# Patient Record
Sex: Female | Born: 1965 | Race: Black or African American | Hispanic: No | Marital: Single | Smoking: Former smoker
Health system: Southern US, Community
[De-identification: ages and names within clinical notes are randomized; demographics above are authoritative.]

## PROBLEM LIST (undated history)

## (undated) DIAGNOSIS — J302 Other seasonal allergic rhinitis: Secondary | ICD-10-CM

## (undated) DIAGNOSIS — K219 Gastro-esophageal reflux disease without esophagitis: Secondary | ICD-10-CM

## (undated) DIAGNOSIS — M329 Systemic lupus erythematosus, unspecified: Secondary | ICD-10-CM

## (undated) DIAGNOSIS — J8410 Pulmonary fibrosis, unspecified: Secondary | ICD-10-CM

## (undated) DIAGNOSIS — D472 Monoclonal gammopathy: Secondary | ICD-10-CM

## (undated) HISTORY — DX: Systemic lupus erythematosus, unspecified: M32.9

## (undated) HISTORY — DX: Gastro-esophageal reflux disease without esophagitis: K21.9

## (undated) HISTORY — DX: Pulmonary fibrosis, unspecified: J84.10

## (undated) HISTORY — PX: MULTIPLE TOOTH EXTRACTIONS: SHX2053

## (undated) HISTORY — DX: Monoclonal gammopathy: D47.2

## (undated) HISTORY — PX: HERNIA REPAIR: SHX51

## (undated) HISTORY — PX: CHOLECYSTECTOMY: SHX55

## (undated) HISTORY — DX: Other seasonal allergic rhinitis: J30.2

---

## 2008-06-29 DIAGNOSIS — M329 Systemic lupus erythematosus, unspecified: Secondary | ICD-10-CM

## 2008-06-29 HISTORY — DX: Systemic lupus erythematosus, unspecified: M32.9

## 2011-08-11 ENCOUNTER — Other Ambulatory Visit (HOSPITAL_COMMUNITY): Payer: Self-pay | Admitting: Rheumatology

## 2011-08-11 ENCOUNTER — Ambulatory Visit (HOSPITAL_COMMUNITY)
Admission: RE | Admit: 2011-08-11 | Discharge: 2011-08-11 | Disposition: A | Discharge status: Home / Self Care | Payer: BC Managed Care – PPO | Source: Ambulatory Visit | Attending: Rheumatology | Admitting: Rheumatology

## 2011-08-11 DIAGNOSIS — R0602 Shortness of breath: Secondary | ICD-10-CM | POA: Insufficient documentation

## 2011-08-11 DIAGNOSIS — Z0189 Encounter for other specified special examinations: Secondary | ICD-10-CM

## 2011-12-01 ENCOUNTER — Encounter (HOSPITAL_COMMUNITY): Payer: BC Managed Care – PPO | Attending: Oncology

## 2011-12-01 ENCOUNTER — Encounter (HOSPITAL_COMMUNITY): Payer: Self-pay

## 2011-12-01 ENCOUNTER — Ambulatory Visit (HOSPITAL_COMMUNITY)
Admission: RE | Admit: 2011-12-01 | Discharge: 2011-12-01 | Disposition: A | Discharge status: Home / Self Care | Payer: BC Managed Care – PPO | Source: Ambulatory Visit | Attending: Oncology | Admitting: Oncology

## 2011-12-01 VITALS — BP 155/96 | HR 55 | Temp 98.1°F | Ht 62.0 in | Wt 127.3 lb

## 2011-12-01 DIAGNOSIS — Z79899 Other long term (current) drug therapy: Secondary | ICD-10-CM | POA: Insufficient documentation

## 2011-12-01 DIAGNOSIS — IMO0002 Reserved for concepts with insufficient information to code with codable children: Secondary | ICD-10-CM | POA: Insufficient documentation

## 2011-12-01 DIAGNOSIS — Z7982 Long term (current) use of aspirin: Secondary | ICD-10-CM | POA: Insufficient documentation

## 2011-12-01 DIAGNOSIS — M069 Rheumatoid arthritis, unspecified: Secondary | ICD-10-CM | POA: Insufficient documentation

## 2011-12-01 DIAGNOSIS — R911 Solitary pulmonary nodule: Secondary | ICD-10-CM

## 2011-12-01 DIAGNOSIS — D472 Monoclonal gammopathy: Secondary | ICD-10-CM | POA: Insufficient documentation

## 2011-12-01 DIAGNOSIS — M5030 Other cervical disc degeneration, unspecified cervical region: Secondary | ICD-10-CM | POA: Insufficient documentation

## 2011-12-01 DIAGNOSIS — M329 Systemic lupus erythematosus, unspecified: Secondary | ICD-10-CM | POA: Insufficient documentation

## 2011-12-01 DIAGNOSIS — J8410 Pulmonary fibrosis, unspecified: Secondary | ICD-10-CM | POA: Insufficient documentation

## 2011-12-01 HISTORY — DX: Monoclonal gammopathy: D47.2

## 2011-12-01 NOTE — Progress Notes (Signed)
[name]  [phone] [phone]     INITIAL HEMATOLOGY CONSULTATION    Referral MD:  Dr. Delma Officer, M.D.   Reason for Referral: positive M-spike.     HPI: Kari Henson is a 46 year-old [place] American woman with history of SLE.  She had symptoms of wrist pain and skin rash in 2008.  She was diagnosed in 2010.  She was on MTX until 08/2011 when it was discontinued due to cytopenia, anorexia, weight loss.  She has been on Prednisone and Plaquenil.  She recently was found to have a routine myeloma work up that is routinely performed.  Her SPEP was negative; however, IFIX showed positive IgM.  She was thus kindly referred to the [facility] for evaluation.  Dr. Tressie Stalker was out of the office today; and I saw the patient in his absence.    Kari Henson presented to the clinic today with her mother.  She reported that her bilateral wrists and ankles pain have significantly improved. 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  Being off of work and on medications, these symptoms have significantly improved.  She no longer has any more skin rash on the back or her face.  She used to have nausea/vomiting and weight loss on MTX which have completely resolved being off of MTX.  She has occasional cough with productive phlegm which was attributed to her history of smoking up until 3 months ago.  She denied fever, SOB, DOE, chest pain, hemoptysis.    Patient denies fatigue, headache, visual changes, confusion, drenching night sweats, palpable lymph node swelling, mucositis, odynophagia, dysphagia, nausea vomiting, jaundice, gum bleeding, epistaxis, hematemesis, hemoptysis, abdominal pain, abdominal swelling, early satiety, melena, hematochezia, hematuria, skin rash, spontaneous bleeding, joint swelling, joint pain, heat or cold intolerance, bowel bladder incontinence, back pain, focal motor weakness, paresthesia, depression,  suicidal or homocidal ideation, feeling hopelessness.   Past Medical History  Diagnosis Date  . SLE (systemic lupus erythematosus) 2010    she was on MTX (d/c due to cytopenia).  She has been on Prednisone and Plaquenil  . Seasonal allergies   . 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 Lung nodule     follows with a Dr. Ouida Sills, pulmonary in [place], [place]  :    Past Surgical History  Procedure Date  . Cesarean section   . Hernia repair   . Multiple tooth extractions   :   CURRENT MEDS: Current Outpatient Prescriptions  Medication Sig Dispense Refill  . aspirin EC 81 MG tablet Take 81 mg by mouth daily.      . folic acid (FOLVITE) 1 MG tablet Take 2 mg by mouth daily. Take two 19m tablets daily      . hydroxychloroquine (PLAQUENIL) 200 MG tablet Take 200 mg by mouth 2 (two) times daily. Take one tablet in the am and 1/2 tablet in the pm      . predniSONE (DELTASONE) 5 MG tablet Take 5 mg by mouth daily.          No Known Allergies:  Family History  Problem Relation Age of Onset  . Asthma Mother   . Thyroid disease Mother   . Diabetes Mother   . COPD Mother   . Hypertension Mother   . Cirrhosis Father   . Alcohol abuse Father   . Diabetes Maternal Aunt   . Diabetes Maternal Uncle   . Diabetes Paternal  Aunt   . Hypertension Maternal Grandmother   . COPD Maternal Grandmother   . Arthritis Maternal Grandmother     rheumatoid arthritis  . Hypertension Sister   :  History   Social History  . Marital Status: Single    Spouse Name: N/A    Number of Children: 1  . Years of Education: N/A   Occupational History  .      used to work in [employer]; now applying for disability due to [place] History Main Topics  . Smoking status: Former Smoker -- 20 years    Quit date: 08/31/2011  . Smokeless tobacco: Never Used  . Alcohol Use: No  . Drug Use: No  . Sexually Active: Yes   Other Topics Concern  . Not on file   Social History Narrative    . No narrative on file  :  REVIEW OF SYSTEM:  The rest of the 14-point review of sytem was negative.   Exam: ECOG 0.   General:  Thin-appearing woman, in no acute distress.  Eyes:  no scleral icterus.  ENT:  There were no oropharyngeal lesions.  Neck was without thyromegaly.  Lymphatics:  Negative cervical, supraclavicular or axillary adenopathy.  Respiratory: lungs were clear bilaterally without wheezing or crackles.  Cardiovascular:  Regular rate and rhythm, S1/S2, without murmur, rub or gallop.  There was no pedal edema.  GI:  abdomen was soft, flat, nontender, nondistended, without organomegaly.  Muscoloskeletal:  no spinal tenderness of palpation of vertebral spine. There was no swelling or pain to palpation of her hands.  Skin exam was without echymosis, petichae.  There was hyperpigmented rash that had healed on her back and upper lip.   Neuro exam was nonfocal.  Patient was able to get on and off exam table without assistance.  Gait was normal.  Patient was alerted and oriented.  Attention was good.   Language was appropriate.  Mood was normal without depression.  Speech was not pressured.  Thought content was not tangential.    LABS:  From Dr. Arlean Hopping offfice:  ESR 66 mm/hr (ref 0-22) 11/04/11 Cr 0.56 mg/dL (ref 0.6-1.3) 10/16/11 WBC 3.8; Hgb 11.7; plt 310 (10/16/11) SPEP:  Negative; IFIX positive for IgM monoclonal protein with kappa light chain restriction (08/26/11).  Ca 9.8 mg/dL (ref 8.4-10.5);  Tprot 8.1 g/dL (ref 6-8.3)   ASSESSMENT AND PLAN:   1.  SLE:  On Prednisone and Plaquenil with Dr. Estanislado Pandy with symptomatic improvement.   2.  Lung nodule:  She had a recent CT chest (outside of [facility] system).  She has f/u with Dr. Ouida Sills, a pulmonologist in [place] next week for result.   3.  Issue:  Positive monoclonal protein in blood.   -Diagnosis:  Most likely due to rheumatoid arthritis; monoclonal gammopathy of undetermined significance (MGUS).  Currently no sign of active  multiple myeloma.  She does not have anemia, renal insufficiency, or hypercalcemia.  There was no B-symptoms or adenopathy to raise high suspicion for lymphoplasmacytic lymphoma.  - Work up:  I sent for serum kappa light chain; and 24 hour urine collection to see if there is significant amount of monoclonal protein in urine.   Also skeletal X-ray to rule out lytic bone lesions. -Treatment:  None needed at this time as there is no active multiple myeloma.   In the future, if there are signs of active myeloma (anemia, high calcium, kidney disease; or rapid increase in monoclonal protein), then a bone marrow biopsy may  be indicated.  - Follow up with lab and return visit with Dr. Tressie Stalker in about 5 months.  Ms. Campo and her mother asked appropriate questions and agree with watchful observation at this time.    Thank you for this referral.     The length of time of the face-to-face encounter was 45 minutes. More than 50% of time was spent counseling and coordination of care.

## 2011-12-01 NOTE — Patient Instructions (Signed)
A.  Issue:  Positive monoclonal protein in blood.  Diagnosis:  Most likely due to rheumatoid arthritis; monoclonal gammopathy of undetermined significance (MGUS).  Currently no sign of active multiple myeloma.  B.  Recommendation: - Work up:  24 hour urine collection to see if there is significant amount of monoclonal protein in urine.   Also skeletal X-ray to rule out lytic bone lesions. - In the future, if there are signs of active myeloma (anemia, high calcium, kidney disease; or rapid increase in monoclonal protein), then a bone marrow biopsy may be indicated.  - Follow up in about 5 months (with lab the week prior).

## 2011-12-03 ENCOUNTER — Encounter (HOSPITAL_BASED_OUTPATIENT_CLINIC_OR_DEPARTMENT_OTHER): Payer: BC Managed Care – PPO

## 2011-12-03 DIAGNOSIS — D472 Monoclonal gammopathy: Secondary | ICD-10-CM

## 2011-12-03 NOTE — Progress Notes (Signed)
Kari Henson presented for labwork. Labs per MD order drawn via Peripheral Line 23 gauge needle inserted in left antecubital.  Good blood return present. Procedure without incident.  Needle removed intact. Patient tolerated procedure well.

## 2011-12-07 ENCOUNTER — Telehealth: Payer: Self-pay | Admitting: Oncology

## 2011-12-07 LAB — UIFE/LIGHT CHAINS/TP QN, 24-HR UR
Albumin, U: DETECTED
Beta, Urine: NOT DETECTED
Free Lambda Lt Chains,Ur: <0.03 mg/dL (ref 0.02–0.67)
Time: 24 hours

## 2011-12-07 NOTE — Telephone Encounter (Signed)
I called and left a message on pt's home phone.  Her work up did not show evidence of active multiple myeloma (including 24 hour urine and skeletal survey).  Her diagnosed is thus confirmed as MGUS (monoclonal gammopathy of undetermined significance) (precursor to myeloma:  1/4 patients with MGUS may develop active myeloma with observation).   There is no need for further work up.  I recommended watchful observation.  She has f/u appointment with Dr. Tressie Stalker in about 5 months.

## 2012-05-02 ENCOUNTER — Other Ambulatory Visit (HOSPITAL_COMMUNITY): Payer: BC Managed Care – PPO

## 2012-05-04 ENCOUNTER — Ambulatory Visit (HOSPITAL_COMMUNITY): Payer: BC Managed Care – PPO | Admitting: Oncology

## 2014-01-18 ENCOUNTER — Other Ambulatory Visit (HOSPITAL_COMMUNITY): Payer: Self-pay | Admitting: Physician Assistant

## 2014-01-18 DIAGNOSIS — R222 Localized swelling, mass and lump, trunk: Secondary | ICD-10-CM

## 2014-01-23 ENCOUNTER — Ambulatory Visit (HOSPITAL_COMMUNITY)
Admission: RE | Admit: 2014-01-23 | Discharge: 2014-01-23 | Disposition: A | Discharge status: Home / Self Care | Payer: Self-pay | Source: Ambulatory Visit | Attending: Physician Assistant | Admitting: Physician Assistant

## 2014-01-23 ENCOUNTER — Other Ambulatory Visit (HOSPITAL_COMMUNITY): Payer: Self-pay | Admitting: Physician Assistant

## 2014-01-23 DIAGNOSIS — M329 Systemic lupus erythematosus, unspecified: Secondary | ICD-10-CM | POA: Insufficient documentation

## 2014-01-23 DIAGNOSIS — Z87891 Personal history of nicotine dependence: Secondary | ICD-10-CM | POA: Insufficient documentation

## 2014-01-23 DIAGNOSIS — Z1231 Encounter for screening mammogram for malignant neoplasm of breast: Secondary | ICD-10-CM

## 2014-01-23 DIAGNOSIS — R222 Localized swelling, mass and lump, trunk: Secondary | ICD-10-CM

## 2014-01-23 DIAGNOSIS — J984 Other disorders of lung: Secondary | ICD-10-CM | POA: Insufficient documentation

## 2014-01-24 ENCOUNTER — Ambulatory Visit (HOSPITAL_COMMUNITY): Payer: Self-pay

## 2014-01-24 ENCOUNTER — Ambulatory Visit (HOSPITAL_COMMUNITY): Payer: BC Managed Care – PPO

## 2014-01-29 ENCOUNTER — Ambulatory Visit (HOSPITAL_COMMUNITY)
Admission: RE | Admit: 2014-01-29 | Discharge: 2014-01-29 | Disposition: A | Discharge status: Home / Self Care | Payer: Self-pay | Source: Ambulatory Visit | Attending: Physician Assistant | Admitting: Physician Assistant

## 2014-01-29 DIAGNOSIS — Z1231 Encounter for screening mammogram for malignant neoplasm of breast: Secondary | ICD-10-CM

## 2014-02-14 ENCOUNTER — Encounter (HOSPITAL_COMMUNITY): Payer: Self-pay | Admitting: Emergency Medicine

## 2014-02-14 ENCOUNTER — Emergency Department (HOSPITAL_COMMUNITY)
Admission: EM | Admit: 2014-02-14 | Discharge: 2014-02-14 | Disposition: A | Discharge status: Home / Self Care | Payer: Self-pay | Attending: Emergency Medicine | Admitting: Emergency Medicine

## 2014-02-14 DIAGNOSIS — Z862 Personal history of diseases of the blood and blood-forming organs and certain disorders involving the immune mechanism: Secondary | ICD-10-CM | POA: Insufficient documentation

## 2014-02-14 DIAGNOSIS — R1011 Right upper quadrant pain: Secondary | ICD-10-CM

## 2014-02-14 DIAGNOSIS — Z8739 Personal history of other diseases of the musculoskeletal system and connective tissue: Secondary | ICD-10-CM | POA: Insufficient documentation

## 2014-02-14 DIAGNOSIS — R110 Nausea: Secondary | ICD-10-CM | POA: Insufficient documentation

## 2014-02-14 DIAGNOSIS — Z87891 Personal history of nicotine dependence: Secondary | ICD-10-CM | POA: Insufficient documentation

## 2014-02-14 DIAGNOSIS — Z8709 Personal history of other diseases of the respiratory system: Secondary | ICD-10-CM | POA: Insufficient documentation

## 2014-02-14 DIAGNOSIS — Z79899 Other long term (current) drug therapy: Secondary | ICD-10-CM | POA: Insufficient documentation

## 2014-02-14 DIAGNOSIS — Z8639 Personal history of other endocrine, nutritional and metabolic disease: Secondary | ICD-10-CM | POA: Insufficient documentation

## 2014-02-14 LAB — URINALYSIS, ROUTINE W REFLEX MICROSCOPIC
Bilirubin Urine: NEGATIVE
Glucose, UA: NEGATIVE mg/dL
HGB URINE DIPSTICK: NEGATIVE
Ketones, ur: NEGATIVE mg/dL
Leukocytes, UA: NEGATIVE
NITRITE: NEGATIVE
Protein, ur: NEGATIVE mg/dL
SPECIFIC GRAVITY, URINE: 1.025 (ref 1.005–1.030)
UROBILINOGEN UA: 0.2 mg/dL (ref 0.0–1.0)
pH: 6 (ref 5.0–8.0)

## 2014-02-14 LAB — CBC WITH DIFFERENTIAL/PLATELET
Basophils Absolute: 0 10*3/uL (ref 0.0–0.1)
Basophils Relative: 0 % (ref 0–1)
EOS ABS: 0.1 10*3/uL (ref 0.0–0.7)
EOS PCT: 2 % (ref 0–5)
HCT: 39.4 % (ref 36.0–46.0)
HEMOGLOBIN: 13 g/dL (ref 12.0–15.0)
LYMPHS ABS: 1.2 10*3/uL (ref 0.7–4.0)
Lymphocytes Relative: 21 % (ref 12–46)
MCH: 25.7 pg — AB (ref 26.0–34.0)
MCHC: 33 g/dL (ref 30.0–36.0)
MCV: 78 fL (ref 78.0–100.0)
Monocytes Absolute: 0.6 10*3/uL (ref 0.1–1.0)
Monocytes Relative: 11 % (ref 3–12)
NEUTROS PCT: 66 % (ref 43–77)
Neutro Abs: 3.7 10*3/uL (ref 1.7–7.7)
Platelets: 235 10*3/uL (ref 150–400)
RBC: 5.05 MIL/uL (ref 3.87–5.11)
RDW: 14.2 % (ref 11.5–15.5)
WBC: 5.6 10*3/uL (ref 4.0–10.5)

## 2014-02-14 LAB — COMPREHENSIVE METABOLIC PANEL
ALBUMIN: 3.7 g/dL (ref 3.5–5.2)
ALK PHOS: 76 U/L (ref 39–117)
ALT: 11 U/L (ref 0–35)
AST: 23 U/L (ref 0–37)
Anion gap: 12 (ref 5–15)
BUN: 5 mg/dL — ABNORMAL LOW (ref 6–23)
CO2: 26 mEq/L (ref 19–32)
Calcium: 9.1 mg/dL (ref 8.4–10.5)
Chloride: 101 mEq/L (ref 96–112)
Creatinine, Ser: 0.52 mg/dL (ref 0.50–1.10)
GFR calc Af Amer: >90 mL/min (ref >90)
GFR calc non Af Amer: >90 mL/min (ref >90)
Glucose, Bld: 99 mg/dL (ref 70–99)
POTASSIUM: 3.9 meq/L (ref 3.7–5.3)
SODIUM: 139 meq/L (ref 137–147)
TOTAL PROTEIN: 8.3 g/dL (ref 6.0–8.3)
Total Bilirubin: 0.2 mg/dL — ABNORMAL LOW (ref 0.3–1.2)

## 2014-02-14 LAB — LIPASE, BLOOD: Lipase: 37 U/L (ref 11–59)

## 2014-02-14 MED ORDER — ACETAMINOPHEN 500 MG PO TABS
1000.0000 mg | ORAL_TABLET | Freq: Once | ORAL | Status: AC
  Administered 2014-02-14: 1000 mg via ORAL
  Filled 2014-02-14: qty 2

## 2014-02-14 NOTE — ED Notes (Signed)
Pt states upper abdominal pain from both, gallstones and kidney stones. Pain x 2 hours. [facility] that if she began hurting, to come to the ED.

## 2014-02-14 NOTE — ED Provider Notes (Signed)
CSN: [id number]     Arrival date & time 02/14/14  1755 History   None    Chief Complaint  Patient presents with  . Abdominal Pain     (Consider location/radiation/quality/duration/timing/severity/associated sxs/prior Treatment) HPI Complains of right upper quadrant pain radiating to right flank onset 3 PM today after eating noodles with bladder. She vomited one time slight amount. She treated herself with Tylenol with relief. Pain is almost gone at present. Last bowel movement 4 PM today, normal. No other associated symptoms. No nausea at present. No fever. No shortness of breath. Past Medical History  Diagnosis Date  . SLE (systemic lupus erythematosus) 2010    she was on MTX (d/c due to cytopenia).  She has been on Prednisone and Plaquenil  . Seasonal allergies   . 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 Lung fibrosis     follows with a Dr. Ouida Sills, pulmonary in [place], [place]   Past Surgical History  Procedure Laterality Date  . Cesarean section    . Hernia repair    . Multiple tooth extractions     Family History  Problem Relation Age of Onset  . Asthma Mother   . Thyroid disease Mother   . Diabetes Mother   . COPD Mother   . Hypertension Mother   . Cirrhosis Father   . Alcohol abuse Father   . Diabetes Maternal Aunt   . Diabetes Maternal Uncle   . Diabetes Paternal Aunt   . Hypertension Maternal Grandmother   . COPD Maternal Grandmother   . Arthritis Maternal Grandmother     rheumatoid arthritis  . Hypertension Sister    History  Substance Use Topics  . Smoking status: Former Smoker -- 20 years    Quit date: 08/31/2011  . Smokeless tobacco: Never Used  . Alcohol Use: No   OB History   Grav Para Term Preterm Abortions TAB SAB Ect Mult Living                 Review of Systems  Gastrointestinal: Positive for nausea and abdominal pain.  All other systems reviewed and are negative.     Allergies  Review of patient's allergies  indicates no known allergies.  Home Medications   Prior to Admission medications   Medication Sig Start Date End Date Taking? Authorizing Provider  acetaminophen (TYLENOL) 500 MG tablet Take 500 mg by mouth every 6 (six) hours as needed.   Yes Historical Provider, MD  hydroxychloroquine (PLAQUENIL) 200 MG tablet Take 200 mg by mouth 2 (two) times daily. Take one tablet in the am and 1/2 tablet in the pm   Yes Historical Provider, MD  pantoprazole (PROTONIX) 40 MG tablet Take 40 mg by mouth every morning.   Yes Historical Provider, MD  simvastatin (ZOCOR) 20 MG tablet Take 20 mg by mouth at bedtime.   Yes Historical Provider, MD   BP 174/111  Pulse 83  Temp(Src) 98.4 F (36.9 C) (Oral)  Resp 20  Ht _0  (1.575 m)  Wt 160 lb (72.576 kg)  BMI 29.26 kg/m2  SpO2 100%  LMP 07/01/2013 Physical Exam  Nursing note and vitals reviewed. Constitutional: She appears well-developed and well-nourished.  HENT:  Head: Normocephalic and atraumatic.  Eyes: Conjunctivae are normal. Pupils are equal, round, and reactive to light.  Neck: Neck supple. No tracheal deviation present. No thyromegaly present.  Cardiovascular: Normal rate and regular rhythm.   No murmur heard. Pulmonary/Chest: Effort normal and breath sounds normal.  Abdominal:  Soft. Bowel sounds are normal. She exhibits no distension and no mass. There is tenderness. There is no rebound and no guarding.   Tender right upper quadrant  Musculoskeletal: Normal range of motion. She exhibits no edema and no tenderness.  Neurological: She is alert. Coordination normal.  Skin: Skin is warm and dry. No rash noted.  Psychiatric: She has a normal mood and affect.    ED Course  Procedures (including critical care time) Labs Review Labs Reviewed  URINALYSIS, ROUTINE W REFLEX MICROSCOPIC    Imaging Review No results found.   EKG Interpretation None     Declined pain medicine. At 9:30 PM patient states discomfort is minimal. She is  requesting Tylenol for pain. Ordered by me. Results for orders placed during the hospital encounter of 02/14/14  URINALYSIS, ROUTINE W REFLEX MICROSCOPIC      Result Value Ref Range   Color, Urine YELLOW  YELLOW   APPearance CLEAR  CLEAR   Specific Gravity, Urine 1.025  1.005 - 1.030   pH 6.0  5.0 - 8.0   Glucose, UA NEGATIVE  NEGATIVE mg/dL   Hgb urine dipstick NEGATIVE  NEGATIVE   Bilirubin Urine NEGATIVE  NEGATIVE   Ketones, ur NEGATIVE  NEGATIVE mg/dL   Protein, ur NEGATIVE  NEGATIVE mg/dL   Urobilinogen, UA 0.2  0.0 - 1.0 mg/dL   Nitrite NEGATIVE  NEGATIVE   Leukocytes, UA NEGATIVE  NEGATIVE  COMPREHENSIVE METABOLIC PANEL      Result Value Ref Range   Sodium 139  137 - 147 mEq/L   Potassium 3.9  3.7 - 5.3 mEq/L   Chloride 101  96 - 112 mEq/L   CO2 26  19 - 32 mEq/L   Glucose, Bld 99  70 - 99 mg/dL   BUN 5 (*) 6 - 23 mg/dL   Creatinine, Ser 0.52  0.50 - 1.10 mg/dL   Calcium 9.1  8.4 - 10.5 mg/dL   Total Protein 8.3  6.0 - 8.3 g/dL   Albumin 3.7  3.5 - 5.2 g/dL   AST 23  0 - 37 U/L   ALT 11  0 - 35 U/L   Alkaline Phosphatase 76  39 - 117 U/L   Total Bilirubin 0.2 (*) 0.3 - 1.2 mg/dL   GFR calc non Af Amer >90  >90 mL/min   GFR calc Af Amer >90  >90 mL/min   Anion gap 12  5 - 15  LIPASE, BLOOD      Result Value Ref Range   Lipase 37  11 - 59 U/L  CBC WITH DIFFERENTIAL      Result Value Ref Range   WBC 5.6  4.0 - 10.5 K/uL   RBC 5.05  3.87 - 5.11 MIL/uL   Hemoglobin 13.0  12.0 - 15.0 g/dL   HCT 39.4  36.0 - 46.0 %   MCV 78.0  78.0 - 100.0 fL   MCH 25.7 (*) 26.0 - 34.0 pg   MCHC 33.0  30.0 - 36.0 g/dL   RDW 14.2  11.5 - 15.5 %   Platelets 235  150 - 400 K/uL   Neutrophils Relative % 66  43 - 77 %   Neutro Abs 3.7  1.7 - 7.7 K/uL   Lymphocytes Relative 21  12 - 46 %   Lymphs Abs 1.2  0.7 - 4.0 K/uL   Monocytes Relative 11  3 - 12 %   Monocytes Absolute 0.6  0.1 - 1.0 K/uL   Eosinophils Relative 2  0 - 5 %   Eosinophils Absolute 0.1  0.0 - 0.7 K/uL   Basophils  Relative 0  0 - 1 %   Basophils Absolute 0.0  0.0 - 0.1 K/uL   Ct Chest Wo Contrast  01/23/2014   CLINICAL DATA:  History of lupus.  Lung scarring.  Former smoker.  EXAM: CT CHEST WITHOUT CONTRAST  TECHNIQUE: Multidetector CT imaging of the chest was performed following the standard protocol without IV contrast.  COMPARISON:  Chest CT 10/26/2011.  FINDINGS: Mediastinum: Heart size is normal. There is no significant pericardial fluid, thickening or pericardial calcification. Multiple borderline enlarged mediastinal lymph nodes, similar to the prior study, presumably chronic and reactive. No pathologically enlarged mediastinal or hilar lymph nodes. Please note that accurate exclusion of hilar adenopathy is limited on noncontrast CT scans. Esophagus is unremarkable in appearance.  Lungs/Pleura: Patchy areas of ground-glass attenuation are again noted, most pronounced throughout the mid to lower lungs bilaterally. These are associated with areas of extensive thickening of the peribronchovascular interstitium, most evident in the lower lobes of the lungs bilaterally, and in the inferior segment of the lingula, where there is some mild cylindrical bronchiectasis. In addition, there are patchy areas of subpleural and peribronchovascular reticulation, most severe in the upper lobes of the lungs bilaterally, particularly in the anterior aspects of the upper lobes. Some associated honeycombing is noted in the regions of greatest involvement, particularly in the anterior aspect of the left upper lobe. The overall appearance is very similar to the prior examination from 10/26/2011. No new acute consolidative airspace disease. No pleural effusions. No suspicious appearing pulmonary nodules or masses.  Upper Abdomen: Unremarkable.  Musculoskeletal: There are no aggressive appearing lytic or blastic lesions noted in the visualized portions of the skeleton.  IMPRESSION: 1. 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 No significant progression compared to the prior examination.   Electronically Signed   By: Vinnie Langton M.D.   On: 01/23/2014 08:44   Ms Digital Screening Bilateral  01/31/2014   CLINICAL DATA:  Screening.  EXAM: DIGITAL SCREENING BILATERAL MAMMOGRAM WITH CAD  COMPARISON:  Previous exam(s).  ACR Breast Density Category c: The breast tissue is heterogeneously dense, which may obscure small masses.  FINDINGS: There are no findings suspicious for malignancy. Images were processed with CAD.  IMPRESSION: No mammographic evidence of malignancy. A result letter of this screening mammogram will be mailed directly to the patient.  RECOMMENDATION: Screening mammogram in one year. (Code:SM-B-01Y)  BI-RADS CATEGORY  1: Negative.   Electronically Signed   By: Skipper Cliche M.D.   On: 01/31/2014 16:43    MDM  Final diagnoses:  None   Symptoms and exam consistent with biliary colic Plan outpatient right upper quadrant ultrasound ordered for tomorrow. Blood pressure recheck within 3 weeks Diagnosis#1 abdominal pain right upper quadrant #2 elevated blood pressure     Orlie Dakin, MD 02/14/14 2134

## 2014-02-14 NOTE — Discharge Instructions (Signed)
Go to the outpatient x-ray department at 1:45 PM tomorrow to get your ultrasound. Do not eat or drink anything after midnight tonight. You may take any medication that you need with a sip of water. It is okay to take Tylenol for pain as directed. Get your blood pressure rechecked within the next 3 weeks. Today's was slightly elevated at 150/78

## 2014-02-15 ENCOUNTER — Ambulatory Visit (HOSPITAL_COMMUNITY)
Admit: 2014-02-15 | Discharge: 2014-02-15 | Disposition: A | Discharge status: Home / Self Care | Payer: Self-pay | Source: Ambulatory Visit | Attending: Emergency Medicine | Admitting: Emergency Medicine

## 2014-02-15 ENCOUNTER — Other Ambulatory Visit (HOSPITAL_COMMUNITY): Payer: Self-pay | Admitting: Emergency Medicine

## 2014-02-15 DIAGNOSIS — R1011 Right upper quadrant pain: Secondary | ICD-10-CM

## 2014-02-15 DIAGNOSIS — K838 Other specified diseases of biliary tract: Secondary | ICD-10-CM | POA: Insufficient documentation

## 2014-02-15 NOTE — ED Provider Notes (Signed)
U/S shows mild sludge in gallbladder. No cholecystitis or stones. Updated patient on results. Referred back to PCP. Discussed return precautions, such as fever, vomiting, persistent/severe abd pain, etc.  Ephraim Hamburger, MD 02/15/14 [phone]

## 2014-02-27 ENCOUNTER — Ambulatory Visit: Payer: Self-pay | Admitting: Gastroenterology

## 2014-02-27 ENCOUNTER — Encounter: Payer: Self-pay | Admitting: Gastroenterology

## 2014-03-29 ENCOUNTER — Encounter: Payer: Self-pay | Admitting: Gastroenterology

## 2014-03-29 ENCOUNTER — Ambulatory Visit (INDEPENDENT_AMBULATORY_CARE_PROVIDER_SITE_OTHER): Payer: Self-pay | Admitting: Gastroenterology

## 2014-03-29 VITALS — BP 123/85 | HR 81 | Temp 97.6°F | Ht 62.0 in | Wt 159.6 lb

## 2014-03-29 DIAGNOSIS — Z1211 Encounter for screening for malignant neoplasm of colon: Secondary | ICD-10-CM | POA: Insufficient documentation

## 2014-03-29 DIAGNOSIS — R1013 Epigastric pain: Secondary | ICD-10-CM

## 2014-03-29 NOTE — Assessment & Plan Note (Signed)
48 year old female with several month history of epigastric pain that has improved significantly with cessation of fried foods. Remains on Protonix daily. Gallbladder remains in situ. One episode of slightly elevated AST at 72 and alk phos 155 in August; normal LFTs several days prior in ED. Need most recent labs, as patient stated updated labs last week. Concern for biliary etiology but unable to exclude gastritis, PUD. Proceed with HIDA, obtain outside labs. Needs initial screening colonoscopy with possible EGD after review of HIDA and labs.

## 2014-03-29 NOTE — Progress Notes (Signed)
Primary Care Physician:  Montey Hora Primary Gastroenterologist:  Dr. Oneida Alar   Chief Complaint  Patient presents with  . Abdominal Pain    HPI:   Kari Henson presents today at the request of her PCP secondary to abdominal pain and increased LFTs. Acute onset of epigastric pain in July 2015, was eating fried food at the time. Pain much improved. Has a little ache intermittently. Will take tylenol and relieve symptoms. While eating fried food stayed sick but now resolved. No reflux on Protonix daily. No fever/chills. No dysphagia. No melena or hematochezia. No changes in bowel habits, constipation, or diarrhea.   No prior EGD or colonoscopy.   Past Medical History  Diagnosis Date  . SLE (systemic lupus erythematosus) 2010    she was on MTX (d/c due to cytopenia).  She has been on Prednisone and Plaquenil  . Seasonal allergies   . 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 Lung fibrosis     follows with a Dr. Ouida Sills, pulmonary in [place], [place]  . GERD (gastroesophageal reflux disease)     Past Surgical History  Procedure Laterality Date  . Cesarean section    . Hernia repair    . Multiple tooth extractions      Current Outpatient Prescriptions  Medication Sig Dispense Refill  . acetaminophen (TYLENOL) 500 MG tablet Take 500 mg by mouth every 6 (six) hours as needed.      . citalopram (CELEXA) 20 MG tablet Take 20 mg by mouth daily.      . pantoprazole (PROTONIX) 40 MG tablet Take 40 mg by mouth every morning.      . simvastatin (ZOCOR) 20 MG tablet Take 20 mg by mouth at bedtime.       No current facility-administered medications for this visit.    Allergies as of 03/29/2014  . (No Known Allergies)    Family History  Problem Relation Age of Onset  . Asthma Mother   . Thyroid disease Mother   . Diabetes Mother   . COPD Mother   . Hypertension Mother   . Cirrhosis Father   . Alcohol abuse Father   . Diabetes Maternal Aunt    . Diabetes Maternal Uncle   . Diabetes Paternal Aunt   . Hypertension Maternal Grandmother   . COPD Maternal Grandmother   . Arthritis Maternal Grandmother     rheumatoid arthritis  . Hypertension Sister   . Colon cancer Neg Hx     History   Social History  . Marital Status: Single    Spouse Name: N/A    Number of Children: 1  . Years of Education: N/A   Occupational History  .      used to work in [employer]; now applying for disability due to [place] History Main Topics  . Smoking status: Former Smoker -- 20 years    Quit date: 08/31/2011  . Smokeless tobacco: Never Used  . Alcohol Use: No  . Drug Use: No  . Sexual Activity: Yes   Other Topics Concern  . Not on file   Social History Narrative  . No narrative on file    Review of Systems: Gen: fair appetite CV: Denies chest pain, heart palpitations, peripheral edema, syncope.  Resp: Denies shortness of breath at rest or with exertion. Denies wheezing or cough.  GI: see HPI GU : Denies urinary burning, urinary frequency, urinary hesitancy MS: +joint pain Derm: Denies rash, itching,  dry skin Psych: Denies depression, anxiety, memory loss, and confusion Heme: Denies bruising, bleeding, and enlarged lymph nodes.  Physical Exam: BP 123/85  Pulse 81  Temp(Src) 97.6 F (36.4 C) (Oral)  Ht _0  (1.575 m)  Wt 159 lb 9.6 oz (72.394 kg)  BMI 29.18 kg/m2 General:   Alert and oriented. Pleasant and cooperative. Well-nourished and well-developed.  Head:  Normocephalic and atraumatic. Eyes:  Without icterus, sclera clear and conjunctiva pink.  Ears:  Normal auditory acuity. Nose:  No deformity, discharge,  or lesions. Mouth:  No deformity or lesions, oral mucosa pink.  Lungs:  Clear to auscultation bilaterally. No wheezes, rales, or rhonchi. No distress.  Heart:  S1, S2 present without murmurs appreciated.  Abdomen:  +BS, soft, mild TTP epigastric and non-distended. No HSM noted. No guarding or rebound. No masses  appreciated.  Rectal:  Deferred  Msk:  Symmetrical without gross deformities. Normal posture. Extremities:  Without  edema. Neurologic:  Alert and  oriented x4;  grossly normal neurologically. Skin:  Intact without significant lesions or rashes. Psych:  Alert and cooperative. Normal mood and affect.   Aug 2015 [place] of abdomen IMPRESSION:  Small amount of echogenic gallbladder debris/sludge. No other  significant finding by ultrasound.  Lab Results  Component Value Date   ALT 11 02/14/2014   AST 23 02/14/2014   ALKPHOS 76 02/14/2014   BILITOT 0.2* 02/14/2014   Outside labs 8/24:  ALT 23, AST 72, Alk Phos 155, Tbili 0.5

## 2014-03-29 NOTE — Assessment & Plan Note (Signed)
No concerning lower GI symptoms or family history of colon cancer. Proceed with colonoscopy with Dr. Oneida Alar in the near future. The risks, benefits, and alternatives have been discussed in detail with the patient. They state understanding and desire to proceed.

## 2014-03-29 NOTE — Progress Notes (Signed)
Pt is set up for a HIDA scan on 04/04/14 @ 8:00. Pt is aware of appointment and time.

## 2014-03-29 NOTE — Patient Instructions (Signed)
I am requesting blood work from [facility]. I would like to review this.   I have ordered a HIDA scan, which will further evaluate your gallbladder. Once this is reviewed, we will set you up for a colonoscopy with a possible upper endoscopy with Dr. Oneida Alar.   Continue Protonix once daily. Continue the good diet choices you are doing!

## 2014-03-30 NOTE — Progress Notes (Signed)
cc'ed to pcp °

## 2014-04-04 ENCOUNTER — Encounter (HOSPITAL_COMMUNITY)
Admission: RE | Admit: 2014-04-04 | Discharge: 2014-04-04 | Disposition: A | Discharge status: Home / Self Care | Payer: Self-pay | Source: Ambulatory Visit | Attending: Diagnostic Radiology | Admitting: Diagnostic Radiology

## 2014-04-04 ENCOUNTER — Encounter (HOSPITAL_COMMUNITY): Payer: Self-pay

## 2014-04-04 DIAGNOSIS — R1013 Epigastric pain: Secondary | ICD-10-CM | POA: Insufficient documentation

## 2014-04-04 DIAGNOSIS — R109 Unspecified abdominal pain: Secondary | ICD-10-CM | POA: Insufficient documentation

## 2014-04-04 MED ORDER — SODIUM CHLORIDE 0.9 % IJ SOLN
INTRAMUSCULAR | Status: AC
  Filled 2014-04-04: qty 30

## 2014-04-04 MED ORDER — SINCALIDE 5 MCG IJ SOLR
INTRAMUSCULAR | Status: AC
  Administered 2014-04-04: 1.45 ug
  Filled 2014-04-04: qty 5

## 2014-04-04 MED ORDER — TECHNETIUM TC 99M MEBROFENIN IV KIT
5.0000 | PACK | Freq: Once | INTRAVENOUS | Status: AC | PRN
Start: 2014-04-04 — End: 2014-04-04
  Administered 2014-04-04: 5 via INTRAVENOUS

## 2014-04-04 MED ORDER — STERILE WATER FOR INJECTION IJ SOLN
INTRAMUSCULAR | Status: AC
  Administered 2014-04-04: 09:00:00
  Filled 2014-04-04: qty 10

## 2014-04-16 ENCOUNTER — Other Ambulatory Visit: Payer: Self-pay

## 2014-04-16 DIAGNOSIS — Z1211 Encounter for screening for malignant neoplasm of colon: Secondary | ICD-10-CM

## 2014-04-16 DIAGNOSIS — R1013 Epigastric pain: Secondary | ICD-10-CM

## 2014-04-16 MED ORDER — PEG-KCL-NACL-NASULF-NA ASC-C 100 G PO SOLR: 1.0000 | ORAL | Status: DC

## 2014-04-16 NOTE — Progress Notes (Signed)
Quick Note:  HIDA normal.  Needs TCS/EGD with Dr. Oneida Alar. Reason: screening colonoscopy and EGD is due to dyspepsia. ______

## 2014-04-16 NOTE — Progress Notes (Signed)
Quick Note:  Unable to reach pt by phone. Mailed a letter that HIDA was normal. ______

## 2014-04-20 ENCOUNTER — Encounter (HOSPITAL_COMMUNITY): Payer: Self-pay | Admitting: Pharmacy Technician

## 2014-04-27 ENCOUNTER — Encounter (HOSPITAL_COMMUNITY)
Admission: RE | Disposition: A | Discharge status: Home / Self Care | Payer: Self-pay | Source: Ambulatory Visit | Attending: Gastroenterology

## 2014-04-27 ENCOUNTER — Encounter (HOSPITAL_COMMUNITY): Payer: Self-pay | Admitting: Gastroenterology

## 2014-04-27 ENCOUNTER — Ambulatory Visit (HOSPITAL_COMMUNITY)
Admission: RE | Admit: 2014-04-27 | Discharge: 2014-04-27 | Disposition: A | Discharge status: Home / Self Care | Payer: Self-pay | Source: Ambulatory Visit | Attending: Gastroenterology | Admitting: Gastroenterology

## 2014-04-27 DIAGNOSIS — Z79899 Other long term (current) drug therapy: Secondary | ICD-10-CM | POA: Insufficient documentation

## 2014-04-27 DIAGNOSIS — K219 Gastro-esophageal reflux disease without esophagitis: Secondary | ICD-10-CM | POA: Insufficient documentation

## 2014-04-27 DIAGNOSIS — A630 Anogenital (venereal) warts: Secondary | ICD-10-CM | POA: Insufficient documentation

## 2014-04-27 DIAGNOSIS — Z87891 Personal history of nicotine dependence: Secondary | ICD-10-CM | POA: Insufficient documentation

## 2014-04-27 DIAGNOSIS — Z1211 Encounter for screening for malignant neoplasm of colon: Secondary | ICD-10-CM | POA: Insufficient documentation

## 2014-04-27 DIAGNOSIS — Q438 Other specified congenital malformations of intestine: Secondary | ICD-10-CM | POA: Insufficient documentation

## 2014-04-27 DIAGNOSIS — K2900 Acute gastritis without bleeding: Secondary | ICD-10-CM | POA: Insufficient documentation

## 2014-04-27 DIAGNOSIS — D013 Carcinoma in situ of anus and anal canal: Secondary | ICD-10-CM | POA: Insufficient documentation

## 2014-04-27 DIAGNOSIS — K2970 Gastritis, unspecified, without bleeding: Secondary | ICD-10-CM

## 2014-04-27 DIAGNOSIS — K648 Other hemorrhoids: Secondary | ICD-10-CM | POA: Insufficient documentation

## 2014-04-27 DIAGNOSIS — R1013 Epigastric pain: Secondary | ICD-10-CM

## 2014-04-27 DIAGNOSIS — D128 Benign neoplasm of rectum: Secondary | ICD-10-CM

## 2014-04-27 HISTORY — PX: COLONOSCOPY: SHX5424

## 2014-04-27 HISTORY — PX: ESOPHAGOGASTRODUODENOSCOPY: SHX5428

## 2014-04-27 SURGERY — EGD (ESOPHAGOGASTRODUODENOSCOPY)
Anesthesia: Moderate Sedation

## 2014-04-27 MED ORDER — LIDOCAINE VISCOUS 2 % MT SOLN
OROMUCOSAL | Status: AC
  Filled 2014-04-27: qty 15

## 2014-04-27 MED ORDER — MIDAZOLAM HCL 5 MG/5ML IJ SOLN
INTRAMUSCULAR | Status: AC
  Filled 2014-04-27: qty 10

## 2014-04-27 MED ORDER — LIDOCAINE VISCOUS 2 % MT SOLN
OROMUCOSAL | Status: DC | PRN
  Administered 2014-04-27: 1 via OROMUCOSAL

## 2014-04-27 MED ORDER — MIDAZOLAM HCL 5 MG/5ML IJ SOLN
INTRAMUSCULAR | Status: DC | PRN
  Administered 2014-04-27: 1 mg via INTRAVENOUS
  Administered 2014-04-27: 2 mg via INTRAVENOUS
  Administered 2014-04-27: 1 mg via INTRAVENOUS
  Administered 2014-04-27: 2 mg via INTRAVENOUS

## 2014-04-27 MED ORDER — MEPERIDINE HCL 100 MG/ML IJ SOLN
INTRAMUSCULAR | Status: AC
  Filled 2014-04-27: qty 2

## 2014-04-27 MED ORDER — STERILE WATER FOR IRRIGATION IR SOLN
Status: DC | PRN
  Administered 2014-04-27: 10:00:00

## 2014-04-27 MED ORDER — MINERAL OIL PO OIL
TOPICAL_OIL | ORAL | Status: AC
  Filled 2014-04-27: qty 30

## 2014-04-27 MED ORDER — MEPERIDINE HCL 100 MG/ML IJ SOLN
INTRAMUSCULAR | Status: DC | PRN
  Administered 2014-04-27 (×3): 25 mg via INTRAVENOUS

## 2014-04-27 MED ORDER — SODIUM CHLORIDE 0.9 % IV SOLN
INTRAVENOUS | Status: DC
  Administered 2014-04-27: 09:00:00 via INTRAVENOUS

## 2014-04-27 NOTE — Discharge Instructions (Signed)
You have internal hemorrhoids, and HAD 1 small polyp removed. You have MILD gastritis.I biopsied your stomach AND SMALL BOWEL.   CONTINUE PROTONIX.  YOUR BIOPSY WILL BE BACK IN 14 DAYS.  FOLLOW A HIGH FIBER/LOW FAT DIET. AVOID ITEMS THAT CAUSE BLOATING. SEE INFO BELOW.  FOLLOW UP IN 3 MOS.   Next colonoscopy in 5-10 years.   ENDOSCOPY Care After Read the instructions outlined below and refer to this sheet in the next week. These discharge instructions provide you with general information on caring for yourself after you leave the hospital. While your treatment has been planned according to the most current medical practices available, unavoidable complications occasionally occur. If you have any problems or questions after discharge, call DR. Juelz Claar, [phone].  ACTIVITY  You may resume your regular activity, but move at a slower pace for the next 24 hours.   Take frequent rest periods for the next 24 hours.   Walking will help get rid of the air and reduce the bloated feeling in your belly (abdomen).   No driving for 24 hours (because of the medicine (anesthesia) used during the test).   You may shower.   Do not sign any important legal documents or operate any machinery for 24 hours (because of the anesthesia used during the test).    NUTRITION  Drink plenty of fluids.   You may resume your normal diet as instructed by your doctor.   Begin with a light meal and progress to your normal diet. Heavy or fried foods are harder to digest and may make you feel sick to your stomach (nauseated).   Avoid alcoholic beverages for 24 hours or as instructed.    MEDICATIONS  You may resume your normal medications.   WHAT YOU CAN EXPECT TODAY  Some feelings of bloating in the abdomen.   Passage of more gas than usual.   Spotting of blood in your stool or on the toilet paper  .  IF YOU HAD POLYPS REMOVED DURING THE ENDOSCOPY:  Eat a soft diet IF YOU HAVE NAUSEA,  BLOATING, ABDOMINAL PAIN, OR VOMITING.    FINDING OUT THE RESULTS OF YOUR TEST Not all test results are available during your visit. DR. Oneida Alar WILL CALL YOU WITHIN 7 DAYS OF YOUR PROCEDUE WITH YOUR RESULTS. Do not assume everything is normal if you have not heard from DR. Dannell Raczkowski IN ONE WEEK, CALL HER OFFICE AT [phone].  SEEK IMMEDIATE MEDICAL ATTENTION AND CALL THE OFFICE: [phone] IF:  You have more than a spotting of blood in your stool.   Your belly is swollen (abdominal distention).   You are nauseated or vomiting.   You have a temperature over 101F.   You have abdominal pain or discomfort that is severe or gets worse throughout the day.  Polyps, Colon  A polyp is extra tissue that grows inside your body. Colon polyps grow in the large intestine. The large intestine, also called the colon, is part of your digestive system. It is a long, hollow tube at the end of your digestive tract where your body makes and stores stool. Most polyps are not dangerous. They are benign. This means they are not cancerous. But over time, some types of polyps can turn into cancer. Polyps that are smaller than a pea are usually not harmful. But larger polyps could someday become or may already be cancerous. To be safe, doctors remove all polyps and test them.   WHO GETS POLYPS? Anyone can get polyps, but certain  people are more likely than others. You may have a greater chance of getting polyps if:  You are over 50.   You have had polyps before.   Someone in your family has had polyps.   Someone in your family has had cancer of the large intestine.   Find out if someone in your family has had polyps. You may also be more likely to get polyps if you:   Eat a lot of fatty foods   Smoke   Drink alcohol   Do not exercise  Eat too much   PREVENTION There is not one sure way to prevent polyps. You might be able to lower your risk of getting them if you:  Eat more fruits and vegetables  and less fatty food.   Do not smoke.   Avoid alcohol.   Exercise every day.   Lose weight if you are overweight.   Eating more calcium and folate can also lower your risk of getting polyps. Some foods that are rich in calcium are milk, cheese, and broccoli. Some foods that are rich in folate are chickpeas, kidney beans, and spinach.    Gastritis  Gastritis is an inflammation (the body's way of reacting to injury and/or infection) of the stomach. It is often caused by viral or bacterial (germ) infections. It can also be caused BY ASPIRIN, BC/GOODY POWDER'S, (IBUPROFEN) MOTRIN, OR ALEVE (NAPROXEN), chemicals (including alcohol), SPICY FOODS, and medications. This illness may be associated with generalized malaise (feeling tired, not well), UPPER ABDOMINAL STOMACH cramps, and fever. One common bacterial cause of gastritis is an organism known as H. Pylori. This can be treated with antibiotics.    High-Fiber Diet A high-fiber diet changes your normal diet to include more whole grains, legumes, fruits, and vegetables. Changes in the diet involve replacing refined carbohydrates with unrefined foods. The calorie level of the diet is essentially unchanged. The Dietary Reference Intake (recommended amount) for adult males is 38 grams per day. For adult females, it is 25 grams per day. Pregnant and lactating women should consume 28 grams of fiber per day. Fiber is the intact part of a plant that is not broken down during digestion. Functional fiber is fiber that has been isolated from the plant to provide a beneficial effect in the body. PURPOSE  Increase stool bulk.   Ease and regulate bowel movements.   Lower cholesterol.  INDICATIONS THAT YOU NEED MORE FIBER  Constipation and hemorrhoids.   Uncomplicated diverticulosis (intestine condition) and irritable bowel syndrome.   Weight management.   As a protective measure against hardening of the arteries (atherosclerosis), diabetes, and  cancer.   GUIDELINES FOR INCREASING FIBER IN THE DIET  Start adding fiber to the diet slowly. A gradual increase of about 5 more grams (2 slices of whole-wheat bread, 2 servings of most fruits or vegetables, or 1 bowl of high-fiber cereal) per day is best. Too rapid an increase in fiber may result in constipation, flatulence, and bloating.   Drink enough water and fluids to keep your urine clear or pale yellow. Water, juice, or caffeine-free drinks are recommended. Not drinking enough fluid may cause constipation.   Eat a variety of high-fiber foods rather than one type of fiber.   Try to increase your intake of fiber through using high-fiber foods rather than fiber pills or supplements that contain small amounts of fiber.   The goal is to change the types of food eaten. Do not supplement your present diet with high-fiber foods,  but replace foods in your present diet.  INCLUDE A VARIETY OF FIBER SOURCES  Replace refined and processed grains with whole grains, canned fruits with fresh fruits, and incorporate other fiber sources. White rice, white breads, and most bakery goods contain little or no fiber.   Brown whole-grain rice, buckwheat oats, and many fruits and vegetables are all good sources of fiber. These include: broccoli, Brussels sprouts, cabbage, cauliflower, beets, sweet potatoes, white potatoes (skin on), carrots, tomatoes, eggplant, squash, berries, fresh fruits, and dried fruits.   Cereals appear to be the richest source of fiber. Cereal fiber is found in whole grains and bran. Bran is the fiber-rich outer coat of cereal grain, which is largely removed in refining. In whole-grain cereals, the bran remains. In breakfast cereals, the largest amount of fiber is found in those with "bran" in their names. The fiber content is sometimes indicated on the label.   You may need to include additional fruits and vegetables each day.   In baking, for 1 cup white flour, you may use the  following substitutions:   1 cup whole-wheat flour minus 2 tablespoons.   1/2 cup white flour plus 1/2 cup whole-wheat flour.   Low-Fat Diet BREADS, CEREALS, PASTA, RICE, DRIED PEAS, AND BEANS These products are high in carbohydrates and most are low in fat. Therefore, they can be increased in the diet as substitutes for fatty foods. They too, however, contain calories and should not be eaten in excess. Cereals can be eaten for snacks as well as for breakfast.  Include foods that contain fiber (fruits, vegetables, whole grains, and legumes). Research shows that fiber may lower blood cholesterol levels, especially the water-soluble fiber found in fruits, vegetables, oat products, and legumes. FRUITS AND VEGETABLES It is good to eat fruits and vegetables. Besides being sources of fiber, both are rich in vitamins and some minerals. They help you get the daily allowances of these nutrients. Fruits and vegetables can be used for snacks and desserts. MEATS Limit lean meat, chicken, [place], and fish to no more than 6 ounces per day. Beef, Pork, and Lamb Use lean cuts of beef, pork, and lamb. Lean cuts include:  Extra-lean ground beef.  Arm roast.  Sirloin tip.  Center-cut ham.  Round steak.  Loin chops.  Rump roast.  Tenderloin.  Trim all fat off the outside of meats before cooking. It is not necessary to severely decrease the intake of red meat, but lean choices should be made. Lean meat is rich in protein and contains a highly absorbable form of iron. Premenopausal women, in particular, should avoid reducing lean red meat because this could increase the risk for low red blood cells (iron-deficiency anemia). The organ meats, such as liver, sweetbreads, kidneys, and brain are very rich in cholesterol. They should be limited. Chicken and [place] These are good sources of protein. The fat of poultry can be reduced by removing the skin and underlying fat layers before cooking. Chicken and [place] can  be substituted for lean red meat in the diet. Poultry should not be fried or covered with high-fat sauces. Fish and Shellfish Fish is a good source of protein. Shellfish contain cholesterol, but they usually are low in saturated fatty acids. The preparation of fish is important. Like chicken and [place], they should not be fried or covered with high-fat sauces. EGGS Egg whites contain no fat or cholesterol. They can be eaten often. Try 1 to 2 egg whites instead of whole eggs in recipes or use  egg substitutes that do not contain yolk. MILK AND DAIRY PRODUCTS Use skim or 1% milk instead of 2% or whole milk. Decrease whole milk, natural, and processed cheeses. Use nonfat or low-fat (2%) cottage cheese or low-fat cheeses made from vegetable oils. Choose nonfat or low-fat (1 to 2%) yogurt. Experiment with evaporated skim milk in recipes that call for heavy cream. Substitute low-fat yogurt or low-fat cottage cheese for sour cream in dips and salad dressings. Have at least 2 servings of low-fat dairy products, such as 2 glasses of skim (or 1%) milk each day to help get your daily calcium intake.  FATS AND OILS Reduce the total intake of fats, especially saturated fat. Butterfat, lard, and beef fats are high in saturated fat and cholesterol. These should be avoided as much as possible. Vegetable fats do not contain cholesterol, but certain vegetable fats, such as coconut oil, palm oil, and palm kernel oil are very high in saturated fats. These should be limited. These fats are often used in bakery goods, processed foods, popcorn, oils, and nondairy creamers. Vegetable shortenings and some peanut butters contain hydrogenated oils, which are also saturated fats. Read the labels on these foods and check for saturated vegetable oils. Unsaturated vegetable oils and fats do not raise blood cholesterol. However, they should be limited because they are fats and are high in calories. Total fat should still be limited to 30%  of your daily caloric intake. Desirable liquid vegetable oils are corn oil, cottonseed oil, olive oil, canola oil, safflower oil, soybean oil, and sunflower oil. Peanut oil is not as good, but small amounts are acceptable. Buy a heart-healthy tub margarine that has no partially hydrogenated oils in the ingredients. Mayonnaise and salad dressings often are made from unsaturated fats, but they should also be limited because of their high calorie and fat content. Seeds, nuts, peanut butter, olives, and avocados are high in fat, but the fat is mainly the unsaturated type. These foods should be limited mainly to avoid excess calories and fat. OTHER EATING TIPS Snacks  Most sweets should be limited as snacks. They tend to be rich in calories and fats, and their caloric content outweighs their nutritional value. Some good choices in snacks are graham crackers, melba toast, soda crackers, bagels (no egg), English muffins, fruits, and vegetables. These snacks are preferable to snack crackers, [place] fries, and chips. Popcorn should be air-popped or cooked in small amounts of liquid vegetable oil. Desserts Eat fruit, low-fat yogurt, and fruit ices. AVOID pastries, cake, and cookies. Sherbet, angel food cake, gelatin dessert, frozen low-fat yogurt, or other frozen products that do not contain saturated fat (pure fruit juice bars, frozen ice pops) are also acceptable.  COOKING METHODS Choose those methods that use little or no fat. They include: Poaching.  Braising.  Steaming.  Grilling.  Baking.  Stir-frying.  Broiling.  Microwaving.  Foods can be cooked in a nonstick pan without added fat, or use a nonfat cooking spray in regular cookware. Limit fried foods and avoid frying in saturated fat. Add moisture to lean meats by using water, broth, cooking wines, and other nonfat or low-fat sauces along with the cooking methods mentioned above. Soups and stews should be chilled after cooking. The fat that forms on  top after a few hours in the refrigerator should be skimmed off. When preparing meals, avoid using excess salt. Salt can contribute to raising blood pressure in some people. EATING AWAY FROM HOME Order entres, potatoes, and vegetables without sauces or  butter. When meat exceeds the size of a deck of cards (3 to 4 ounces), the rest can be taken home for another meal. Choose vegetable or fruit salads and ask for low-calorie salad dressings to be served on the side. Use dressings sparingly. Limit high-fat toppings, such as bacon, crumbled eggs, cheese, sunflower seeds, and olives. Ask for heart-healthy tub margarine instead of butter.   Hemorrhoids Hemorrhoids are dilated (enlarged) veins around the rectum. Sometimes clots will form in the veins. This makes them swollen and painful. These are called thrombosed hemorrhoids. Causes of hemorrhoids include:  Constipation.   Straining to have a bowel movement.   HEAVY LIFTING HOME CARE INSTRUCTIONS  Eat a well balanced diet and drink 6 to 8 glasses of water every day to avoid constipation. You may also use a bulk laxative.   Avoid straining to have bowel movements.   Keep anal area dry and clean.   Do not use a donut shaped pillow or sit on the toilet for long periods. This increases blood pooling and pain.   Move your bowels when your body has the urge; this will require less straining and will decrease pain and pressure.

## 2014-04-27 NOTE — H&P (Signed)
Primary Care Physician:  Montey Hora Primary Gastroenterologist:  Dr. Oneida Alar  Pre-Procedure History & Physical: HPI:  Kari Henson is a 48 y.o. female here for SCREENING/dyspepsia.  Past Medical History  Diagnosis Date  . SLE (systemic lupus erythematosus) 2010    she was on MTX (d/c due to cytopenia).  She has been on Prednisone and Plaquenil  . Seasonal allergies   . 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 Lung fibrosis     follows with a Dr. Ouida Sills, pulmonary in [place], [place]  . GERD (gastroesophageal reflux disease)     Past Surgical History  Procedure Laterality Date  . Cesarean section    . Hernia repair    . Multiple tooth extractions      Prior to Admission medications   Medication Sig Start Date End Date Taking? Authorizing Provider  acetaminophen (TYLENOL) 500 MG tablet Take 500-1,000 mg by mouth every 6 (six) hours as needed for moderate pain.    Yes Historical Provider, MD  citalopram (CELEXA) 20 MG tablet Take 20 mg by mouth daily.   Yes Historical Provider, MD  pantoprazole (PROTONIX) 40 MG tablet Take 40 mg by mouth every morning.   Yes Historical Provider, MD  peg 3350 powder (MOVIPREP) 100 G SOLR Take 1 kit (200 g total) by mouth as directed. 04/16/14  Yes Danie Binder, MD  Polyethyl Glycol-Propyl Glycol (SYSTANE OP) Apply 1 drop to eye daily as needed (dry eyes).   Yes Historical Provider, MD  simvastatin (ZOCOR) 20 MG tablet Take 20 mg by mouth at bedtime.   Yes Historical Provider, MD    Allergies as of 04/16/2014  . (No Known Allergies)    Family History  Problem Relation Age of Onset  . Asthma Mother   . Thyroid disease Mother   . Diabetes Mother   . COPD Mother   . Hypertension Mother   . Cirrhosis Father   . Alcohol abuse Father   . Diabetes Maternal Aunt   . Diabetes Maternal Uncle   . Diabetes Paternal Aunt   . Hypertension Maternal Grandmother   . COPD Maternal Grandmother   . Arthritis Maternal  Grandmother     rheumatoid arthritis  . Hypertension Sister   . Colon cancer Neg Hx     History   Social History  . Marital Status: Single    Spouse Name: N/A    Number of Children: 1  . Years of Education: N/A   Occupational History  .      used to work in [employer]; now applying for disability due to [place] History Main Topics  . Smoking status: Former Smoker -- 20 years    Quit date: 08/31/2011  . Smokeless tobacco: Never Used  . Alcohol Use: No  . Drug Use: No  . Sexual Activity: Yes   Other Topics Concern  . Not on file   Social History Narrative  . No narrative on file    Review of Systems: See HPI, otherwise negative ROS   Physical Exam: BP 138/96  Pulse 94  Temp(Src) 97.7 F (36.5 C) (Oral)  Resp 18  Ht _0  (1.575 m)  Wt 160 lb (72.576 kg)  BMI 29.26 kg/m2  SpO2 96%  LMP 07/01/2013 General:   Alert,  pleasant and cooperative in NAD Head:  Normocephalic and atraumatic. Neck:  Supple; Lungs:  Clear throughout to auscultation.    Heart:  Regular rate and rhythm. Abdomen:  Soft, nontender  and nondistended. Normal bowel sounds, without guarding, and without rebound.   Neurologic:  Alert and  oriented x4;  grossly normal neurologically.  Impression/Plan:    SCREENING/dyspepsia  Plan:  1. TCS/EGD TODAY

## 2014-04-27 NOTE — Progress Notes (Signed)
REVIEWED-NO ADDITIONAL RECOMMENDATIONS.Marland Kitchen

## 2014-04-28 NOTE — Op Note (Signed)
[facility] [address] [place], [address]   ENDOSCOPY PROCEDURE REPORT  PATIENT: Kari Henson, Kari Henson  MR#: [id number] BIRTHDATE: [birth year] , 48  yrs. old GENDER: female  ENDOSCOPIST: Barney Drain, MD REFERRED ID:[id number] McElroy, PA-C Tobie Lords, M.D.  PROCEDURE DATE: 04/27/2014 PROCEDURE:   EGD w/ biopsy INDICATIONS:dyspepsia. PMHx: PULMONARY FIBROSIS AND LUPUS OFF PLAQUENIL JUN 2015. SX BEGAN JUL 2015 & BROUGHT ON BY FATTY FOODS MOSTLY. PARTIALLY IMPROVED WITH PROTONIX. MEDICATIONS: TCS+ 1 mg IV and Demerol 25 mg IV TOPICAL ANESTHETIC:   Viscous Xylocaine ASA CLASS:  DESCRIPTION OF PROCEDURE:     Physical exam was performed.  Informed consent was obtained from the patient after explaining the benefits, risks, and alternatives to the procedure.  The patient was connected to the monitor and placed in the left lateral position.  Continuous oxygen was provided by nasal cannula and IV medicine administered through an indwelling cannula.  After administration of sedation, the patients esophagus was intubated and the EC-[id number] ([id number])  endoscope was advanced under direct visualization to the second portion of the duodenum.  The scope was removed slowly by carefully examining the color, texture, anatomy, and integrity of the mucosa on the way out.  The patient was recovered in endoscopy and discharged home in satisfactory condition.    ESOPHAGUS: The mucosa of the esophagus appeared normal.   STOMACH:: Non-erosive gastritis (inflammation) was found in the gastric antrum.  Multiple biopsies were performed using cold forceps. DUODENUM: The duodenal mucosa showed no abnormalities in the bulb and 2nd part of the duodenum.  Cold forceps biopsies were taken in the bulb and second portion. COMPLICATIONS: There were no immediate complications.  ENDOSCOPIC IMPRESSION: 1.   NO OBVIOUS SOURCE FOR ABDOMINAL PAIN IDENTIFIED. 2.   Non-erosive gastritis (inflammation)  was found in the gastric antrum; multiple biopsies were performed  RECOMMENDATIONS: CONTINUE PROTONIX. BIOPSY WILL BE BACK IN 14 DAYS. CONSIDER CTANGIO TO EVALUATE FOR VASCULITIS. FOLLOW A HIGH FIBER/LOW FAT DIET.  AVOID ITEMS THAT CAUSE BLOATING. FOLLOW UP IN 3 MOS. Next colonoscopy in 5-10 years.  REPEAT EXAM:  eSigned:  Barney Drain, MD 05-19-2014 9:04 PM     CPT CODES: ICD CODES:  The ICD and CPT codes recommended by this software are interpretations from the data that the clinical staff has captured with the software.  The verification of the translation of this report to the ICD and CPT codes and modifiers is the sole responsibility of the health care institution and practicing physician where this report was generated.  [employer]. will not be held responsible for the validity of the ICD and CPT codes included on this report.  AMA assumes no liability for data contained or not contained herein. CPT is a Designer, television/film set of the [employer].

## 2014-04-28 NOTE — Op Note (Signed)
[facility] [address] [place], [address]   COLONOSCOPY PROCEDURE REPORT  PATIENT: Kari Henson, Kari Henson  MR#: [id number] BIRTHDATE: 02-15-1966 , 48  yrs. old GENDER: female ENDOSCOPIST: Barney Drain, MD REFERRED ER:DEYCXKG McElroy, PA-C Tobie Lords, M.D. PROCEDURE DATE:  04/27/2014 PROCEDURE:   Colonoscopy with cold biopsy polypectomy INDICATIONS:average risk for colon cancer. MEDICATIONS: Demerol 50 mg IV and Versed 5 mg IV  DESCRIPTION OF PROCEDURE:    Physical exam was performed.  Informed consent was obtained from the patient after explaining the benefits, risks, and alternatives to procedure.  The patient was connected to monitor and placed in left lateral position. Continuous oxygen was provided by nasal cannula and IV medicine administered through an indwelling cannula.  After administration of sedation and rectal examcmcm, the patients rectum was intubated and the EC-[id number] ([id number])  colonoscope was advanced under direct visualization to the ileum.  The scope was removed slowly by carefully examining the color, texture, anatomy, and integrity mucosa on the way out.  The patient was recovered in endoscopy and discharged home in satisfactory condition.    COLON FINDINGS: A sessile polyp measuring 6 mm in size was found in the rectum.  A polypectomy was performed with cold forceps.  , The colon was redundant.  Manual abdominal counter-pressure was used to reach the cecum, The examined terminal ileum appeared to be normal. , and Small internal hemorrhoids were found.  PREP QUALITY: good. CECAL W/D TIME: 12 mins  COMPLICATIONS: None  ENDOSCOPIC IMPRESSION: 1.   ONE COLON POLYP REMOVED 2.   The LEFT COLON IS redundant 3.   The examined terminal ileum appeared to be normal 4.   Small internal hemorrhoids  RECOMMENDATIONS: CONTINUE PROTONIX. BIOPSY WILL BE BACK IN 14 DAYS.  CONSIDR CT ANGIO TO EVALUATE FOR VASCULITIS. FOLLOW A HIGH FIBER/LOW FAT  DIET.  AVOID ITEMS THAT CAUSE BLOATING.  FOLLOW UP IN 3 MOS. Next colonoscopy in 5-10 years WITH AN OVERTUBE.   _______________________________ eSignedBarney Drain, MD 2014/05/08 9:21 PM   CPT CODES: ICD CODES:  The ICD and CPT codes recommended by this software are interpretations from the data that the clinical staff has captured with the software.  The verification of the translation of this report to the ICD and CPT codes and modifiers is the sole responsibility of the health care institution and practicing physician where this report was generated.  [employer] will not be held responsible for the validity of the ICD and CPT codes included on this report.  AMA assumes no liability for data contained or not contained herein. CPT is a Designer, television/film set of the [employer].

## 2014-05-10 ENCOUNTER — Telehealth: Payer: Self-pay

## 2014-05-10 DIAGNOSIS — R1033 Periumbilical pain: Secondary | ICD-10-CM

## 2014-05-10 LAB — COMPREHENSIVE METABOLIC PANEL
ALT: 23
AST: 72 U/L
Alkaline Phosphatase: 155 U/L
Total Bilirubin: 0.5 mg/dL

## 2014-05-10 NOTE — Telephone Encounter (Signed)
PT called for results of path from procedure on 04/27/2014. I left her a VM on [phone] that we allow 7-10 business days for the results. Since Dr. Oneida Alar is on vacation I will send her request to Laban Emperor, NP, who saw the pt in the office. I will call her when I get the results from [place].

## 2014-05-16 ENCOUNTER — Other Ambulatory Visit: Payer: Self-pay

## 2014-05-16 DIAGNOSIS — K6282 Dysplasia of anus: Secondary | ICD-10-CM

## 2014-05-16 DIAGNOSIS — A630 Anogenital (venereal) warts: Secondary | ICD-10-CM

## 2014-05-16 DIAGNOSIS — R109 Unspecified abdominal pain: Secondary | ICD-10-CM

## 2014-05-16 NOTE — Telephone Encounter (Signed)
ON RECALL FOR APPT AND TCS

## 2014-05-16 NOTE — Telephone Encounter (Signed)
Reminder in epic °

## 2014-05-16 NOTE — Telephone Encounter (Signed)
I was waiting for the addended report to return. It was not complete.  It looks like it is now addended per pathology. Routing to Dr. Oneida Alar for further recommendations.

## 2014-05-16 NOTE — Telephone Encounter (Signed)
Called patient TO DISCUSS CONCERNS. LVM-CALL [phone] TO DISCUSS.  Please call pt. She had a polypoid lesion, removed and it was DUE TO ANAL WARTS. TEH BIOPSY SHOWS ANAL WARTS WITH SEVERE DYSPLASIA. SHE NEEDS A REFERRAL TO [facility] FOR ANAL CONDYLOMA WITH SEVERE DYSPLASIA. IF SHE IS STILL HAVING ABDOMINAL PAIN, SHE SHOULD HAVE A CT ANGIO ABD/PELVIS TO LOOK FOR LUPUS AFFECTING THE BLOOD VESSELS IN HER ABDOMEN -DISCUSSED WITH DR. PATEL.   CONTINUE PROTONIX. FOLLOW A HIGH FIBER/LOW FAT DIET. AVOID ITEMS THAT CAUSE BLOATING.  FOLLOW UP IN 3 MOS E30 ANAL CONDYLOMA, ABDOMINAL PAIN.   Next colonoscopy in 10 years.  CC: DR. Tobie Lords

## 2014-05-16 NOTE — Telephone Encounter (Signed)
Pt returned call and was informed. She is still having some abdominal pain and would like to have the CT.

## 2014-05-16 NOTE — Telephone Encounter (Signed)
Spoke with patient. She is scheduled for CT scan on 05/21/2014 at 10:45am.

## 2014-05-17 NOTE — Telephone Encounter (Signed)
Referral has been made to [facility] in [place] due to pt having [facility]. [facility] will not accept.

## 2014-05-21 ENCOUNTER — Ambulatory Visit (HOSPITAL_COMMUNITY)
Admission: RE | Admit: 2014-05-21 | Discharge: 2014-05-21 | Disposition: A | Discharge status: Home / Self Care | Payer: Medicare Other | Source: Ambulatory Visit | Attending: Gastroenterology | Admitting: Gastroenterology

## 2014-05-21 DIAGNOSIS — R1033 Periumbilical pain: Secondary | ICD-10-CM | POA: Diagnosis present

## 2014-05-21 DIAGNOSIS — M329 Systemic lupus erythematosus, unspecified: Secondary | ICD-10-CM | POA: Diagnosis not present

## 2014-05-21 MED ORDER — IOHEXOL 350 MG/ML SOLN
100.0000 mL | Freq: Once | INTRAVENOUS | Status: AC | PRN
  Administered 2014-05-21: 100 mL via INTRAVENOUS

## 2014-06-06 NOTE — Progress Notes (Signed)
Received outside labs dated Sept 2015. Tbili 0.4, Alk Phos 114, AST 29, ALT 15. Normalized.

## 2014-06-14 ENCOUNTER — Encounter: Payer: Self-pay | Admitting: Gastroenterology

## 2014-06-14 NOTE — Telephone Encounter (Signed)
ERROR

## 2014-06-26 ENCOUNTER — Ambulatory Visit: Payer: Self-pay | Admitting: Surgery

## 2014-06-27 NOTE — Telephone Encounter (Addendum)
PLEASE CALL PT. HER CT ANGIO SHOWED NO PROBLEMS WITH THE VESSELS IN HER ABDOMEN. HER L OVARY HAS BENIGN APPEARING CYSTS.  SEE SURGERY FOR HER ANAL WARTS.  CONTINUE PROTONIX. FOLLOW A HIGH FIBER/LOW FAT DIET. AVOID ITEMS THAT CAUSE BLOATING.

## 2014-06-27 NOTE — Telephone Encounter (Signed)
Spoke with pt. And she is aware of results. She just had her surgery on 06/26/2014 regarding warts

## 2014-06-28 NOTE — Telephone Encounter (Signed)
Reminder in epic °

## 2014-07-30 ENCOUNTER — Encounter: Payer: Self-pay | Admitting: Gastroenterology

## 2014-08-23 ENCOUNTER — Ambulatory Visit (INDEPENDENT_AMBULATORY_CARE_PROVIDER_SITE_OTHER): Payer: Self-pay | Admitting: Nurse Practitioner

## 2014-08-23 ENCOUNTER — Encounter: Payer: Self-pay | Admitting: Nurse Practitioner

## 2014-08-23 VITALS — BP 136/83 | HR 105 | Temp 98.4°F | Ht 62.0 in | Wt 157.6 lb

## 2014-08-23 DIAGNOSIS — R1013 Epigastric pain: Secondary | ICD-10-CM

## 2014-08-23 NOTE — Progress Notes (Signed)
Referring Provider: Montey Hora Primary Care Physician:  Montey Hora Primary GI: Dr. Oneida Alar  Chief Complaint  Patient presents with  . Follow-up    Doing much better since gallbladder surgery in 05/2014    HPI:   49 year old female presents for follow-up on abdominal pain. EGD and colonoscopy done 04/27/14 with no explanation for abdominal pain. Follow-up CT A&P with no acute finding.   Today states she had outpatient lap chole in [place] in December 2015. Since then her symptoms have resolved. Only symptoms she's still having is some mild tenderness around her naval incision when buttoning jeans tightly right over that spot. Denies N/V, worsening abdominal pain (specifically no RUQ pain.) Has a bowel movement typically every other day which is occasionally loose after her cholecystectomy, but not really bothersome. Denies hematochezia and melena. Denies any other upper or lower GI symptoms. Denies fever, chills, chest pain, shortness of breath, GERD symptoms. Has had some decrease in appetite since surgery but this is improving. Also with some subsequent mild weight loss, though this is returning back to baseline as well.  Past Medical History  Diagnosis Date  . SLE (systemic lupus erythematosus) 2010    she was on MTX (d/c due to cytopenia).  She has been on Prednisone and Plaquenil  . Seasonal allergies   . 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 Lung fibrosis     follows with a Dr. Ouida Sills, pulmonary in [place], [place]  . GERD (gastroesophageal reflux disease)     Past Surgical History  Procedure Laterality Date  . Cesarean section    . Hernia repair    . Multiple tooth extractions    . Esophagogastroduodenoscopy N/A 04/27/2014    SLF: 1. No obvious source for abdominal pain identified. 2. Non-erosive gastritis (inflammation) was found in the gastric antrum; multiple biopsies were performed.  . Colonoscopy N/A 04/27/2014   SLF:  1. one colon polyp removed 2 The left colonis redundant 3. the examined terminal ileum apperared to be  normal 3. small internal hemorrohoids.    Current Outpatient Prescriptions  Medication Sig Dispense Refill  . acetaminophen (TYLENOL) 500 MG tablet Take 500-1,000 mg by mouth every 6 (six) hours as needed for moderate pain.     . citalopram (CELEXA) 20 MG tablet Take 20 mg by mouth daily.    . pantoprazole (PROTONIX) 40 MG tablet Take 40 mg by mouth every morning.    . peg 3350 powder (MOVIPREP) 100 G SOLR Take 1 kit (200 g total) by mouth as directed. (Patient not taking: Reported on 08/23/2014) 1 kit 0  . Polyethyl Glycol-Propyl Glycol (SYSTANE OP) Apply 1 drop to eye daily as needed (dry eyes).    . simvastatin (ZOCOR) 20 MG tablet Take 20 mg by mouth at bedtime.     No current facility-administered medications for this visit.    Allergies as of 08/23/2014  . (No Known Allergies)    Family History  Problem Relation Age of Onset  . Asthma Mother   . Thyroid disease Mother   . Diabetes Mother   . COPD Mother   . Hypertension Mother   . Cirrhosis Father   . Alcohol abuse Father   . Diabetes Maternal Aunt   . Diabetes Maternal Uncle   . Diabetes Paternal Aunt   . Hypertension Maternal Grandmother   . COPD Maternal Grandmother   . Arthritis Maternal Grandmother     rheumatoid arthritis  . Hypertension  Sister   . Colon cancer Neg Hx     History   Social History  . Marital Status: Single    Spouse Name: N/A  . Number of Children: 1  . Years of Education: N/A   Occupational History  .      used to work in [employer]; now applying for disability due to [place] History Main Topics  . Smoking status: Former Smoker -- 20 years    Quit date: 08/31/2011  . Smokeless tobacco: Never Used     Comment: Quit x 2 years  . Alcohol Use: No  . Drug Use: No  . Sexual Activity: Yes   Other Topics Concern  . None   Social History Narrative    Review of Systems: Gen:  Denies fever, chills, anorexia. Denies fatigue, weakness, weight loss.  CV: Denies chest pain, palpitations, syncope, peripheral edema, and claudication. Resp: Denies dyspnea at rest, cough, wheezing, coughing up blood, and pleurisy. GI: Denies vomiting blood, jaundice, and fecal incontinence.   Denies dysphagia or odynophagia. Derm: Denies rash, itching, dry skin Psych: Denies depression, anxiety, memory loss, confusion. No homicidal or suicidal ideation.  Heme: Denies bruising, bleeding, and enlarged lymph nodes.  Physical Exam: BP 136/83 mmHg  Pulse 105  Temp(Src) 98.4 F (36.9 C) (Oral)  Ht _0  (1.575 m)  Wt 157 lb 9.6 oz (71.487 kg)  BMI 28.82 kg/m2  LMP 07/01/2013 General:   Alert and oriented. No distress noted. Pleasant and cooperative.  Head:  Normocephalic and atraumatic. Eyes:  Conjuctiva clear without scleral icterus. Mouth:  Oral mucosa pink and moist. Good dentition. No lesions. Neck:  Supple, without mass or thyromegaly. Lungs:  Clear to auscultation bilaterally. No wheezes, rales, or rhonchi. No distress.  Heart:  S1, S2 present without murmurs, rubs, or gallops. Regular rate and rhythm. Abdomen:  +BS, soft, non-tender and non-distended. No rebound or guarding. No HSM or masses noted. Msk:  Symmetrical without gross deformities. Normal posture. Pulses:  2+ DP noted bilaterally Extremities:  Without edema. Neurologic:  Alert and  oriented x4;  grossly normal neurologically. Skin:  Intact without significant lesions or rashes. Cervical Nodes:  No significant cervical adenopathy. Psych:  Alert and cooperative. Normal mood and affect.    08/23/2014 1:41 PM

## 2014-08-23 NOTE — Assessment & Plan Note (Signed)
S/P lap chole about 8 weeks ago, symptoms have resolved. Minor soreness residual from surgery. Otherwise oing well. No further pain. On recall list for repeat colonoscopy in 5-10 years. Otherwise can return prn any symptoms.

## 2014-08-23 NOTE — Patient Instructions (Signed)
1. We're glad you're feeling better! 2. Follow-up with [place] if you need to. 3. You are on the recall list to repeat your colonoscopy in 5-10 years based on the results of your previous colonoscopy 4. Call us if you have any questions or concerns.

## 2014-08-28 NOTE — Progress Notes (Signed)
CC'ED TO PCP

## 2014-10-22 LAB — SURGICAL PATHOLOGY

## 2014-10-24 NOTE — Op Note (Signed)
PATIENT NAME:  Kari Henson, Kari Henson MR#:  [id number] DATE OF BIRTH:  09/29/1965  DATE OF PROCEDURE:  06/26/2014  ATTENDING PHYSICIAN:  Harrell Gave A. Lari Linson, MD   PREOPERATIVE DIAGNOSIS:  Symptomatic gallbladder sludge.   POSTOPERATIVE DIAGNOSES: Symptomatic gallbladder sludge, plus chronic cholecystitis.   ANESTHESIA: General.   ESTIMATED BLOOD LOSS: Minimal.   COMPLICATIONS: None.   SPECIMENS: Gallbladder.   INDICATION FOR SURGERY: [name] is a pleasant 49 year old female with history of a persistent right upper quadrant pain, which did not respond to PPI. She was brought to the operating room suite, and she was noted to have an ultrasound was gallbladder sludge, which I thought could be causing her symptoms, as they were biliary in nature. She was brought to the Operating Room suite for laparoscopic cholecystectomy.   DETAILS OF PROCEDURE: Informed consent was obtained. [name] was brought to the operating room suite. She was laid supine on the Operating Room table. She was induced. Endotracheal tube was placed. General anesthesia was administered. Her abdomen was prepped and draped in standard surgical fashion. A timeout was then performed, correctly identifying the patient name, operative site, and procedure to be performed. An infraumbilical incision was made and deepened to the fascia. The fascia was incised. The peritoneum was entered. Two stay sutures were placed through the fasciotomy. An 11 mm epigastric and 2 right subcostal 5 mm trocars were placed in the mid clavicular and anterior axillary line. The gallbladder was then lifted over the dome of the liver. The cystic duct and cystic artery were dissected out. Critical view was obtained. Both structures were clipped and ligated. The gallbladder was then taken off the gallbladder fossa with hook cautery and brought out through an Endo Catch bag. It did have a significant amount of adhesions to it and was quite distended and  difficult to grasp; therefore, thinking there was some component of chronic cholecystitis, the gallbladder was taken out of an Endo Catch bag. The gallbladder fossa was made hemostatic. The abdomen was irrigated. After hemostasis was obtained, all trocars were removed under direct visualization. The fascia was closed with figure-of-eight 0 Vicryl. All skin sutures were closed with 4-0 Monocryl deep dermal sutures. Steri-Strips, Telfa gauze, and Tegaderm were used to complete the dressing. The patient was then awoken, extubated, and brought to the postanesthesia care unit. There were no immediate complications. Needle, sponge, and instrument count was correct at the end of the procedure.    ____________________________ Glena Norfolk. Tirzah Fross, MD cal:mw D: 06/26/2014 16:17:00 ET T: 06/26/2014 16:30:23 ET JOB#: [id number] Floyde Parkins MD ELECTRONICALLY SIGNED 07/10/2014 19:48

## 2014-11-21 LAB — COMPREHENSIVE METABOLIC PANEL
ALT: 15 U/L (ref 7–35)
AST: 29 U/L
Alkaline Phosphatase: 114 U/L
Total Bilirubin: 0.4 mg/dL

## 2015-01-31 ENCOUNTER — Other Ambulatory Visit (HOSPITAL_COMMUNITY): Payer: Self-pay | Admitting: Nurse Practitioner

## 2015-01-31 DIAGNOSIS — Z1231 Encounter for screening mammogram for malignant neoplasm of breast: Secondary | ICD-10-CM

## 2015-07-02 DIAGNOSIS — Z7189 Other specified counseling: Secondary | ICD-10-CM | POA: Diagnosis not present

## 2015-07-02 DIAGNOSIS — Z418 Encounter for other procedures for purposes other than remedying health state: Secondary | ICD-10-CM | POA: Diagnosis not present

## 2015-07-02 DIAGNOSIS — Z01419 Encounter for gynecological examination (general) (routine) without abnormal findings: Secondary | ICD-10-CM | POA: Diagnosis not present

## 2015-07-02 DIAGNOSIS — E559 Vitamin D deficiency, unspecified: Secondary | ICD-10-CM | POA: Diagnosis not present

## 2015-07-02 DIAGNOSIS — Z6828 Body mass index (BMI) 28.0-28.9, adult: Secondary | ICD-10-CM | POA: Diagnosis not present

## 2015-07-02 DIAGNOSIS — Z0000 Encounter for general adult medical examination without abnormal findings: Secondary | ICD-10-CM | POA: Diagnosis not present

## 2015-07-02 DIAGNOSIS — E785 Hyperlipidemia, unspecified: Secondary | ICD-10-CM | POA: Diagnosis not present

## 2015-07-02 DIAGNOSIS — R5383 Other fatigue: Secondary | ICD-10-CM | POA: Diagnosis not present

## 2015-07-02 DIAGNOSIS — Z1211 Encounter for screening for malignant neoplasm of colon: Secondary | ICD-10-CM | POA: Diagnosis not present

## 2015-07-02 DIAGNOSIS — Z79899 Other long term (current) drug therapy: Secondary | ICD-10-CM | POA: Diagnosis not present

## 2015-07-04 DIAGNOSIS — M329 Systemic lupus erythematosus, unspecified: Secondary | ICD-10-CM | POA: Diagnosis not present

## 2015-07-04 DIAGNOSIS — J069 Acute upper respiratory infection, unspecified: Secondary | ICD-10-CM | POA: Diagnosis not present

## 2015-07-04 DIAGNOSIS — Z79899 Other long term (current) drug therapy: Secondary | ICD-10-CM | POA: Diagnosis not present

## 2015-07-04 DIAGNOSIS — J8403 Idiopathic pulmonary hemosiderosis: Secondary | ICD-10-CM | POA: Diagnosis not present

## 2015-08-08 DIAGNOSIS — Z79899 Other long term (current) drug therapy: Secondary | ICD-10-CM | POA: Diagnosis not present

## 2015-08-08 DIAGNOSIS — J8403 Idiopathic pulmonary hemosiderosis: Secondary | ICD-10-CM | POA: Diagnosis not present

## 2015-08-08 DIAGNOSIS — R05 Cough: Secondary | ICD-10-CM | POA: Diagnosis not present

## 2015-08-08 DIAGNOSIS — M329 Systemic lupus erythematosus, unspecified: Secondary | ICD-10-CM | POA: Diagnosis not present

## 2015-12-16 DIAGNOSIS — M329 Systemic lupus erythematosus, unspecified: Secondary | ICD-10-CM | POA: Diagnosis not present

## 2015-12-16 DIAGNOSIS — J8403 Idiopathic pulmonary hemosiderosis: Secondary | ICD-10-CM | POA: Diagnosis not present

## 2015-12-16 DIAGNOSIS — R05 Cough: Secondary | ICD-10-CM | POA: Diagnosis not present

## 2015-12-16 DIAGNOSIS — Z79899 Other long term (current) drug therapy: Secondary | ICD-10-CM | POA: Diagnosis not present

## 2015-12-17 DIAGNOSIS — Z79899 Other long term (current) drug therapy: Secondary | ICD-10-CM | POA: Diagnosis not present

## 2015-12-17 DIAGNOSIS — R05 Cough: Secondary | ICD-10-CM | POA: Diagnosis not present

## 2015-12-17 DIAGNOSIS — M329 Systemic lupus erythematosus, unspecified: Secondary | ICD-10-CM | POA: Diagnosis not present

## 2015-12-30 DIAGNOSIS — K219 Gastro-esophageal reflux disease without esophagitis: Secondary | ICD-10-CM | POA: Diagnosis not present

## 2015-12-30 DIAGNOSIS — E785 Hyperlipidemia, unspecified: Secondary | ICD-10-CM | POA: Diagnosis not present

## 2015-12-30 DIAGNOSIS — Z299 Encounter for prophylactic measures, unspecified: Secondary | ICD-10-CM | POA: Diagnosis not present

## 2015-12-30 DIAGNOSIS — E559 Vitamin D deficiency, unspecified: Secondary | ICD-10-CM | POA: Diagnosis not present

## 2015-12-30 DIAGNOSIS — L930 Discoid lupus erythematosus: Secondary | ICD-10-CM | POA: Diagnosis not present

## 2015-12-30 DIAGNOSIS — Z87891 Personal history of nicotine dependence: Secondary | ICD-10-CM | POA: Diagnosis not present

## 2016-01-31 DIAGNOSIS — L259 Unspecified contact dermatitis, unspecified cause: Secondary | ICD-10-CM | POA: Diagnosis not present

## 2016-01-31 DIAGNOSIS — Z1389 Encounter for screening for other disorder: Secondary | ICD-10-CM | POA: Diagnosis not present

## 2016-03-17 DIAGNOSIS — R928 Other abnormal and inconclusive findings on diagnostic imaging of breast: Secondary | ICD-10-CM | POA: Diagnosis not present

## 2016-03-17 DIAGNOSIS — Z1231 Encounter for screening mammogram for malignant neoplasm of breast: Secondary | ICD-10-CM | POA: Diagnosis not present

## 2016-03-25 DIAGNOSIS — R922 Inconclusive mammogram: Secondary | ICD-10-CM | POA: Diagnosis not present

## 2016-03-25 DIAGNOSIS — R928 Other abnormal and inconclusive findings on diagnostic imaging of breast: Secondary | ICD-10-CM | POA: Diagnosis not present

## 2016-03-31 DIAGNOSIS — Z23 Encounter for immunization: Secondary | ICD-10-CM | POA: Diagnosis not present

## 2016-06-15 ENCOUNTER — Encounter: Payer: Self-pay | Admitting: Pulmonary Disease

## 2016-06-15 DIAGNOSIS — J8403 Idiopathic pulmonary hemosiderosis: Secondary | ICD-10-CM | POA: Diagnosis not present

## 2016-06-15 DIAGNOSIS — M329 Systemic lupus erythematosus, unspecified: Secondary | ICD-10-CM | POA: Diagnosis not present

## 2016-06-15 DIAGNOSIS — R05 Cough: Secondary | ICD-10-CM | POA: Diagnosis not present

## 2016-06-15 DIAGNOSIS — Z79899 Other long term (current) drug therapy: Secondary | ICD-10-CM | POA: Diagnosis not present

## 2016-07-03 DIAGNOSIS — E559 Vitamin D deficiency, unspecified: Secondary | ICD-10-CM | POA: Diagnosis not present

## 2016-07-03 DIAGNOSIS — Z79899 Other long term (current) drug therapy: Secondary | ICD-10-CM | POA: Diagnosis not present

## 2016-07-03 DIAGNOSIS — E785 Hyperlipidemia, unspecified: Secondary | ICD-10-CM | POA: Diagnosis not present

## 2016-07-06 DIAGNOSIS — Z1389 Encounter for screening for other disorder: Secondary | ICD-10-CM | POA: Diagnosis not present

## 2016-07-06 DIAGNOSIS — Z7189 Other specified counseling: Secondary | ICD-10-CM | POA: Diagnosis not present

## 2016-07-06 DIAGNOSIS — Z6825 Body mass index (BMI) 25.0-25.9, adult: Secondary | ICD-10-CM | POA: Diagnosis not present

## 2016-07-06 DIAGNOSIS — Z299 Encounter for prophylactic measures, unspecified: Secondary | ICD-10-CM | POA: Diagnosis not present

## 2016-07-06 DIAGNOSIS — Z1211 Encounter for screening for malignant neoplasm of colon: Secondary | ICD-10-CM | POA: Diagnosis not present

## 2016-07-06 DIAGNOSIS — Z0000 Encounter for general adult medical examination without abnormal findings: Secondary | ICD-10-CM | POA: Diagnosis not present

## 2016-07-24 ENCOUNTER — Ambulatory Visit (INDEPENDENT_AMBULATORY_CARE_PROVIDER_SITE_OTHER): Payer: Medicare Other | Admitting: Pulmonary Disease

## 2016-07-24 ENCOUNTER — Encounter: Payer: Self-pay | Admitting: Pulmonary Disease

## 2016-07-24 VITALS — BP 128/74 | HR 99 | Ht 62.0 in | Wt 146.0 lb

## 2016-07-24 DIAGNOSIS — R05 Cough: Secondary | ICD-10-CM | POA: Diagnosis not present

## 2016-07-24 DIAGNOSIS — J849 Interstitial pulmonary disease, unspecified: Secondary | ICD-10-CM | POA: Insufficient documentation

## 2016-07-24 DIAGNOSIS — R053 Chronic cough: Secondary | ICD-10-CM

## 2016-07-24 NOTE — Assessment & Plan Note (Signed)
I believe that this is related to postnasal drip in the setting of allergic rhinitis which has been previously untreated. Occult GERD is also possible. However, as discussed below she may have a contribution from the interstitial lung disease.  Plan: Start treatment for allergic rhinitis with Zyrtec and a nasal steroid If no improvement in 3-4 weeks then treat empirically with gastroesophageal reflux therapy Follow-up interstitial lung disease treatment

## 2016-07-24 NOTE — Progress Notes (Signed)
Subjective:    Patient ID: Kari Henson, female    DOB: 28-Oct-1965, 51 y.o.   MRN: [id number]  HPI Chief Complaint  Patient presents with  . Advice Only    Referred for cough by Dr. Pearlie Henson.  Cough is prod with yellow mucus X6 mos.     Kari Henson has been sent her for evaluation of a cough for the last 6 months: > constant cough at night > produces yellow mucus > has chest tightness from time to time as well > worse with lying flat > worse when it is hot outside > cooler air will help > smoke, dust fumes will make it worse > she feels a tickle in her throat > talking a lot makes it worse > no correlation with eating  She denies heartburn. No indigestion will make it worse.  She notes a runny nose frequently, worse when lying flat. She takes chlortrimeton for the sinus congestion.    She was told that she had scarring of her lungs 2010.  She saw Dr. Koleen Henson with pulmonary medicine in [place] in 2011 who told her she had scarring on her lungs.  She didn't receive any treatment for it.  He said this was due to the would.  She says that she has shortness of breath if she walks a mile.  She can do activities like cleaning the house, carrying in groceries is not a problem.  She says that this has not worsened at all.   She says the lupus has been well controlled.  She says that her sympotms are skin sores, fatigue, weight loss.  All these have been stable late.  She has been on plaquenil lately but notes show that she was treated with Methotrexate from 2010 through 2013.    Past Medical History:  Diagnosis Date  . GERD (gastroesophageal reflux disease)   . Lung fibrosis ([place])    follows with a Dr. Ouida Henson, pulmonary in [place], [place]  . 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 Seasonal allergies   . SLE (systemic lupus erythematosus) ([place]) 2010   she was on MTX (d/c due to cytopenia).  She has been on Prednisone and Plaquenil     Family History  Problem  Relation Age of Onset  . Asthma Mother   . Thyroid disease Mother   . Diabetes Mother   . COPD Mother   . Hypertension Mother   . Cirrhosis Father   . Alcohol abuse Father   . Diabetes Maternal Aunt   . Hypertension Sister   . Diabetes Maternal Uncle   . Diabetes Paternal Aunt   . Hypertension Maternal Grandmother   . COPD Maternal Grandmother   . Arthritis Maternal Grandmother     rheumatoid arthritis  . Colon cancer Neg Hx      Social History   Social History  . Marital status: Single    Spouse name: N/A  . Number of children: 1  . Years of education: N/A   Occupational History  .      used to work in [employer]; now applying for disability due to [place] History Main Topics  . Smoking status: Former Smoker    Packs/day: 2.00    Years: 28.00    Types: Cigarettes    Quit date: 08/31/2011  . Smokeless tobacco: Never Used  . Alcohol use No  . Drug use: No  . Sexual activity: Yes   Other Topics Concern  . Not on file  Social History Narrative  . No narrative on file     No Known Allergies   Outpatient Medications Prior to Visit  Medication Sig Dispense Refill  . acetaminophen (TYLENOL) 500 MG tablet Take 500-1,000 mg by mouth every 6 (six) hours as needed for moderate pain.     . pantoprazole (PROTONIX) 40 MG tablet Take 40 mg by mouth every morning.    . citalopram (CELEXA) 20 MG tablet Take 20 mg by mouth daily.    . peg 3350 powder (MOVIPREP) 100 G SOLR Take 1 kit (200 g total) by mouth as directed. (Patient not taking: Reported on 08/23/2014) 1 kit 0  . Polyethyl Glycol-Propyl Glycol (SYSTANE OP) Apply 1 drop to eye daily as needed (dry eyes).    . simvastatin (ZOCOR) 20 MG tablet Take 20 mg by mouth at bedtime.     No facility-administered medications prior to visit.       Review of Systems  Constitutional: Negative for fever and unexpected weight change.  HENT: Positive for rhinorrhea. Negative for congestion, dental problem, ear pain, nosebleeds,  postnasal drip, sinus pressure, sneezing, sore throat and trouble swallowing.   Eyes: Negative for redness and itching.  Respiratory: Positive for cough, chest tightness and wheezing. Negative for shortness of breath.   Cardiovascular: Negative for palpitations and leg swelling.  Gastrointestinal: Negative for nausea and vomiting.  Genitourinary: Negative for dysuria.  Musculoskeletal: Negative for joint swelling.  Skin: Negative for rash.  Neurological: Negative for headaches.  Hematological: Does not bruise/bleed easily.  Psychiatric/Behavioral: Negative for dysphoric mood. The patient is not nervous/anxious.        Objective:   Physical Exam Vitals:   07/24/16 1531  BP: 128/74  Pulse: 99  SpO2: 100%  Weight: 146 lb (66.2 kg)  Height: _0  (1.575 m)   RA  Gen: well appearing, no acute distress HENT: NCAT, OP clear, neck supple without masses Eyes: PERRL, EOMi Lymph: no cervical lymphadenopathy PULM: Crackles bases B CV: RRR, no mgr, no JVD GI: BS+, soft, nontender, no hsm Derm: no rash or skin breakdown, clubbing noted fingers MSK: normal bulk and tone Neuro: A&Ox4, CN II-XII intact, strength 5/5 in all 4 extremities Psyche: normal mood and affect   Chest CT images from 2014 reviewed showing diffuse fibrotic lung disease and an upper lobe predominant fashion. Some traction bronchiolectasis noted, some patchy groundglass left greater than right some airspace disease left base     Assessment & Plan:  ILD (interstitial lung disease) ([place]) I have personally reviewed the images from her CT chest from 2015 which showed significant fibrotic lung disease which was not typical for usual interstitial pneumonitis. 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  Will be difficult in this case is sorting out whether or not this condition is contributing to her cough.  Plan: PFT Repeat HRCT Obtain records  from her prior pulmonary doctor  Chronic cough I believe that this is related to postnasal drip in the setting of allergic rhinitis which has been previously untreated. Occult GERD is also possible. However, as discussed below she may have a contribution from the interstitial lung disease.  Plan: Start treatment for allergic rhinitis with Zyrtec and a nasal steroid If no improvement in 3-4 weeks then treat empirically with gastroesophageal reflux therapy Follow-up interstitial lung disease treatment    Current Outpatient Prescriptions:  .  acetaminophen (TYLENOL) 500 MG tablet, Take 500-1,000 mg by mouth every 6 (six) hours as needed  for moderate pain. , Disp: , Rfl:  .  hydroxychloroquine (PLAQUENIL) 200 MG tablet, Take 400 mg by mouth daily., Disp: , Rfl:  .  Multiple Vitamin (MULTIVITAMIN WITH MINERALS) TABS tablet, Take 1 tablet by mouth daily., Disp: , Rfl:  .  Omega-3 Fatty Acids (FISH OIL) 1000 MG CAPS, Take 1 capsule by mouth daily., Disp: , Rfl:  .  pantoprazole (PROTONIX) 40 MG tablet, Take 40 mg by mouth every morning., Disp: , Rfl:  .  Vitamin D, Ergocalciferol, (DRISDOL) 50000 units CAPS capsule, Take 50,000 Units by mouth every 7 (seven) days., Disp: , Rfl:

## 2016-07-24 NOTE — Patient Instructions (Addendum)
For the cough: Use Neil Med rinses with distilled water at least twice per day using the instructions on the package. 1/2 hour after using the [facility] Med rinse, use Nasacort two puffs in each nostril once per day.  Remember that the Nasacort can take 1-2 weeks to work after regular use. Use generic zyrtec (cetirizine) every day.  If this doesn't help, then stop taking it and use chlorpheniramine-phenylephrine combination tablets.  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 Hans Eden office and even where he saw you for this condition previously.  We will see you back in 2-3 weeks to go over these results

## 2016-07-24 NOTE — Assessment & Plan Note (Signed)
I have personally reviewed the images from her CT chest from 2015 which showed significant fibrotic lung disease which was not typical for usual interstitial pneumonitis. 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  Will be difficult in this case is sorting out whether or not this condition is contributing to her cough.  Plan: PFT Repeat HRCT Obtain records from her prior pulmonary doctor

## 2016-08-01 DIAGNOSIS — M5441 Lumbago with sciatica, right side: Secondary | ICD-10-CM | POA: Diagnosis not present

## 2016-08-01 DIAGNOSIS — M329 Systemic lupus erythematosus, unspecified: Secondary | ICD-10-CM | POA: Diagnosis not present

## 2016-08-01 DIAGNOSIS — Z79899 Other long term (current) drug therapy: Secondary | ICD-10-CM | POA: Diagnosis not present

## 2016-08-05 ENCOUNTER — Ambulatory Visit (HOSPITAL_COMMUNITY)
Admission: RE | Admit: 2016-08-05 | Discharge: 2016-08-05 | Disposition: A | Discharge status: Home / Self Care | Payer: Medicare Other | Source: Ambulatory Visit | Attending: Pulmonary Disease | Admitting: Pulmonary Disease

## 2016-08-05 DIAGNOSIS — J849 Interstitial pulmonary disease, unspecified: Secondary | ICD-10-CM | POA: Insufficient documentation

## 2016-08-05 LAB — PULMONARY FUNCTION TEST
DL/VA % pred: 62 %
DL/VA: 2.86 ml/min/mmHg/L
DLCO UNC % PRED: 30 %
DLCO UNC: 6.6 ml/min/mmHg
FEF 25-75 POST: 2.35 L/s
FEF 25-75 Pre: 2.13 L/sec
FEF2575-%Change-Post: 10 %
FEF2575-%PRED-POST: 104 %
FEF2575-%Pred-Pre: 94 %
FEV1-%Change-Post: 1 %
FEV1-%PRED-PRE: 67 %
FEV1-%Pred-Post: 68 %
FEV1-POST: 1.45 L
FEV1-Pre: 1.43 L
FEV1FVC-%Change-Post: 0 %
FEV1FVC-%Pred-Pre: 113 %
FEV6-%Change-Post: 2 %
FEV6-%Pred-Post: 62 %
FEV6-%Pred-Pre: 60 %
FEV6-POST: 1.59 L
FEV6-Pre: 1.55 L
FEV6FVC-%PRED-POST: 103 %
FEV6FVC-%Pred-Pre: 103 %
FVC-%Change-Post: 2 %
FVC-%PRED-POST: 60 %
FVC-%PRED-PRE: 58 %
FVC-Post: 1.59 L
FVC-Pre: 1.55 L
POST FEV6/FVC RATIO: 100 %
PRE FEV1/FVC RATIO: 92 %
PRE FEV6/FVC RATIO: 100 %
Post FEV1/FVC ratio: 91 %
RV % PRED: 68 %
RV: 1.17 L
TLC % pred: 58 %
TLC: 2.8 L

## 2016-08-05 MED ORDER — ALBUTEROL SULFATE (2.5 MG/3ML) 0.083% IN NEBU
2.5000 mg | INHALATION_SOLUTION | Freq: Once | RESPIRATORY_TRACT | Status: AC
  Administered 2016-08-05: 2.5 mg via RESPIRATORY_TRACT

## 2016-08-06 ENCOUNTER — Telehealth: Payer: Self-pay | Admitting: Adult Health

## 2016-08-06 NOTE — Telephone Encounter (Signed)
Notes Recorded by Juanito Doom, MD on 08/05/2016 at 8:15 PM EST A, Please let her know that this showed persistent pulmonary fibrosis, similar to her prior CT scans. The fact that there has been no progression is good. Thanks B   Called and spoke to pt. Informed her of the results and recs per BQ. Pt verbalized understanding and denied any further questions or concerns at this time.

## 2016-08-10 NOTE — Progress Notes (Signed)
Called patient and left voicemail to call back for medical results.

## 2016-08-14 ENCOUNTER — Ambulatory Visit (INDEPENDENT_AMBULATORY_CARE_PROVIDER_SITE_OTHER): Payer: Medicare Other | Admitting: Adult Health

## 2016-08-14 ENCOUNTER — Encounter: Payer: Self-pay | Admitting: Adult Health

## 2016-08-14 DIAGNOSIS — R05 Cough: Secondary | ICD-10-CM | POA: Diagnosis not present

## 2016-08-14 DIAGNOSIS — R053 Chronic cough: Secondary | ICD-10-CM

## 2016-08-14 DIAGNOSIS — J849 Interstitial pulmonary disease, unspecified: Secondary | ICD-10-CM | POA: Diagnosis not present

## 2016-08-14 NOTE — Assessment & Plan Note (Signed)
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  PFT show moderate restricition with severe diffucing defect Cough is some better with trigger control .  Will need to follow up PFT serially .  Refer to pulmonary rehab  O2 sats 100% in office today .   Plan  Patient Instructions  Refer to Pulmonary rehab at [facility] .  Continue on Zyrtec At bedtime  As needed  Drainage .  Continue on saline nasal rinses As needed   Delsym Twice daily  As needed  Cough/congesiton  Continue follow up with Dr. Charlestine Night.  Follow up McQuaid in 4 months and As needed

## 2016-08-14 NOTE — Patient Instructions (Addendum)
Refer to Pulmonary rehab at [facility] .  Continue on Zyrtec At bedtime  As needed  Drainage .  Continue on saline nasal rinses As needed   Delsym Twice daily  As needed  Cough/congesiton  Continue follow up with Dr. Charlestine Night.  Follow up McQuaid in 4 months and As needed

## 2016-08-14 NOTE — Addendum Note (Signed)
Addended by: Parke Poisson E on: 08/14/2016 04:23 PM   Modules accepted: Orders

## 2016-08-14 NOTE — Assessment & Plan Note (Signed)
Most likely secondary to ILD , +/- post nasal drainage   Plan  Patient Instructions  Refer to Pulmonary rehab at [facility] .  Continue on Zyrtec At bedtime  As needed  Drainage .  Continue on saline nasal rinses As needed   Delsym Twice daily  As needed  Cough/congesiton  Continue follow up with Dr. Charlestine Night.  Follow up McQuaid in 4 months and As needed

## 2016-08-14 NOTE — Progress Notes (Signed)
_0  ID: Earmon Phoenix, female    DOB: 1966/05/02, 51 y.o.   MRN: [id number]  Chief Complaint  Patient presents with  . Follow-up    ILD     Referring provider: Soyla Dryer, PA-C  HPI: 51 year old female former smoker with known lupus seen for pulmonary consult 07/14/16 for ILD and Chronic cough   TEST  Pulmonary parenchymal pattern of fibrosis is similar to prior exams and in keeping with fibrotic nonspecific interstitial pneumonitis.  08/14/2016 Follow up ; ILD /PFT  Pt returns for 2 weeks follow up for ILD and PFT results .  He was seen last visit for pulmonary consult for ILD. She has underlying lupus on  Plaquenil She had a follow up HRCT chest on 07/30/16 that showed no significant change of fibrosis .  PFT on 08/05/2016 showed FEV1 at 68%, ratio 91, FVC 60%, no  significant bronchodilator response, TLC 58%, DLCO 30%  She is feeling some better, cough is better. Feels zytec is helping .  No dyspnea at rest . Drives/light housework . Unable to walk long distance , gets winded.    No Known Allergies  Immunization History  Administered Date(s) Administered  . Influenza Split 03/24/2016  . Pneumococcal Polysaccharide-23 07/24/2013    Past Medical History:  Diagnosis Date  . GERD (gastroesophageal reflux disease)   . Lung fibrosis ([place])    follows with a Dr. Ouida Sills, pulmonary in [place], [place]  . 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 Seasonal allergies   . SLE (systemic lupus erythematosus) ([place]) 2010   she was on MTX (d/c due to cytopenia).  She has been on Prednisone and Plaquenil    Tobacco History: History  Smoking Status  . Former Smoker  . Packs/day: 2.00  . Years: 28.00  . Types: Cigarettes  . Quit date: 08/31/2011  Smokeless Tobacco  . Never Used   Counseling given: Not Answered   Outpatient Encounter Prescriptions as of 08/14/2016  Medication Sig  . acetaminophen (TYLENOL) 500 MG tablet Take 500-1,000 mg by mouth  every 6 (six) hours as needed for moderate pain.   . hydroxychloroquine (PLAQUENIL) 200 MG tablet Take 400 mg by mouth daily.  . Multiple Vitamin (MULTIVITAMIN WITH MINERALS) TABS tablet Take 1 tablet by mouth daily.  . Omega-3 Fatty Acids (FISH OIL) 1000 MG CAPS Take 1 capsule by mouth daily.  . pantoprazole (PROTONIX) 40 MG tablet Take 40 mg by mouth every morning.  . Vitamin D, Ergocalciferol, (DRISDOL) 50000 units CAPS capsule Take 50,000 Units by mouth every 7 (seven) days.   No facility-administered encounter medications on file as of 08/14/2016.      Review of Systems  Constitutional:   No  weight loss, night sweats,  Fevers, chills,  +fatigue, or  lassitude.  HEENT:   No headaches,  Difficulty swallowing,  Tooth/dental problems, or  Sore throat,                No sneezing, itching, ear ache,  +nasal congestion, post nasal drip,   CV:  No chest pain,  Orthopnea, PND, swelling in lower extremities, anasarca, dizziness, palpitations, syncope.   GI  No heartburn, indigestion, abdominal pain, nausea, vomiting, diarrhea, change in bowel habits, loss of appetite, bloody stools.   Resp:   No wheezing.  No chest wall deformity  Skin: no rash or lesions.  GU: no dysuria, change in color of urine, no urgency or frequency.  No flank pain, no hematuria   MS:  No  joint pain or swelling.  No decreased range of motion.  No back pain.    Physical Exam  BP 124/70 (BP Location: Right Arm, Cuff Size: Normal)   Pulse 100   Ht 5' 2" (1.575 m)   Wt 145 lb 6.4 oz (66 kg)   LMP 07/01/2013   SpO2 100%   BMI 26.59 kg/m   GEN: A/Ox3; pleasant , NAD   HEENT:  [place]/AT,  EACs-clear, TMs-wnl, NOSE-clear, THROAT-clear, no lesions, no postnasal drip or exudate noted.   NECK:  Supple w/ fair ROM; no JVD; normal carotid impulses w/o bruits; no thyromegaly or nodules palpated; no lymphadenopathy.    RESP  BB  Crackles,  no accessory muscle use, no dullness to percussion  CARD:  RRR, no m/r/g, no  peripheral edema, pulses intact, no cyanosis  +clubbing .   GI:   Soft & nt; nml bowel sounds; no organomegaly or masses detected.   Musco: Warm bil, no deformities or joint swelling noted.   Neuro: alert, no focal deficits noted.    Skin: Warm, facial hyperpigmentation    Lab Results:  CBC    Component Value Date/Time   WBC 5.6 02/14/2014 2039   RBC 5.05 02/14/2014 2039   HGB 13.0 02/14/2014 2039   HCT 39.4 02/14/2014 2039   PLT 235 02/14/2014 2039   MCV 78.0 02/14/2014 2039   MCH 25.7 (L) 02/14/2014 2039   MCHC 33.0 02/14/2014 2039   RDW 14.2 02/14/2014 2039   LYMPHSABS 1.2 02/14/2014 2039   MONOABS 0.6 02/14/2014 2039   EOSABS 0.1 02/14/2014 2039   BASOSABS 0.0 02/14/2014 2039    BMET    Component Value Date/Time   NA 139 02/14/2014 2039   K 3.9 02/14/2014 2039   CL 101 02/14/2014 2039   CO2 26 02/14/2014 2039   GLUCOSE 99 02/14/2014 2039   BUN 5 (L) 02/14/2014 2039   CREATININE 0.52 02/14/2014 2039   CALCIUM 9.1 02/14/2014 2039   GFRNONAA >90 02/14/2014 2039   GFRAA >90 02/14/2014 2039    BNP No results found for: BNP  ProBNP No results found for: PROBNP  Imaging: Ct Chest High Resolution  Result Date: 08/05/2016 CLINICAL DATA:  Cough for 6 months, interstitial lung disease. EXAM: CT CHEST WITHOUT CONTRAST TECHNIQUE: Multidetector CT imaging of the chest was performed following the standard protocol without intravenous contrast. High resolution imaging of the lungs, as well as inspiratory and expiratory imaging, was performed. COMPARISON:  01/23/2014 and 10/26/2011. FINDINGS: Cardiovascular: Pulmonary arteries are enlarged. Heart is at the upper limits of normal in size to mildly enlarged. No pericardial effusion. Mediastinum/Nodes: No pathologically enlarged mediastinal lymph nodes. Hilar regions are difficult to evaluate without IV contrast. Axillary lymph nodes are not enlarged by CT size criteria. Esophagus is grossly unremarkable. Lungs/Pleura: There is  a basilar predominant pattern of subpleural reticulation, ground-glass, traction bronchiectasis/bronchiolectasis and architectural distortion, similar to the prior examinations. The findings are superimposed on paraseptal emphysema. No pleural fluid. Airway is unremarkable. Upper Abdomen: Visualized portions of the liver, adrenal glands, kidneys, spleen, pancreas, stomach and bowel are grossly unremarkable. Upper abdominal lymph nodes are not enlarged by CT size criteria. Musculoskeletal: No worrisome lytic or sclerotic lesions. IMPRESSION: 1. Pulmonary parenchymal pattern of fibrosis is similar to prior exams and in keeping with fibrotic nonspecific interstitial pneumonitis. 2. Enlarged pulmonary arteries, indicative of pulmonary arterial hypertension. Electronically Signed   By: Lorin Picket M.D.   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  PFT show moderate restricition with severe diffucing defect Cough is some better with trigger control .  Will need to follow up PFT serially .  Refer to pulmonary rehab  O2 sats 100% in office today .   Plan  Patient Instructions  Refer to Pulmonary rehab at [facility] .  Continue on Zyrtec At bedtime  As needed  Drainage .  Continue on saline nasal rinses As needed   Delsym Twice daily  As needed  Cough/congesiton  Continue follow up with Dr. Charlestine Night.  Follow up McQuaid in 4 months and As needed       Chronic cough Most likely secondary to ILD , +/- post nasal drainage   Plan  Patient Instructions  Refer to Pulmonary rehab at [facility] .  Continue on Zyrtec At bedtime  As needed  Drainage .  Continue on saline nasal rinses As needed   Delsym Twice daily  As needed  Cough/congesiton  Continue follow up with Dr. Charlestine Night.  Follow up McQuaid in 4 months and As needed         Rexene Edison, NP 08/14/2016

## 2016-08-17 NOTE — Progress Notes (Signed)
Reviewed, agree.  Will follow serial PFT to monitor for ILD activity.

## 2016-10-01 ENCOUNTER — Encounter (HOSPITAL_COMMUNITY): Admission: RE | Admit: 2016-10-01 | Payer: Medicare Other | Source: Ambulatory Visit

## 2016-12-15 ENCOUNTER — Ambulatory Visit (INDEPENDENT_AMBULATORY_CARE_PROVIDER_SITE_OTHER): Payer: Medicare Other | Admitting: Pulmonary Disease

## 2016-12-15 ENCOUNTER — Encounter: Payer: Self-pay | Admitting: Pulmonary Disease

## 2016-12-15 VITALS — BP 124/72 | HR 100 | Ht 62.0 in | Wt 150.0 lb

## 2016-12-15 DIAGNOSIS — R05 Cough: Secondary | ICD-10-CM

## 2016-12-15 DIAGNOSIS — R053 Chronic cough: Secondary | ICD-10-CM

## 2016-12-15 DIAGNOSIS — J849 Interstitial pulmonary disease, unspecified: Secondary | ICD-10-CM

## 2016-12-15 NOTE — Patient Instructions (Signed)
Keep taking your sinus medicines as you are doing  I am going to arrange for an other lung function test in August 2018 and then we will see you after that Let us know if you have any changes in your breathing.

## 2016-12-15 NOTE — Assessment & Plan Note (Signed)
She has what appears to be fibrotic nonspecific interstitial pneumonitis in the setting of underlying lupus. Given the upper lobe predominant nature of the disease this seems like the most likely explanation. A biopsy would be diagnostic but would be too risky and would not change her management.  She seems to have done remarkably well without progression of her disease over the years. We will need to monitor her disease activity with regular lung function testing.  Based on her clinical history it seems that her disease is not progressing and it seems that her lupus is not active right now.  Plan: Repeat lung function test in August 2018 Function function testing seems to be worsening then make a referral to the [facility] Continue regular exercise, stay active Continue follow-up with rheumatology for lupus.

## 2016-12-15 NOTE — Progress Notes (Signed)
Subjective:    Patient ID: Kari Henson, female    DOB: December 16, 1965, 51 y.o.   MRN: [id number]  Synopsis: Referred in 2018 for evaluation of interstitial lung disease in the setting of SLE. Imaging findings suggestive of fibrotic NSIP.  HPI Chief Complaint  Patient presents with  . Follow-up    pt c/o prod cough with yellow mucus, sob with exertion, sinus congestion, pnd.  states warm weather worsens this.     She says that her breathing has been OK.  She says taht the heat makes it worse. The cough is worse when she is outside, particularly when she is in the pollen. Nothing has really worsened since the last time. The cough is better since using the saline rinses.    Past Medical History:  Diagnosis Date  . GERD (gastroesophageal reflux disease)   . Lung fibrosis ([place])    follows with a Dr. Ouida Sills, pulmonary in [place], [place]  . 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 Seasonal allergies   . SLE (systemic lupus erythematosus) ([place]) 2010   she was on MTX (d/c due to cytopenia).  She has been on Prednisone and Plaquenil     Family History  Problem Relation Age of Onset  . Asthma Mother   . Thyroid disease Mother   . Diabetes Mother   . COPD Mother   . Hypertension Mother   . Cirrhosis Father   . Alcohol abuse Father   . Diabetes Maternal Aunt   . Hypertension Sister   . Diabetes Maternal Uncle   . Diabetes Paternal Aunt   . Hypertension Maternal Grandmother   . COPD Maternal Grandmother   . Arthritis Maternal Grandmother        rheumatoid arthritis  . Colon cancer Neg Hx      Social History   Social History  . Marital status: Single    Spouse name: N/A  . Number of children: 1  . Years of education: N/A   Occupational History  .      used to work in [employer]; now applying for disability due to [place] History Main Topics  . Smoking status: Former Smoker    Packs/day: 2.00    Years: 28.00    Types: Cigarettes    Quit date:  08/31/2011  . Smokeless tobacco: Never Used  . Alcohol use No  . Drug use: No  . Sexual activity: Yes   Other Topics Concern  . Not on file   Social History Narrative  . No narrative on file     No Known Allergies   Outpatient Medications Prior to Visit  Medication Sig Dispense Refill  . acetaminophen (TYLENOL) 500 MG tablet Take 500-1,000 mg by mouth every 6 (six) hours as needed for moderate pain.     . hydroxychloroquine (PLAQUENIL) 200 MG tablet Take 400 mg by mouth daily.    . Multiple Vitamin (MULTIVITAMIN WITH MINERALS) TABS tablet Take 1 tablet by mouth daily.    . Omega-3 Fatty Acids (FISH OIL) 1000 MG CAPS Take 1 capsule by mouth daily.    . pantoprazole (PROTONIX) 40 MG tablet Take 40 mg by mouth every morning.    . Vitamin D, Ergocalciferol, (DRISDOL) 50000 units CAPS capsule Take 50,000 Units by mouth every 7 (seven) days.     No facility-administered medications prior to visit.       Review of Systems     Objective:   Physical Exam  Vitals:  12/15/16 1324  BP: 124/72  Pulse: 100  SpO2: 94%  Weight: 150 lb (68 kg)  Height: _0  (1.575 m)   Gen: well appearing HENT: OP clear, TM's clear, neck supple PULM: Crackles bases bilaterally, normal percussion CV: RRR, no mgr, trace edema GI: BS+, soft, nontender Derm: no cyanosis or rash Psyche: normal mood and affect   Chest imaging: February 2019: likely fibrotic NSIP: Images independently reviewed with the patient today in clinic showing honeycombing most prominently in the left upper lobe and also in the bases bilaterally, there are patches of spared, normal lung bilaterally as well. There is no emphysema.  February 2018 pulmonary function testing ratio 91%, FEV1 1.45 L 68% predicted, FVC 1.59 L 60% predicted, total lung capacity 2.80 L 58% predicted, DLCO 6.60 30% predicted       Assessment & Plan:  ILD (interstitial lung disease) ([place]) She has what appears to be fibrotic nonspecific interstitial  pneumonitis in the setting of underlying lupus. Given the upper lobe predominant nature of the disease this seems like the most likely explanation. A biopsy would be diagnostic but would be too risky and would not change her management.  She seems to have done remarkably well without progression of her disease over the years. We will need to monitor her disease activity with regular lung function testing.  Based on her clinical history it seems that her disease is not progressing and it seems that her lupus is not active right now.  Plan: Repeat lung function test in August 2018 Function function testing seems to be worsening then make a referral to the [facility] center Continue regular exercise, stay active Continue follow-up with rheumatology for lupus.  Chronic cough Due to both filed the and postnasal drip from allergic rhinitis. Improved after starting treatment for allergic rhinitis.  Plan: Continue allergic rhinitis regimen.    Current Outpatient Prescriptions:  .  acetaminophen (TYLENOL) 500 MG tablet, Take 500-1,000 mg by mouth every 6 (six) hours as needed for moderate pain. , Disp: , Rfl:  .  cetirizine (ZYRTEC) 10 MG chewable tablet, Chew 10 mg by mouth daily., Disp: , Rfl:  .  hydroxychloroquine (PLAQUENIL) 200 MG tablet, Take 400 mg by mouth daily., Disp: , Rfl:  .  Multiple Vitamin (MULTIVITAMIN WITH MINERALS) TABS tablet, Take 1 tablet by mouth daily., Disp: , Rfl:  .  Omega-3 Fatty Acids (FISH OIL) 1000 MG CAPS, Take 1 capsule by mouth daily., Disp: , Rfl:  .  pantoprazole (PROTONIX) 40 MG tablet, Take 40 mg by mouth every morning., Disp: , Rfl:  .  triamcinolone (NASACORT ALLERGY 24HR) 55 MCG/ACT AERO nasal inhaler, Place 2 sprays into the nose daily., Disp: , Rfl:  .  Vitamin D, Ergocalciferol, (DRISDOL) 50000 units CAPS capsule, Take 50,000 Units by mouth every 7 (seven) days., Disp: , Rfl:

## 2016-12-15 NOTE — Assessment & Plan Note (Signed)
Due to both filed the and postnasal drip from allergic rhinitis. Improved after starting treatment for allergic rhinitis.  Plan: Continue allergic rhinitis regimen.

## 2017-01-11 DIAGNOSIS — K219 Gastro-esophageal reflux disease without esophagitis: Secondary | ICD-10-CM | POA: Diagnosis not present

## 2017-01-11 DIAGNOSIS — Z6826 Body mass index (BMI) 26.0-26.9, adult: Secondary | ICD-10-CM | POA: Diagnosis not present

## 2017-01-11 DIAGNOSIS — Z713 Dietary counseling and surveillance: Secondary | ICD-10-CM | POA: Diagnosis not present

## 2017-01-11 DIAGNOSIS — Z87891 Personal history of nicotine dependence: Secondary | ICD-10-CM | POA: Diagnosis not present

## 2017-01-11 DIAGNOSIS — Z299 Encounter for prophylactic measures, unspecified: Secondary | ICD-10-CM | POA: Diagnosis not present

## 2017-01-11 DIAGNOSIS — E559 Vitamin D deficiency, unspecified: Secondary | ICD-10-CM | POA: Diagnosis not present

## 2017-01-11 DIAGNOSIS — L930 Discoid lupus erythematosus: Secondary | ICD-10-CM | POA: Diagnosis not present

## 2017-01-11 DIAGNOSIS — E785 Hyperlipidemia, unspecified: Secondary | ICD-10-CM | POA: Diagnosis not present

## 2017-01-28 ENCOUNTER — Ambulatory Visit: Payer: Medicare Other | Admitting: Pulmonary Disease

## 2017-03-07 DIAGNOSIS — T7840XA Allergy, unspecified, initial encounter: Secondary | ICD-10-CM | POA: Diagnosis not present

## 2017-03-07 DIAGNOSIS — Z87891 Personal history of nicotine dependence: Secondary | ICD-10-CM | POA: Diagnosis not present

## 2017-03-07 DIAGNOSIS — T656X1A Toxic effect of paints and dyes, not elsewhere classified, accidental (unintentional), initial encounter: Secondary | ICD-10-CM | POA: Diagnosis not present

## 2017-03-07 DIAGNOSIS — Z79899 Other long term (current) drug therapy: Secondary | ICD-10-CM | POA: Diagnosis not present

## 2017-03-07 DIAGNOSIS — R21 Rash and other nonspecific skin eruption: Secondary | ICD-10-CM | POA: Diagnosis not present

## 2017-04-01 DIAGNOSIS — E8940 Asymptomatic postprocedural ovarian failure: Secondary | ICD-10-CM | POA: Diagnosis not present

## 2017-04-01 DIAGNOSIS — Z79899 Other long term (current) drug therapy: Secondary | ICD-10-CM | POA: Diagnosis not present

## 2017-04-13 DIAGNOSIS — Z1231 Encounter for screening mammogram for malignant neoplasm of breast: Secondary | ICD-10-CM | POA: Diagnosis not present

## 2017-04-14 ENCOUNTER — Ambulatory Visit: Payer: Medicare Other | Admitting: Pulmonary Disease

## 2017-04-19 DIAGNOSIS — Z23 Encounter for immunization: Secondary | ICD-10-CM | POA: Diagnosis not present

## 2017-04-23 ENCOUNTER — Ambulatory Visit: Payer: Medicare Other | Admitting: Pulmonary Disease

## 2017-04-23 ENCOUNTER — Ambulatory Visit (INDEPENDENT_AMBULATORY_CARE_PROVIDER_SITE_OTHER): Payer: Medicare Other | Admitting: Pulmonary Disease

## 2017-04-23 ENCOUNTER — Encounter: Payer: Self-pay | Admitting: Pulmonary Disease

## 2017-04-23 ENCOUNTER — Ambulatory Visit (INDEPENDENT_AMBULATORY_CARE_PROVIDER_SITE_OTHER): Payer: Medicare Other | Admitting: *Deleted

## 2017-04-23 VITALS — BP 110/64 | HR 103 | Ht 62.0 in | Wt 153.0 lb

## 2017-04-23 DIAGNOSIS — J849 Interstitial pulmonary disease, unspecified: Secondary | ICD-10-CM

## 2017-04-23 DIAGNOSIS — R05 Cough: Secondary | ICD-10-CM

## 2017-04-23 DIAGNOSIS — R053 Chronic cough: Secondary | ICD-10-CM

## 2017-04-23 LAB — PULMONARY FUNCTION TEST
DL/VA % pred: 72 %
DL/VA: 3.28 ml/min/mmHg/L
DLCO COR: 8.69 ml/min/mmHg
DLCO UNC % PRED: 41 %
DLCO cor % pred: 40 %
DLCO unc: 8.95 ml/min/mmHg
FEF 25-75 Post: 1.82 L/sec
FEF 25-75 Pre: 1.98 L/sec
FEF2575-%Change-Post: -8 %
FEF2575-%PRED-PRE: 88 %
FEF2575-%Pred-Post: 81 %
FEV1-%Change-Post: 0 %
FEV1-%PRED-POST: 65 %
FEV1-%Pred-Pre: 65 %
FEV1-Post: 1.37 L
FEV1-Pre: 1.38 L
FEV1FVC-%Change-Post: 0 %
FEV1FVC-%Pred-Pre: 109 %
FEV6-%Change-Post: 0 %
FEV6-%Pred-Post: 60 %
FEV6-%Pred-Pre: 60 %
FEV6-POST: 1.53 L
FEV6-Pre: 1.53 L
FEV6FVC-%Pred-Post: 103 %
FEV6FVC-%Pred-Pre: 103 %
FVC-%Change-Post: -1 %
FVC-%Pred-Post: 58 %
FVC-%Pred-Pre: 59 %
FVC-Post: 1.53 L
FVC-Pre: 1.55 L
PRE FEV1/FVC RATIO: 89 %
Post FEV1/FVC ratio: 89 %
Post FEV6/FVC ratio: 100 %
Pre FEV6/FVC Ratio: 100 %
RV % pred: 49 %
RV: 0.86 L
TLC % pred: 53 %
TLC: 2.54 L

## 2017-04-23 MED ORDER — BENZONATATE 200 MG PO CAPS: 200.0000 mg | ORAL_CAPSULE | Freq: Three times a day (TID) | ORAL | 2 refills | Status: DC | PRN

## 2017-04-23 NOTE — Progress Notes (Signed)
Subjective:    Patient ID: Kari Henson, female    DOB: [birth year], 51 y.o.   MRN: [id number]  Synopsis: Referred in 2018 for evaluation of interstitial lung disease in the setting of SLE. Imaging findings suggestive of fibrotic NSIP.  HPI Chief Complaint  Patient presents with  . Follow-up    PFT done today. Pt's only complaint is that she is still coughing up yellow phlegm. Denies any CP or SOB.   Cough: > still has this > she says its the same cough she has had for year > she produces some yellow mucus  She reports no shortness of breath.  She remains active.  She has not started an exercise routine yet.  She still has some allergic rhinitis.  She says that the allergic rhinitis regimen we recommended including Nasacort and Zyrtec was helpful for cough but she is not taking it right now.  She has not had bronchitis or pneumonia since the last visit.   Past Medical History:  Diagnosis Date  . GERD (gastroesophageal reflux disease)   . Lung fibrosis ([place])    follows with a Dr. Ouida Sills, pulmonary in [place], [place]  . 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 Seasonal allergies   . SLE (systemic lupus erythematosus) ([place]) 2010   she was on MTX (d/c due to cytopenia).  She has been on Prednisone and Plaquenil        Review of Systems  Constitutional: Negative for chills, fatigue and fever.  HENT: Negative for postnasal drip, rhinorrhea and sinus pain.   Respiratory: Positive for cough. Negative for shortness of breath and wheezing.   Cardiovascular: Negative for chest pain, palpitations and leg swelling.       Objective:   Physical Exam  Vitals:   04/23/17 1400  BP: 110/64  Pulse: (!) 103  SpO2: 97%  Weight: 153 lb (69.4 kg)  Height: _0  (1.575 m)    Gen: well appearing HENT: OP clear, TM's clear, neck supple PULM: Crackles bases bilaterally B, normal percussion CV: RRR, no mgr, trace edema GI: BS+, soft, nontender Derm: clubbing  noted, no cyanosis or rash Psyche: normal mood and affect    Chest imaging: February 2019: likely fibrotic NSIP: Images independently reviewed with the patient today in clinic showing honeycombing most prominently in the left upper lobe and also in the bases bilaterally, there are patches of spared, normal lung bilaterally as well. There is no emphysema.  PFT: February 2018 pulmonary function testing ratio 91%, FEV1 1.45 L 68% predicted, FVC 1.59 L 60% predicted, total lung capacity 2.80 L 58% predicted, DLCO 6.60 30% predicted April 23, 2017: Ratio 89%, FEV1 1.39 L, FVC 1.53 L 58% predicted, total lung capacity 2.54, 53% predicted, DLCO 8.95 41% predicted      Assessment & Plan:   ILD (interstitial lung disease) ([place])  Chronic cough  Discussion: [name] has evidence of interstitial lung disease related to her lupus seen on the CT scan of her chest but fortunately we have not seen any evidence of worsening on her lung function testing.  Her forced vital capacity today is slightly lower than previous, however the diffusion capacity has improved.  Given these mixed results I have no clear evidence that her disease is worsened.  I am encouraged by the fact that she has no symptoms of shortness of breath.  I have no doubt that her chronic cough is related to her interstitial lung disease, though I think allergic rhinitis likely contributes  as well.  Plan: For your lupus associated interstitial lung disease: We will arrange for a lung function test in 6 months We will perform a 6-minute walk test today and again in 6 months Let me know if you have any problems with shortness of breath or worsening cough with  For your cough: Continue to treat allergic rhinitis as follows: Use Neil Med rinses with distilled water at least twice per day using the instructions on the package. 1/2 hour after using the [facility] Med rinse, use Nasacort two puffs in each nostril once per day.  Remember that the  Nasacort can take 1-2 weeks to work after regular use. Use generic zyrtec (cetirizine) every day.  If this doesn't help, then stop taking it and use chlorpheniramine-phenylephrine combination tablets. Use tessalon perles every 8 hours as needed for cough     Current Outpatient Prescriptions:  .  acetaminophen (TYLENOL) 500 MG tablet, Take 500-1,000 mg by mouth every 6 (six) hours as needed for moderate pain. , Disp: , Rfl:  .  cetirizine (ZYRTEC) 10 MG chewable tablet, Chew 10 mg by mouth daily., Disp: , Rfl:  .  hydroxychloroquine (PLAQUENIL) 200 MG tablet, Take 400 mg by mouth daily., Disp: , Rfl:  .  Multiple Vitamin (MULTIVITAMIN WITH MINERALS) TABS tablet, Take 1 tablet by mouth daily., Disp: , Rfl:  .  Omega-3 Fatty Acids (FISH OIL) 1000 MG CAPS, Take 1 capsule by mouth daily., Disp: , Rfl:  .  pantoprazole (PROTONIX) 40 MG tablet, Take 40 mg by mouth every morning., Disp: , Rfl:  .  triamcinolone (NASACORT ALLERGY 24HR) 55 MCG/ACT AERO nasal inhaler, Place 2 sprays into the nose daily., Disp: , Rfl:  .  Vitamin D, Ergocalciferol, (DRISDOL) 50000 units CAPS capsule, Take 50,000 Units by mouth every 7 (seven) days., Disp: , Rfl:

## 2017-04-23 NOTE — Progress Notes (Signed)
SIX MIN WALK 04/23/2017 07/24/2016  Medications Plaquenil 247m, Multivitamin and Protonix 453mwere all taken around 6am this morning.  -  Supplimental Oxygen during Test? (L/min) No No  Laps 8 -  Partial Lap (in Meters) 24 -  Baseline BP (sitting) 114/74 -  Baseline Heartrate 91 -  Baseline Dyspnea (Borg Scale) 0.5 -  Baseline Fatigue (Borg Scale) 1 -  Baseline SPO2 100 -  BP (sitting) 124/80 -  Heartrate 101 -  Dyspnea ([place] Scale) 0.5 -  Fatigue ([place] Scale) 1 -  SPO2 94 -  BP (sitting) 126/80 -  Heartrate 96 -  SPO2 100 -  Stopped or Paused before Six Minutes No -  Interpretation Calf pain -  Distance Completed 408 -  Tech Comments: Patient was able to walk at a steady pace. Denied any symptoms of SOB or chest pains. She did state at the end of her walk that the back of her calves felt tight but she knew that was because of her lack of daily exercise.  Patient was able to complete 3 laps at a normal pace without stopping. Denied any SOB or chest pain during walk.

## 2017-04-23 NOTE — Patient Instructions (Signed)
PFT done today. 

## 2017-04-23 NOTE — Patient Instructions (Signed)
For your lupus associated interstitial lung disease: We will arrange for a lung function test in 6 months We will perform a 6-minute walk test today and again in 6 months Let me know if you have any problems with shortness of breath or worsening cough with  For your cough: Continue to treat allergic rhinitis as follows: Use Neil Med rinses with distilled water at least twice per day using the instructions on the package. 1/2 hour after using the [facility] Med rinse, use Nasacort two puffs in each nostril once per day.  Remember that the Nasacort can take 1-2 weeks to work after regular use. Use generic zyrtec (cetirizine) every day.  If this doesn't help, then stop taking it and use chlorpheniramine-phenylephrine combination tablets. Use tessalon perles every 8 hours as needed for cough

## 2017-04-30 DIAGNOSIS — Z79899 Other long term (current) drug therapy: Secondary | ICD-10-CM | POA: Insufficient documentation

## 2017-04-30 DIAGNOSIS — Z9289 Personal history of other medical treatment: Secondary | ICD-10-CM | POA: Insufficient documentation

## 2017-04-30 DIAGNOSIS — E559 Vitamin D deficiency, unspecified: Secondary | ICD-10-CM | POA: Insufficient documentation

## 2017-04-30 NOTE — Progress Notes (Signed)
Office Visit Note  Patient: Kari Henson             Date of Birth: 04/09/1966           MRN: [id number]             PCP: [name], NP Referring: Monico Blitz, MD Visit Date: 05/04/2017 Occupation: _0 @    Subjective:  New Patient (Initial Visit) (former pt of Dr.Dev., went to Dr.Truslow he retired, returned back to see Dr. Herminio Heads)   History of Present Illness: Kari Henson is a 51 y.o. female  with history of systemic lupus or dermatosis. She was initially diagnosed with lupus by me in 2012. She was under my care for couple of years and then started seeing Dr. Charlestine Night.patient states that she continue Plaquenil with Dr. Charlestine Night. She had no interruption in her therapy so far. She reports that she was diagnosed with interstitial lung disease in 2010. She had been seeing pulmonologist in the past. Now she's been seeing Dr. Lake Bells.she had recent visit with Dr. Lake Bells which I reviewed today. He felt her lung functions are stable and no other treatment is needed at this point. 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 She denies any recent episode of fall malar rash. Although she states she had a reaction to a hair dye which causes rash on her face and her neck. She states she was taking given prednisone for that and the rash is resolved now.  Activities of Daily Living:  Patient reports morning stiffness for 0 minute.   Patient Denies nocturnal pain.  Difficulty dressing/grooming: Denies Difficulty climbing stairs: Denies Difficulty getting out of chair: Denies Difficulty using hands for taps, buttons, cutlery, and/or writing: Denies   Review of Systems  Constitutional: Negative.  Negative for fatigue, night sweats, weight gain, weight loss and weakness.  HENT: Negative.  Negative for mouth sores, trouble swallowing, trouble swallowing, mouth dryness and nose dryness.   Eyes: Negative.  Negative for pain, redness, visual disturbance and dryness.  Respiratory:  Positive for cough. Negative for shortness of breath and difficulty breathing.   Cardiovascular: Negative.  Negative for chest pain, palpitations, hypertension, irregular heartbeat and swelling in legs/feet.  Gastrointestinal: Negative.  Negative for blood in stool, constipation and diarrhea.  Endocrine: Negative for increased urination.  Genitourinary: Negative for vaginal dryness.  Musculoskeletal: Negative.  Negative for arthralgias, joint pain, joint swelling, myalgias, muscle weakness, morning stiffness, muscle tenderness and myalgias.  Skin: Positive for rash. Negative for color change, hair loss, skin tightness, ulcers and sensitivity to sunlight.  Allergic/Immunologic: Negative for susceptible to infections.  Neurological: Negative.  Negative for dizziness, numbness, headaches, memory loss and night sweats.  Hematological: Negative for swollen glands.  Psychiatric/Behavioral: Negative.  Negative for depressed mood and sleep disturbance. The patient is not nervous/anxious.     PMFS History:  Patient Active Problem List   Diagnosis Date Noted  . Vitamin D deficiency 04/30/2017  . High risk medication use 04/30/2017  . History of bone density study 04/30/2017  . Chronic cough 07/24/2016  . ILD (interstitial lung disease) ([place]) 07/24/2016  . Abdominal pain, epigastric 03/29/2014  . Encounter for screening colonoscopy 03/29/2014  . 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 SLE (systemic lupus erythematosus) ([place])   . Lung fibrosis ([place])     Past Medical History:  Diagnosis Date  . GERD (gastroesophageal reflux disease)   . Lung fibrosis ([place])    follows with a Dr. Ouida Sills,  pulmonary in [place], [place]  . 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 Seasonal allergies   . SLE (systemic lupus erythematosus) ([place]) 2010   she was on MTX (d/c due to cytopenia).  She has been on Prednisone and Plaquenil    Family History  Problem Relation Age  of Onset  . Asthma Mother   . Thyroid disease Mother   . Diabetes Mother   . COPD Mother   . Hypertension Mother   . Cirrhosis Father   . Alcohol abuse Father   . Diabetes Maternal Aunt   . Hypertension Sister   . Diabetes Maternal Uncle   . Diabetes Paternal Aunt   . Hypertension Maternal Grandmother   . COPD Maternal Grandmother   . Arthritis Maternal Grandmother        rheumatoid arthritis  . Colon cancer Neg Hx    Past Surgical History:  Procedure Laterality Date  . CESAREAN SECTION    . HERNIA REPAIR    . MULTIPLE TOOTH EXTRACTIONS     Social History   Social History Narrative  . Not on file     Objective: Vital Signs: BP 115/83 (BP Location: Left Arm, Patient Position: Sitting, Cuff Size: Normal)   Pulse 88   Ht _0  (1.575 m)   Wt 152 lb (68.9 kg)   LMP 07/01/2013   BMI 27.80 kg/m    Physical Exam  Constitutional: She is oriented to person, place, and time. She appears well-developed and well-nourished.  HENT:  Head: Normocephalic and atraumatic.  Eyes: Conjunctivae and EOM are normal.  Neck: Normal range of motion.  Cardiovascular: Normal rate, regular rhythm, normal heart sounds and intact distal pulses.  Pulmonary/Chest: Effort normal. She has rales.  Rales bilateral lung bases  Abdominal: Soft. Bowel sounds are normal.  Lymphadenopathy:    She has no cervical adenopathy.  Neurological: She is alert and oriented to person, place, and time.  Skin: Skin is warm and dry. Capillary refill takes less than 2 seconds.  Psychiatric: She has a normal mood and affect. Her behavior is normal.  Nursing note and vitals reviewed.    Musculoskeletal Exam: C-spine and thoracic lumbar spine good range of motion. Shoulder joints elbow joints wrist joint MCPs PIPs DIPs with good range of motion. She does have clubbing of her DIPs. Hip joints knee joints ankles MTPs PIPs DIPs with good range of motion with no synovitis.  CDAI Exam: No CDAI exam completed.     Investigation: Findings:  07/03/2016 lipid panel HDL 34 LDL 99, TSH normal, CBC normal, CMP normal    Imaging: Xr Hand 2 View Left  Result Date: 05/04/2017 No MCP PIP/DIP narrowing or erosive changes were noted. No intercarpal joint space narrowing or erosive changes were noted. Normal x-ray of the hand  Xr Hand 2 View Right  Result Date: 05/04/2017 No MCP PIP/DIP narrowing or erosive changes were noted. No intercarpal joint space narrowing or erosive changes were noted. Normal x-ray of the hand   Speciality Comments: No specialty comments available.    Procedures:  No procedures performed Allergies: Patient has no known allergies.   Assessment / Plan:     Visit Diagnoses: Other systemic lupus erythematosus with other organ involvement (HCC) - History of positive ANA, positive Ro, positive La, positive Smith, positive RNP, elevated ESR, malar rash, photosensitivity, neutropenia, hypocomplementemia -patient has been under care of Dr. Charlestine Night and has been taking Plaquenil 200 mg twice a day. She has had no recurrence of rash  or synovitis. She's been tolerating medication well.Based on her height I will reduce her dose of Plaquenil to one tablet by mouth twice a day Monday through Friday.I also do not have an eye exam on her. She states she's been getting eye exams at [facility]. I've given right exam form and advised her to schedule an appointment with the ophthalmologist. I will obtain following labs today . Plan: Urinalysis, Routine w reflex microscopic, Sedimentation rate, ANA, C3 and C4, Sjogrens syndrome-B extractable nuclear antibody, Sjogrens syndrome-A extractable nuclear antibody, Anti-Smith antibody, RNP Antibody, Anti-scleroderma antibody, Anti-DNA antibody, double-stranded, Cardiolipin antibodies, IgG, IgM, IgA, Beta-2 glycoprotein antibodies, IgG, IgA, IgM, Lupus Anticoagulant Eval w/Reflex  Patient was counseled on the purpose, proper use, and adverse effects of  hydroxychloroquine including nausea/diarrhea, skin rash, headaches, and sun sensitivity.  Discussed importance of annual eye exams while on hydroxychloroquine to monitor to ocular toxicity and discussed importance of frequent laboratory monitoring.  Provided patient with eye exam form for baseline ophthalmologic exam.  Provided patient with educational materials on hydroxychloroquine and answered all questions.  Patient consented to hydroxychloroquine.  Will upload consent in the media tab.      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  ILD (interstitial lung disease) ([place]) -she has crackles in bilateral lung bases. She's only on Plaquenil and not on Imuran. Followed up by Dr. Lake Bells. I will follow Dr. Fransisco Beau recommendation if she needs more aggressive therapy.  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  Other fatigue - Plan: CBC with Differential/Platelet, COMPLETE METABOLIC PANEL WITH GFR, CK, Serum protein electrophoresis with reflex  High risk medication use - Plaquenil 200 mg by mouth twice a day - Plan: CBC with Differential/Platelet, COMPLETE METABOLIC PANEL WITH GFRwill be checked in 3 months again.  MGUS (monoclonal gammopathy of unknown significance): Patient has not seen a hematologist in several years. She did normal IFE in the past.I've advised her to schedule an appointment with hematology.  History of bone density study - July 2016 T score -0.3 at Dr. Manuella Ghazi  Vitamin D deficiency : She is on vitamin D. She states she had recent vitamin D level checked which was normal.   Orders: Orders Placed This Encounter  Procedures  . XR Hand 2 View Right  . XR Hand 2 View Left  . CBC with Differential/Platelet  . COMPLETE METABOLIC PANEL WITH GFR  . Urinalysis, Routine w reflex microscopic  . Sedimentation rate  . CK  . ANA    . C3 and C4  . Sjogrens syndrome-B extractable nuclear antibody  . Sjogrens syndrome-A extractable nuclear antibody  . Anti-Smith antibody  . RNP Antibody  . Anti-scleroderma antibody  . Anti-DNA antibody, double-stranded  . Cardiolipin antibodies, IgG, IgM, IgA  . Beta-2 glycoprotein antibodies  . IgG, IgA, IgM  . Serum protein electrophoresis with reflex  . Lupus Anticoagulant Eval w/Reflex  . CBC with Differential/Platelet  . COMPLETE METABOLIC PANEL WITH GFR   No orders of the defined types were placed in this encounter.   Face-to-face time spent with patient was 50  minutes.greater than 50% of time was spent in counseling and coordination of care.  Follow-Up Instructions: Return for Systemic lupus.   Bo Merino, MD  Note - This record has been created using Editor, commissioning.  Chart creation errors have been  sought, but may not always  have been located. Such creation errors do not reflect on  the standard of medical care.

## 2017-05-04 ENCOUNTER — Ambulatory Visit (INDEPENDENT_AMBULATORY_CARE_PROVIDER_SITE_OTHER): Payer: Self-pay

## 2017-05-04 ENCOUNTER — Ambulatory Visit (INDEPENDENT_AMBULATORY_CARE_PROVIDER_SITE_OTHER): Payer: Medicare Other

## 2017-05-04 ENCOUNTER — Encounter: Payer: Self-pay | Admitting: Rheumatology

## 2017-05-04 ENCOUNTER — Ambulatory Visit (INDEPENDENT_AMBULATORY_CARE_PROVIDER_SITE_OTHER): Payer: Medicare Other | Admitting: Rheumatology

## 2017-05-04 VITALS — BP 115/83 | HR 88 | Ht 62.0 in | Wt 152.0 lb

## 2017-05-04 DIAGNOSIS — D472 Monoclonal gammopathy: Secondary | ICD-10-CM

## 2017-05-04 DIAGNOSIS — Z87891 Personal history of nicotine dependence: Secondary | ICD-10-CM

## 2017-05-04 DIAGNOSIS — M79642 Pain in left hand: Secondary | ICD-10-CM

## 2017-05-04 DIAGNOSIS — R683 Clubbing of fingers: Secondary | ICD-10-CM

## 2017-05-04 DIAGNOSIS — M3219 Other organ or system involvement in systemic lupus erythematosus: Secondary | ICD-10-CM

## 2017-05-04 DIAGNOSIS — M79641 Pain in right hand: Secondary | ICD-10-CM | POA: Diagnosis not present

## 2017-05-04 DIAGNOSIS — Z79899 Other long term (current) drug therapy: Secondary | ICD-10-CM

## 2017-05-04 DIAGNOSIS — L930 Discoid lupus erythematosus: Secondary | ICD-10-CM | POA: Diagnosis not present

## 2017-05-04 DIAGNOSIS — Z9289 Personal history of other medical treatment: Secondary | ICD-10-CM | POA: Diagnosis not present

## 2017-05-04 DIAGNOSIS — E559 Vitamin D deficiency, unspecified: Secondary | ICD-10-CM

## 2017-05-04 DIAGNOSIS — R5383 Other fatigue: Secondary | ICD-10-CM

## 2017-05-04 DIAGNOSIS — J849 Interstitial pulmonary disease, unspecified: Secondary | ICD-10-CM | POA: Diagnosis not present

## 2017-05-04 NOTE — Patient Instructions (Addendum)
Standing Labs We placed an order today for your standing lab work.    Please come back and get your standing labs in 3 months     Hydroxychloroquine tablets What is this medicine? HYDROXYCHLOROQUINE (hye drox ee KLOR oh kwin) is used to treat rheumatoid arthritis and systemic lupus erythematosus. It is also used to treat malaria. This medicine may be used for other purposes; ask your health care provider or pharmacist if you have questions. COMMON BRAND NAME(S): Plaquenil, Quineprox What should I tell my health care provider before I take this medicine? They need to know if you have any of these conditions: -diabetes -eye disease, vision problems -G6PD deficiency -history of blood diseases -history of irregular heartbeat -if you often drink alcohol -kidney disease -liver disease -porphyria -psoriasis -seizures -an unusual or allergic reaction to chloroquine, hydroxychloroquine, other medicines, foods, dyes, or preservatives -pregnant or trying to get pregnant -breast-feeding How should I use this medicine? Take this medicine by mouth with a glass of water. Follow the directions on the prescription label. Avoid taking antacids within 4 hours of taking this medicine. It is best to separate these medicines by at least 4 hours. Do not cut, crush or chew this medicine. You can take it with or without food. If it upsets your stomach, take it with food. Take your medicine at regular intervals. Do not take your medicine more often than directed. Take all of your medicine as directed even if you think you are better. Do not skip doses or stop your medicine early. Talk to your pediatrician regarding the use of this medicine in children. While this drug may be prescribed for selected conditions, precautions do apply. Overdosage: If you think you have taken too much of this medicine contact a poison control center or emergency room at once. NOTE: This medicine is only for you. Do not share this  medicine with others. What if I miss a dose? If you miss a dose, take it as soon as you can. If it is almost time for your next dose, take only that dose. Do not take double or extra doses. What may interact with this medicine? Do not take this medicine with any of the following medications: -cisapride -dofetilide -dronedarone -live virus vaccines -penicillamine -pimozide -thioridazine -ziprasidone This medicine may also interact with the following medications: -ampicillin -antacids -cimetidine -cyclosporine -digoxin -medicines for diabetes, like insulin, glipizide, glyburide -medicines for seizures like carbamazepine, phenobarbital, phenytoin -mefloquine -methotrexate -other medicines that prolong the QT interval (cause an abnormal heart rhythm) -praziquantel This list may not describe all possible interactions. Give your health care provider a list of all the medicines, herbs, non-prescription drugs, or dietary supplements you use. Also tell them if you smoke, drink alcohol, or use illegal drugs. Some items may interact with your medicine. What should I watch for while using this medicine? Tell your doctor or healthcare professional if your symptoms do not start to get better or if they get worse. Avoid taking antacids within 4 hours of taking this medicine. It is best to separate these medicines by at least 4 hours. Tell your doctor or health care professional right away if you have any change in your eyesight. Your vision and blood may be tested before and during use of this medicine. This medicine can make you more sensitive to the sun. Keep out of the sun. If you cannot avoid being in the sun, wear protective clothing and use sunscreen. Do not use sun lamps or tanning beds/booths. What side  effects may I notice from receiving this medicine? Side effects that you should report to your doctor or health care professional as soon as possible: -allergic reactions like skin rash,  itching or hives, swelling of the face, lips, or tongue -changes in vision -decreased hearing or ringing of the ears -redness, blistering, peeling or loosening of the skin, including inside the mouth -seizures -sensitivity to light -signs and symptoms of a dangerous change in heartbeat or heart rhythm like chest pain; dizziness; fast or irregular heartbeat; palpitations; feeling faint or lightheaded, falls; breathing problems -signs and symptoms of liver injury like dark yellow or brown urine; general ill feeling or flu-like symptoms; light-colored stools; loss of appetite; nausea; right upper belly pain; unusually weak or tired; yellowing of the eyes or skin -signs and symptoms of low blood sugar such as feeling anxious; confusion; dizziness; increased hunger; unusually weak or tired; sweating; shakiness; cold; irritable; headache; blurred vision; fast heartbeat; loss of consciousness -uncontrollable head, mouth, neck, arm, or leg movements Side effects that usually do not require medical attention (report to your doctor or health care professional if they continue or are bothersome): -anxious -diarrhea -dizziness -hair loss -headache -irritable -loss of appetite -nausea, vomiting -stomach pain This list may not describe all possible side effects. Call your doctor for medical advice about side effects. You may report side effects to FDA at 1-800-FDA-1088. Where should I keep my medicine? Keep out of the reach of children. In children, this medicine can cause overdose with small doses. Store at room temperature between 15 and 30 degrees C (59 and 86 degrees F). Protect from moisture and light. Throw away any unused medicine after the expiration date. NOTE: This sheet is a summary. It may not cover all possible information. If you have questions about this medicine, talk to your doctor, pharmacist, or health care provider.  2018 Elsevier/Gold Standard (2016-01-29 14:16:15)   We have open  lab Monday through Friday from 8:30-11:30 AM and 1:30-4 PM at the office of Dr. Bo Merino.   The office is located at [address] [place], [place], [place] [address] No appointment is necessary.   Labs are drawn by [facility].  You may receive a bill from [facility] for your lab work. If you have any questions regarding directions or hours of operation,  please call [phone].

## 2017-05-04 NOTE — Progress Notes (Signed)
Patient was counseled on the purpose, proper use, and adverse effects of hydroxychloroquine including nausea/diarrhea, skin rash, headaches, and sun sensitivity.  Discussed importance of annual eye exams while on hydroxychloroquine to monitor to ocular toxicity and discussed importance of frequent laboratory monitoring.  Provided patient with eye exam form for baseline ophthalmologic exam.  Provided patient with educational materials on hydroxychloroquine and answered all questions.  Patient consented to hydroxychloroquine.  Will upload consent in the media tab.

## 2017-05-05 NOTE — Progress Notes (Signed)
Labs are consistent with lupus. She should continue Plaquenil as prescribed by Dr. Charlestine Night.

## 2017-05-05 NOTE — Progress Notes (Signed)
Will discuss at follow-up visit. Please add aldolase to her labs.

## 2017-05-10 LAB — CBC WITH DIFFERENTIAL/PLATELET
BASOS PCT: 1.4 %
Basophils Absolute: 69 cells/uL (ref 0–200)
Eosinophils Absolute: 519 cells/uL — ABNORMAL HIGH (ref 15–500)
Eosinophils Relative: 10.6 %
HEMATOCRIT: 41.9 % (ref 35.0–45.0)
Hemoglobin: 13.5 g/dL (ref 11.7–15.5)
Lymphs Abs: 1823 cells/uL (ref 850–3900)
MCH: 25.9 pg — ABNORMAL LOW (ref 27.0–33.0)
MCHC: 32.2 g/dL (ref 32.0–36.0)
MCV: 80.4 fL (ref 80.0–100.0)
MONOS PCT: 12.2 %
MPV: 11.6 fL (ref 7.5–12.5)
Neutro Abs: 1891 cells/uL (ref 1500–7800)
Neutrophils Relative %: 38.6 %
Platelets: 286 10*3/uL (ref 140–400)
RBC: 5.21 10*6/uL — ABNORMAL HIGH (ref 3.80–5.10)
RDW: 13.3 % (ref 11.0–15.0)
Total Lymphocyte: 37.2 %
WBC: 4.9 10*3/uL (ref 3.8–10.8)
WBCMIX: 598 {cells}/uL (ref 200–950)

## 2017-05-10 LAB — TEST AUTHORIZATION

## 2017-05-10 LAB — URINALYSIS, ROUTINE W REFLEX MICROSCOPIC
BACTERIA UA: NONE SEEN /HPF
BILIRUBIN URINE: NEGATIVE
Glucose, UA: NEGATIVE
HYALINE CAST: NONE SEEN /LPF
Hgb urine dipstick: NEGATIVE
Ketones, ur: NEGATIVE
Nitrite: NEGATIVE
Protein, ur: NEGATIVE
RBC / HPF: NONE SEEN /HPF (ref 0–2)
SPECIFIC GRAVITY, URINE: 1.012 (ref 1.001–1.03)
pH: 6 (ref 5.0–8.0)

## 2017-05-10 LAB — ANTI-NUCLEAR AB-TITER (ANA TITER): ANA Titer 1: >=1:1280 {titer} — AB

## 2017-05-10 LAB — IGG, IGA, IGM
IGM, SERUM: 728 mg/dL — AB (ref 48–271)
IgG (Immunoglobin G), Serum: 1654 mg/dL — ABNORMAL HIGH (ref 694–1618)
Immunoglobulin A: 296 mg/dL (ref 81–463)

## 2017-05-10 LAB — PROTEIN ELECTROPHORESIS, SERUM, WITH REFLEX
ABNORMAL PROTEIN BAND1: 0.6 g/dL — AB
ALPHA 2: 1.1 g/dL — AB (ref 0.5–0.9)
Albumin ELP: 4.2 g/dL (ref 3.8–4.8)
Alpha 1: 0.3 g/dL (ref 0.2–0.3)
BETA GLOBULIN: 0.5 g/dL (ref 0.4–0.6)
Beta 2: 0.9 g/dL — ABNORMAL HIGH (ref 0.2–0.5)
GAMMA GLOBULIN: 1.5 g/dL (ref 0.8–1.7)
TOTAL PROTEIN: 8.6 g/dL — AB (ref 6.1–8.1)

## 2017-05-10 LAB — LUPUS ANTICOAGULANT EVAL W/ REFLEX
DRVVT SCREEN: 38 s (ref <=45)
PTT LA SCREEN: 33 s (ref <=40)

## 2017-05-10 LAB — COMPLETE METABOLIC PANEL WITH GFR
AG RATIO: 1 (calc) (ref 1.0–2.5)
ALBUMIN MSPROF: 4.2 g/dL (ref 3.6–5.1)
ALKALINE PHOSPHATASE (APISO): 97 U/L (ref 33–130)
ALT: 11 U/L (ref 6–29)
AST: 21 U/L (ref 10–35)
BILIRUBIN TOTAL: 0.4 mg/dL (ref 0.2–1.2)
BUN: 9 mg/dL (ref 7–25)
CO2: 30 mmol/L (ref 20–32)
Calcium: 10.2 mg/dL (ref 8.6–10.4)
Chloride: 100 mmol/L (ref 98–110)
Creat: 0.73 mg/dL (ref 0.50–1.05)
GFR, EST AFRICAN AMERICAN: 110 mL/min/{1.73_m2} (ref >=60)
GFR, Est Non African American: 95 mL/min/{1.73_m2} (ref >=60)
Globulin: 4.2 g/dL (calc) — ABNORMAL HIGH (ref 1.9–3.7)
Glucose, Bld: 79 mg/dL (ref 65–99)
POTASSIUM: 3.9 mmol/L (ref 3.5–5.3)
SODIUM: 138 mmol/L (ref 135–146)
TOTAL PROTEIN: 8.4 g/dL — AB (ref 6.1–8.1)

## 2017-05-10 LAB — C3 AND C4
C3 COMPLEMENT: 155 mg/dL (ref 83–193)
C4 Complement: 17 mg/dL (ref 15–57)

## 2017-05-10 LAB — ANTI-DNA ANTIBODY, DOUBLE-STRANDED

## 2017-05-10 LAB — CARDIOLIPIN ANTIBODIES, IGG, IGM, IGA: Anticardiolipin IgA: <11 [APL'U]

## 2017-05-10 LAB — IFE INTERPRETATION: Immunofix Electr Int: DETECTED

## 2017-05-10 LAB — BETA-2 GLYCOPROTEIN ANTIBODIES: Beta-2 Glyco I IgG: <9 SGU (ref <=20)

## 2017-05-10 LAB — ANTI-SCLERODERMA ANTIBODY: SCLERODERMA (SCL-70) (ENA) ANTIBODY, IGG: NEGATIVE AI

## 2017-05-10 LAB — SJOGRENS SYNDROME-A EXTRACTABLE NUCLEAR ANTIBODY: SSA (RO) (ENA) ANTIBODY, IGG: POSITIVE AI — AB

## 2017-05-10 LAB — ALDOLASE

## 2017-05-10 LAB — ANA: ANA: POSITIVE — AB

## 2017-05-10 LAB — SEDIMENTATION RATE: SED RATE: 55 mm/h — AB (ref 0–30)

## 2017-05-10 LAB — CK: Total CK: 171 U/L — ABNORMAL HIGH (ref 29–143)

## 2017-05-10 LAB — SJOGRENS SYNDROME-B EXTRACTABLE NUCLEAR ANTIBODY: SSB (LA) (ENA) ANTIBODY, IGG: NEGATIVE AI

## 2017-05-10 LAB — ANTI-SMITH ANTIBODY: ENA SM Ab Ser-aCnc: >8 AI — AB

## 2017-05-10 LAB — RNP ANTIBODY: Ribonucleic Protein(ENA) Antibody, IgG: 6 AI — AB

## 2017-06-04 DIAGNOSIS — L930 Discoid lupus erythematosus: Secondary | ICD-10-CM | POA: Insufficient documentation

## 2017-06-04 DIAGNOSIS — R683 Clubbing of fingers: Secondary | ICD-10-CM | POA: Insufficient documentation

## 2017-06-04 NOTE — Progress Notes (Signed)
Office Visit Note  Patient: Kari Henson             Date of Birth: 10-16-1965           MRN: [id number]             PCP: [name], NP Referring: [name], NP Visit Date: 06/10/2017 Occupation: _0 @    Subjective:  Joint pain and rash   History of Present Illness: Kari Henson is a 51 y.o. female with history of systemic lupus or dermatosis, discoid lupus and interstitial lung disease. According to her she had recent flare with rash on her forearms and stiffness in her joints. The symptoms have gradually improved. She states the rash was pruritic. She has shortness of breath only on increased exertion. She has not noticed any worsening of her pulmonary symptoms.  Activities of Daily Living:  Patient reports morning stiffness for 0 minute.   Patient Denies nocturnal pain.  Difficulty dressing/grooming: Denies Difficulty climbing stairs: Denies Difficulty getting out of chair: Denies Difficulty using hands for taps, buttons, cutlery, and/or writing: Denies   Review of Systems  Constitutional: Negative for fatigue, night sweats, weight gain, weight loss and weakness.  HENT: Negative for mouth sores, trouble swallowing, trouble swallowing, mouth dryness and nose dryness.   Eyes: Negative for pain, redness, visual disturbance and dryness.  Respiratory: Positive for shortness of breath. Negative for cough and difficulty breathing.        Only on exertion  Cardiovascular: Negative for chest pain, palpitations, hypertension, irregular heartbeat and swelling in legs/feet.  Gastrointestinal: Negative for blood in stool, constipation and diarrhea.  Endocrine: Negative for increased urination.  Genitourinary: Negative for vaginal dryness.  Musculoskeletal: Positive for arthralgias and joint pain. Negative for joint swelling, myalgias, muscle weakness, morning stiffness, muscle tenderness and myalgias.  Skin: Positive for color change and rash. Negative for hair loss,  skin tightness, ulcers and sensitivity to sunlight.  Allergic/Immunologic: Negative for susceptible to infections.  Neurological: Negative for dizziness, memory loss and night sweats.  Hematological: Negative for swollen glands.  Psychiatric/Behavioral: Negative for depressed mood and sleep disturbance. The patient is not nervous/anxious.     PMFS History:  Patient Active Problem List   Diagnosis Date Noted  . 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 Vitamin D deficiency 04/30/2017  . High risk medication use 04/30/2017  . History of bone density study 04/30/2017  . Chronic cough 07/24/2016  . ILD (interstitial lung disease) ([place]) 07/24/2016  . Abdominal pain, epigastric 03/29/2014  . Encounter for screening colonoscopy 03/29/2014  . 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 SLE (systemic lupus erythematosus) ([place])   . Lung fibrosis ([place])     Past Medical History:  Diagnosis Date  . GERD (gastroesophageal reflux disease)   . Lung fibrosis ([place])    follows with a Dr. Ouida Sills, pulmonary in [place], [place]  . 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 Seasonal allergies   . SLE (systemic lupus erythematosus) ([place]) 2010   she was on MTX (d/c due to cytopenia).  She has been on Prednisone and Plaquenil    Family History  Problem Relation Age of Onset  . Asthma Mother   . Thyroid disease Mother   . Diabetes Mother   . COPD Mother   . Hypertension Mother   . Cirrhosis Father   . Alcohol abuse Father   . Diabetes Maternal Aunt   . Hypertension Sister   .  Diabetes Maternal Uncle   . Diabetes Paternal Aunt   . Hypertension Maternal Grandmother   . COPD Maternal Grandmother   . Arthritis Maternal Grandmother        rheumatoid arthritis  . Healthy Son   . Colon cancer Neg Hx    Past Surgical History:  Procedure Laterality Date  . CESAREAN SECTION    . COLONOSCOPY N/A 04/27/2014   SLF:  1. one colon polyp  removed 2 The left colonis redundant 3. the examined terminal ileum apperared to be  normal 3. small internal hemorrohoids.  . ESOPHAGOGASTRODUODENOSCOPY N/A 04/27/2014   SLF: 1. No obvious source for abdominal pain identified. 2. Non-erosive gastritis (inflammation) was found in the gastric antrum; multiple biopsies were performed.  Marland Kitchen HERNIA REPAIR    . MULTIPLE TOOTH EXTRACTIONS     Social History   Social History Narrative  . Not on file     Objective: Vital Signs: BP 106/88 (BP Location: Left Arm, Patient Position: Sitting, Cuff Size: Normal)   Pulse 92   Resp 18   Ht _0  (1.575 m)   Wt 157 lb (71.2 kg)   LMP 07/01/2013   BMI 28.72 kg/m    Physical Exam  Constitutional: She is oriented to person, place, and time. She appears well-developed and well-nourished.  HENT:  Head: Normocephalic and atraumatic.  Eyes: Conjunctivae and EOM are normal.  Neck: Normal range of motion.  Cardiovascular: Normal rate, regular rhythm, normal heart sounds and intact distal pulses.  Pulmonary/Chest: Effort normal and breath sounds normal.  Abdominal: Soft. Bowel sounds are normal.  Lymphadenopathy:    She has no cervical adenopathy.  Neurological: She is alert and oriented to person, place, and time.  Skin: Skin is warm and dry. Capillary refill takes less than 2 seconds. There is erythema.  Mild erythema over right forearm  Psychiatric: She has a normal mood and affect. Her behavior is normal.  Nursing note and vitals reviewed.    Musculoskeletal Exam: C-spine and thoracic lumbar spine good range of motion. Shoulder joints elbow joints wrist joint MCPs PIPs DIPs are good range of motion with no synovitis she is some tenderness on palpation over right second MCP and right fourth PIP joint. Hip joints, knee joints, ankles MTPs PIPs with good range of motion with no synovitis.  CDAI Exam: CDAI Homunculus Exam:   Tenderness:  Right hand: 2nd MCP and 4th PIP  Joint Counts:  CDAI  Tender Joint count: 2 CDAI Swollen Joint count: 0  Global Assessments:  Patient Global Assessment: 2 Provider Global Assessment: 2  CDAI Calculated Score: 6    Investigation: No additional findings. CBC Latest Ref Rng & Units 05/04/2017 02/14/2014  WBC 3.8 - 10.8 Thousand/uL 4.9 5.6  Hemoglobin 11.7 - 15.5 g/dL 13.5 13.0  Hematocrit 35.0 - 45.0 % 41.9 39.4  Platelets 140 - 400 Thousand/uL 286 235   CMP     Component Value Date/Time   NA 138 05/04/2017 0942   K 3.9 05/04/2017 0942   CL 100 05/04/2017 0942   CO2 30 05/04/2017 0942   GLUCOSE 79 05/04/2017 0942   BUN 9 05/04/2017 0942   CREATININE 0.73 05/04/2017 0942   CALCIUM 10.2 05/04/2017 0942   PROT 8.4 (H) 05/04/2017 0942   PROT 8.6 (H) 05/04/2017 0942   ALBUMIN 3.7 02/14/2014 2039   AST 21 05/04/2017 0942   AST 29 03/14/2014   ALT 11 05/04/2017 0942   ALT 23 02/19/2014   ALKPHOS 114 03/14/2014  BILITOT 0.4 05/04/2017 0942   BILITOT 0.4 03/14/2014   GFRNONAA 95 05/04/2017 0942   GFRAA 110 05/04/2017 0942  UA -, CK 171 elevated, IgG elevated, IFE IgM Monoclonal protein detected ANA >1:1280, double-stranded DNA negative, SS Apositive, SSB negative, Smith positive, RNP positive, SCL 70 negative, C3-C4 normal, beta-2 GP one negative, anticardiolipin negative, lupus anticoagulant negative, ESR 55 Imaging: No results found.  Speciality Comments: No specialty comments available.    Procedures:  No procedures performed Allergies: Patient has no known allergies.   Assessment / Plan:     Visit Diagnoses: Other systemic lupus erythematosus with other organ involvement (HCC) - Positive ANA, DS DNA, Ro, Smith, RNP,elevated ESR, malar rash, photosensitivity, neutropenia, hypocomplementemia. Her most recent labs showed elevated ANA double-stranded DNA was negative she still have a Ro and his Smith positive. Her sedimentation rate is a still elevated. I detailed discussion regarding her treatment. We discussed the possible  option of adding Imuran. Indications side effects contraindications were discussed at length. She wants to hold off the treatment at this point. She states she would like to discuss with Dr. Lake Bells before starting on Imuran.  High risk medication use - On Plaquenil 200 mg by mouth twice a day. She states her eye exam is due today. I plan to repeat her labs at follow-up visit.  Discoid lupus: She has no active rash currently. She states she had mild flare recently which resolved.  ILD (interstitial lung disease) ([place]): She's been followed by Dr. Lake Bells. At this time based on his note her lung disease is stable. I would like his opinion regarding adding Imuran.  Clubbing of fingers - Former smoker. History of COPD  MGUS (monoclonal gammopathy of unknown significance)  Vitamin D deficiency - July 2016 T score -0.3 at Dr. Manuella Ghazi . She is on vitamin D supplement.   Orders: No orders of the defined types were placed in this encounter.  No orders of the defined types were placed in this encounter.   Face-to-face time spent with patient was 30 minutes. Greater than 50% of time was spent in counseling and coordination of care.  Follow-Up Instructions: Return in about 5 months (around 11/08/2017) for Systemic lupus, ILD.   Bo Merino, MD  Note - This record has been created using Editor, commissioning.  Chart creation errors have been sought, but may not always  have been located. Such creation errors do not reflect on  the standard of medical care.

## 2017-06-10 ENCOUNTER — Encounter: Payer: Self-pay | Admitting: Rheumatology

## 2017-06-10 ENCOUNTER — Ambulatory Visit (INDEPENDENT_AMBULATORY_CARE_PROVIDER_SITE_OTHER): Payer: Medicare Other | Admitting: Rheumatology

## 2017-06-10 ENCOUNTER — Encounter (INDEPENDENT_AMBULATORY_CARE_PROVIDER_SITE_OTHER): Payer: Medicare Other | Admitting: Ophthalmology

## 2017-06-10 VITALS — BP 106/88 | HR 92 | Resp 18 | Ht 62.0 in | Wt 157.0 lb

## 2017-06-10 DIAGNOSIS — Z79899 Other long term (current) drug therapy: Secondary | ICD-10-CM

## 2017-06-10 DIAGNOSIS — M3219 Other organ or system involvement in systemic lupus erythematosus: Secondary | ICD-10-CM | POA: Diagnosis not present

## 2017-06-10 DIAGNOSIS — L930 Discoid lupus erythematosus: Secondary | ICD-10-CM

## 2017-06-10 DIAGNOSIS — E559 Vitamin D deficiency, unspecified: Secondary | ICD-10-CM | POA: Diagnosis not present

## 2017-06-10 DIAGNOSIS — H2513 Age-related nuclear cataract, bilateral: Secondary | ICD-10-CM

## 2017-06-10 DIAGNOSIS — R683 Clubbing of fingers: Secondary | ICD-10-CM

## 2017-06-10 DIAGNOSIS — H43813 Vitreous degeneration, bilateral: Secondary | ICD-10-CM

## 2017-06-10 DIAGNOSIS — J849 Interstitial pulmonary disease, unspecified: Secondary | ICD-10-CM | POA: Diagnosis not present

## 2017-06-10 DIAGNOSIS — D472 Monoclonal gammopathy: Secondary | ICD-10-CM

## 2017-06-10 DIAGNOSIS — M069 Rheumatoid arthritis, unspecified: Secondary | ICD-10-CM

## 2017-06-10 NOTE — Patient Instructions (Signed)
Azathioprine tablets What is this medicine? AZATHIOPRINE (ay za THYE oh preen) suppresses the immune system. It is used to prevent organ rejection after a transplant. It is also used to treat rheumatoid arthritis. This medicine may be used for other purposes; ask your health care provider or pharmacist if you have questions. COMMON BRAND NAME(S): Azasan, Imuran What should I tell my health care provider before I take this medicine? They need to know if you have any of these conditions: -infection -kidney disease -liver disease -an unusual or allergic reaction to azathioprine, other medicines, lactose, foods, dyes, or preservatives -pregnant or trying to get pregnant -breast feeding How should I use this medicine? Take this medicine by mouth with a full glass of water. Follow the directions on the prescription label. Take your medicine at regular intervals. Do not take your medicine more often than directed. Continue to take your medicine even if you feel better. Do not stop taking except on your doctor's advice. Talk to your pediatrician regarding the use of this medicine in children. Special care may be needed. Overdosage: If you think you have taken too much of this medicine contact a poison control center or emergency room at once. NOTE: This medicine is only for you. Do not share this medicine with others. What if I miss a dose? If you miss a dose, take it as soon as you can. If it is almost time for your next dose, take only that dose. Do not take double or extra doses. What may interact with this medicine? Do not take this medicine with any of the following medications: -febuxostat -mercaptopurine This medicine may also interact with the following medications: -allopurinol -aminosalicylates like sulfasalazine, mesalamine, balsalazide, and olsalazine -leflunomide -medicines called ACE inhibitors like benazepril, captopril, enalapril, fosinopril, quinapril, lisinopril, ramipril, and  trandolapril -mycophenolate -sulfamethoxazole; trimethoprim -vaccines -warfarin This list may not describe all possible interactions. Give your health care provider a list of all the medicines, herbs, non-prescription drugs, or dietary supplements you use. Also tell them if you smoke, drink alcohol, or use illegal drugs. Some items may interact with your medicine. What should I watch for while using this medicine? Visit your doctor or health care professional for regular checks on your progress. You will need frequent blood checks during the first few months you are receiving the medicine. If you get a cold or other infection while receiving this medicine, call your doctor or health care professional. Do not treat yourself. The medicine may increase your risk of getting an infection. Women should inform their doctor if they wish to become pregnant or think they might be pregnant. There is a potential for serious side effects to an unborn child. Talk to your health care professional or pharmacist for more information. Men may have a reduced sperm count while they are taking this medicine. Talk to your health care professional for more information. This medicine may increase your risk of getting certain kinds of cancer. Talk to your doctor about healthy lifestyle choices, important screenings, and your risk. What side effects may I notice from receiving this medicine? Side effects that you should report to your doctor or health care professional as soon as possible: -allergic reactions like skin rash, itching or hives, swelling of the face, lips, or tongue -changes in vision -confusion -fever, chills, or any other sign of infection -loss of balance or coordination -severe stomach pain -unusual bleeding, bruising -unusually weak or tired -vomiting -yellowing of the eyes or skin Side effects that usually do  not require medical attention (report to your doctor or health care professional if they  continue or are bothersome): -hair loss -nausea This list may not describe all possible side effects. Call your doctor for medical advice about side effects. You may report side effects to FDA at 1-800-FDA-1088. Where should I keep my medicine? Keep out of the reach of children. Store at room temperature between 15 and 25 degrees C (59 and 77 degrees F). Protect from light. Throw away any unused medicine after the expiration date. NOTE: This sheet is a summary. It may not cover all possible information. If you have questions about this medicine, talk to your doctor, pharmacist, or health care provider.  2018 Elsevier/Gold Standard (2013-10-10 12:00:31)

## 2017-06-28 IMAGING — CT CT CHEST HIGH RESOLUTION W/O CM
2 of 3 series · 15 of 36 positions shown, 18 images · non-contrast
Comparison: 01/23/2014 and 10/26/2011.

CLINICAL DATA: Cough for 6 months, interstitial lung disease.

EXAM:
CT CHEST WITHOUT CONTRAST
TECHNIQUE: Multidetector CT imaging of the chest was performed following the
standard protocol without intravenous contrast. High resolution
imaging of the lungs, as well as inspiratory and expiratory imaging,
was performed.

[Series 3: thorax · axial · 0.62mm/px · z∈[+1518,+1750]mm · 12 of 138 slices shown, 15 images]
[im 11/138  mediastinal]
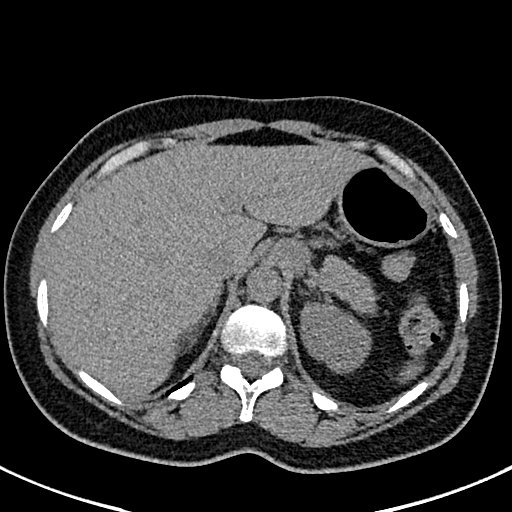
[im 11/138  lung]
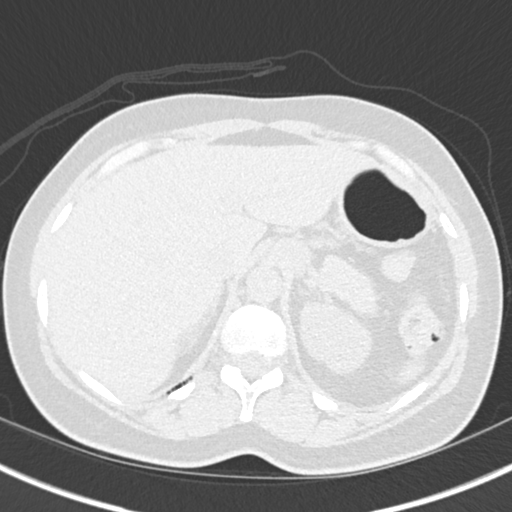
[im 21/138  lung]
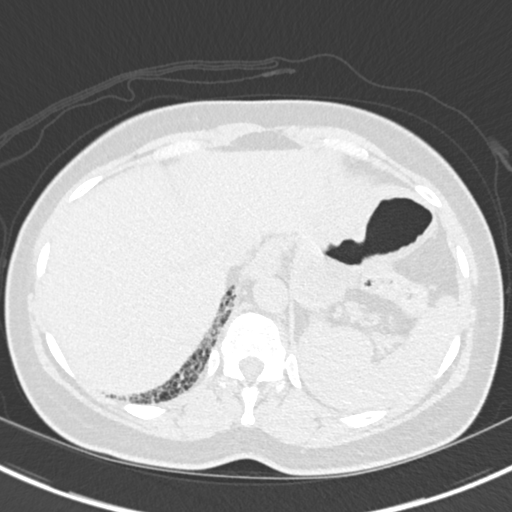
[im 31/138  lung]
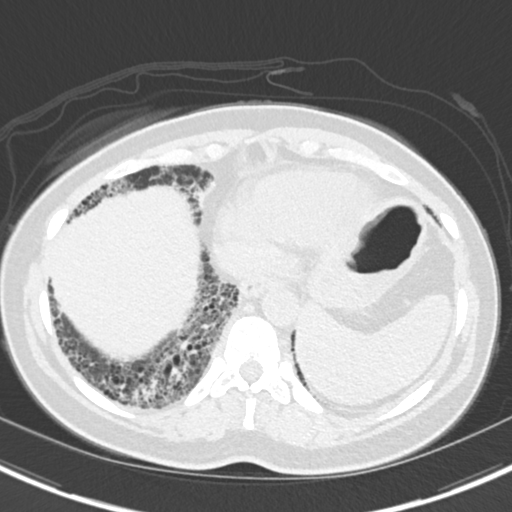
[im 41/138  lung]
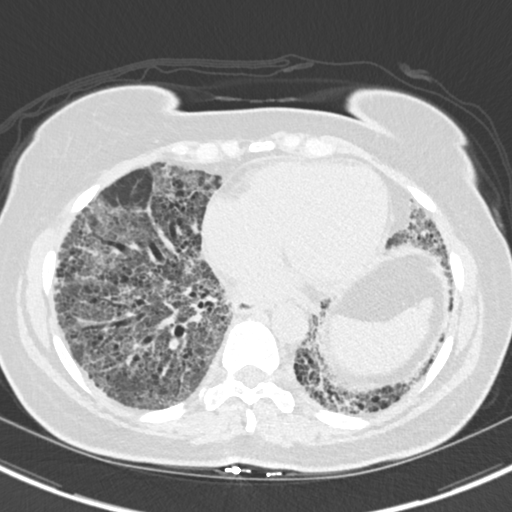
[im 51/138  mediastinal]
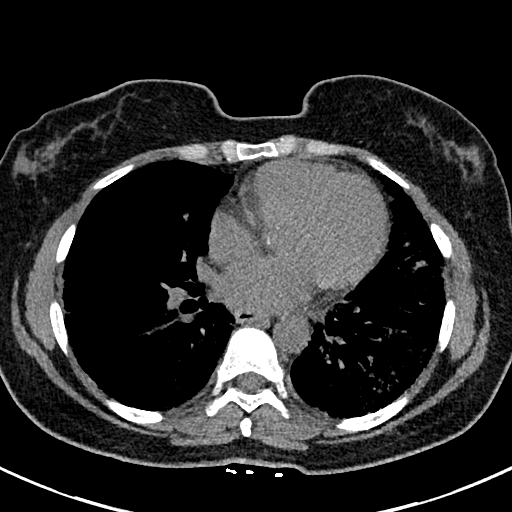
[im 51/138  lung]
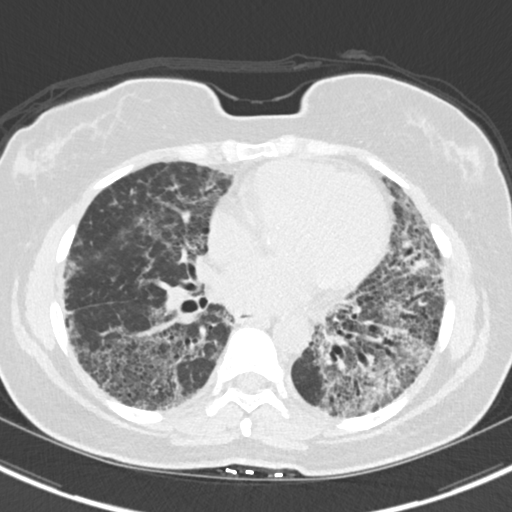
[im 61/138  lung]
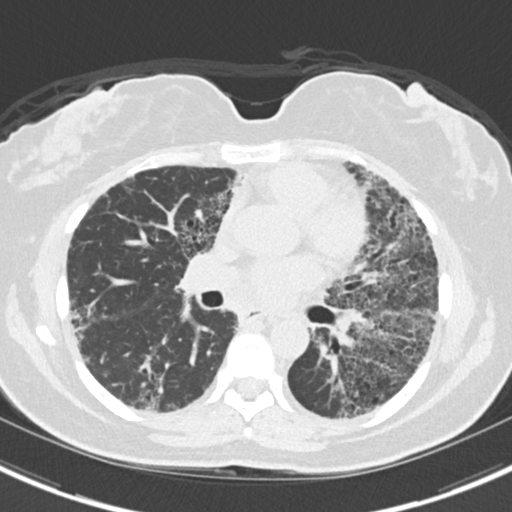
[im 77/138  lung]
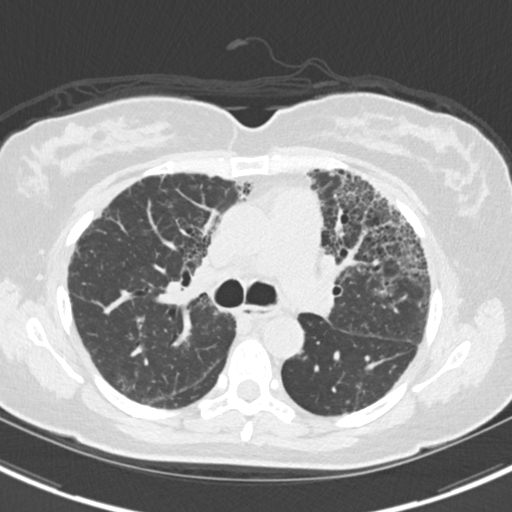
[im 87/138  lung]
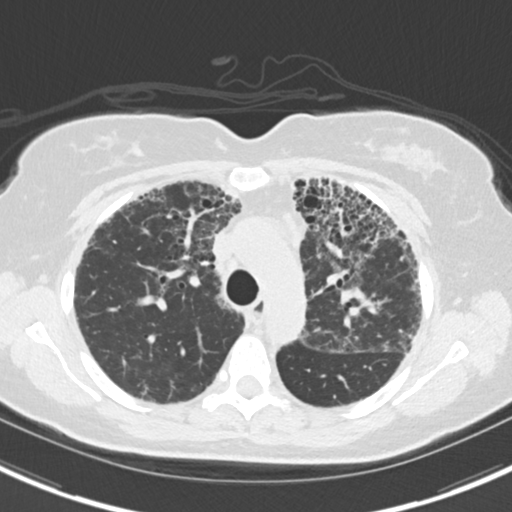
[im 97/138  mediastinal]
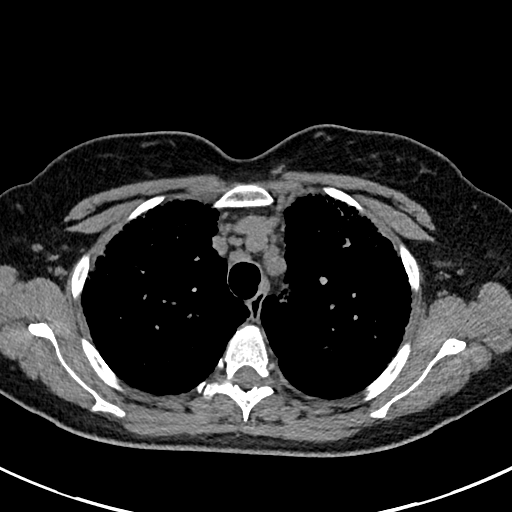
[im 97/138  lung]
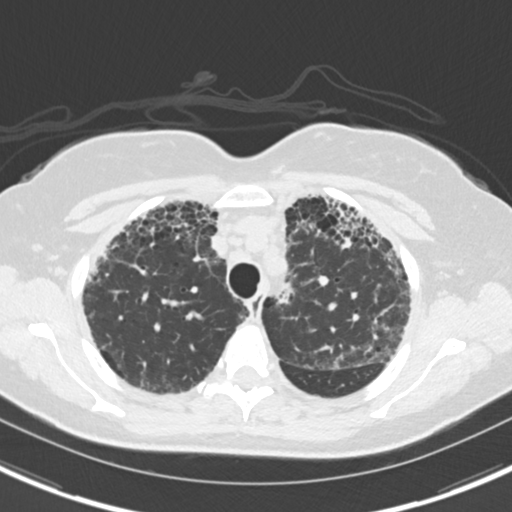
[im 107/138  lung]
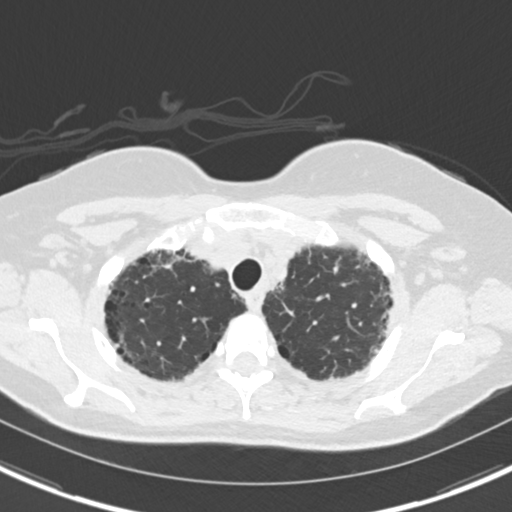
[im 117/138  lung]
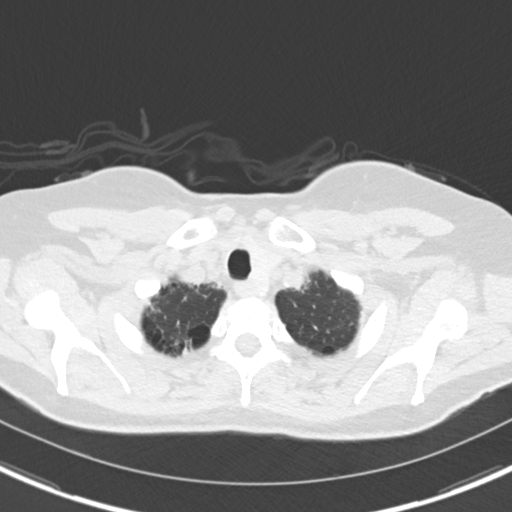
[im 127/138  lung]
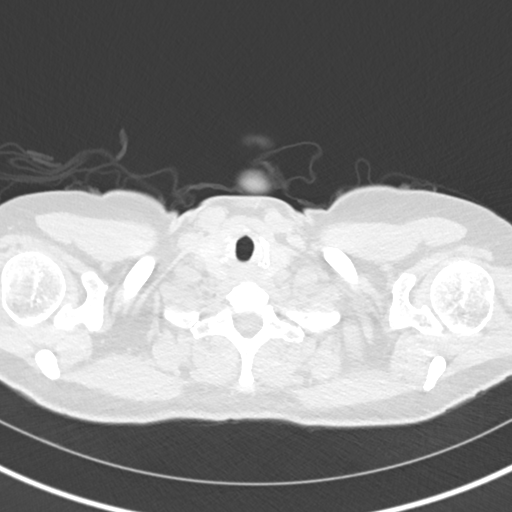

[Series 9: coronal · coronal · 0.58mm/px · 3 of 76 slices shown]
[im 16/76  lung]
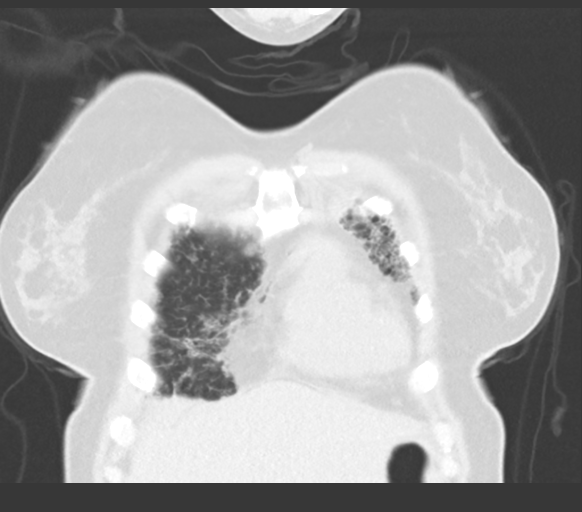
[im 31/76  lung]
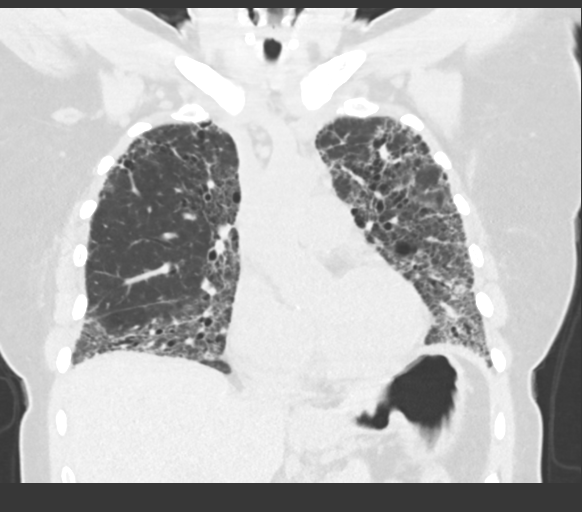
[im 46/76  lung]
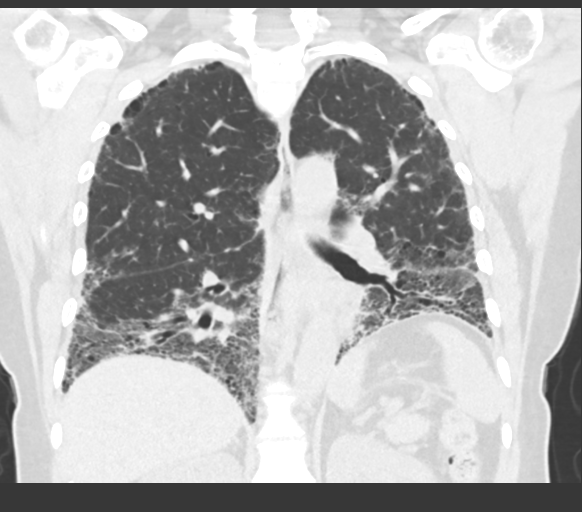

[15 of 36 positions shown; findings below may reference images not displayed]

FINDINGS: Cardiovascular: Pulmonary arteries are enlarged. Heart is at the
upper limits of normal in size to mildly enlarged. No pericardial
effusion.

Mediastinum/Nodes: No pathologically enlarged mediastinal lymph
nodes. Hilar regions are difficult to evaluate without IV contrast.
Axillary lymph nodes are not enlarged by CT size criteria. Esophagus
is grossly unremarkable.

Lungs/Pleura: There is a basilar predominant pattern of subpleural
reticulation, ground-glass, traction bronchiectasis/bronchiolectasis
and architectural distortion, similar to the prior examinations. The
findings are superimposed on paraseptal emphysema. No pleural fluid.
Airway is unremarkable.

Upper Abdomen: Visualized portions of the liver, adrenal glands,
kidneys, spleen, pancreas, stomach and bowel are grossly
unremarkable. Upper abdominal lymph nodes are not enlarged by CT
size criteria.

Musculoskeletal: No worrisome lytic or sclerotic lesions.
IMPRESSION: 1. Pulmonary parenchymal pattern of fibrosis is similar to prior
exams and in keeping with fibrotic nonspecific interstitial
pneumonitis.
2. Enlarged pulmonary arteries, indicative of pulmonary arterial
hypertension.

## 2017-07-20 ENCOUNTER — Telehealth: Payer: Self-pay | Admitting: Rheumatology

## 2017-07-20 MED ORDER — HYDROXYCHLOROQUINE SULFATE 200 MG PO TABS: 200.0000 mg | ORAL_TABLET | Freq: Two times a day (BID) | ORAL | 2 refills | Status: DC

## 2017-07-20 NOTE — Telephone Encounter (Signed)
Patient request a refill on Plaquenil sent to [facility] in [place]. Patient took last 2 tabs this am. Please call if concerns.

## 2017-07-20 NOTE — Telephone Encounter (Signed)
Last Visit: 06/10/17 Next Visit: 11/09/17 Labs: 05/04/17 cbc/cmp wnl PLQ Eye exam: 06/10/17 WNL  Okay to refill per Dr. Estanislado Pandy

## 2017-07-21 DIAGNOSIS — Z79899 Other long term (current) drug therapy: Secondary | ICD-10-CM | POA: Diagnosis not present

## 2017-07-21 DIAGNOSIS — Z1339 Encounter for screening examination for other mental health and behavioral disorders: Secondary | ICD-10-CM | POA: Diagnosis not present

## 2017-07-21 DIAGNOSIS — Z1211 Encounter for screening for malignant neoplasm of colon: Secondary | ICD-10-CM | POA: Diagnosis not present

## 2017-07-21 DIAGNOSIS — Z1331 Encounter for screening for depression: Secondary | ICD-10-CM | POA: Diagnosis not present

## 2017-07-21 DIAGNOSIS — E785 Hyperlipidemia, unspecified: Secondary | ICD-10-CM | POA: Diagnosis not present

## 2017-07-21 DIAGNOSIS — K219 Gastro-esophageal reflux disease without esophagitis: Secondary | ICD-10-CM | POA: Diagnosis not present

## 2017-07-21 DIAGNOSIS — Z6827 Body mass index (BMI) 27.0-27.9, adult: Secondary | ICD-10-CM | POA: Diagnosis not present

## 2017-07-21 DIAGNOSIS — Z0000 Encounter for general adult medical examination without abnormal findings: Secondary | ICD-10-CM | POA: Diagnosis not present

## 2017-07-21 DIAGNOSIS — E559 Vitamin D deficiency, unspecified: Secondary | ICD-10-CM | POA: Diagnosis not present

## 2017-07-21 DIAGNOSIS — Z299 Encounter for prophylactic measures, unspecified: Secondary | ICD-10-CM | POA: Diagnosis not present

## 2017-07-21 DIAGNOSIS — Z7189 Other specified counseling: Secondary | ICD-10-CM | POA: Diagnosis not present

## 2017-07-21 DIAGNOSIS — J069 Acute upper respiratory infection, unspecified: Secondary | ICD-10-CM | POA: Diagnosis not present

## 2017-09-28 ENCOUNTER — Ambulatory Visit (INDEPENDENT_AMBULATORY_CARE_PROVIDER_SITE_OTHER): Payer: Medicare Other | Admitting: *Deleted

## 2017-09-28 DIAGNOSIS — J849 Interstitial pulmonary disease, unspecified: Secondary | ICD-10-CM

## 2017-09-28 NOTE — Progress Notes (Signed)
Patient walked 381 meters during the 6 minute walk. She did not use any oxygen but her SPO2 dropped to 84 at the end of the test. Within one minute she recovered up to 96% on room air with pursed lip breathing.

## 2017-10-08 ENCOUNTER — Encounter: Payer: Self-pay | Admitting: Pulmonary Disease

## 2017-10-08 ENCOUNTER — Ambulatory Visit (INDEPENDENT_AMBULATORY_CARE_PROVIDER_SITE_OTHER): Payer: Medicare Other | Admitting: Pulmonary Disease

## 2017-10-08 VITALS — BP 134/74 | HR 100 | Ht 62.0 in | Wt 153.0 lb

## 2017-10-08 DIAGNOSIS — J849 Interstitial pulmonary disease, unspecified: Secondary | ICD-10-CM

## 2017-10-08 DIAGNOSIS — R05 Cough: Secondary | ICD-10-CM

## 2017-10-08 DIAGNOSIS — R053 Chronic cough: Secondary | ICD-10-CM

## 2017-10-08 LAB — PULMONARY FUNCTION TEST
DL/VA % PRED: 71 %
DL/VA: 3.25 ml/min/mmHg/L
DLCO UNC: 7.79 ml/min/mmHg
DLCO unc % pred: 36 %
FEF 25-75 POST: 2.44 L/s
FEF 25-75 Pre: 1.66 L/sec
FEF2575-%Change-Post: 47 %
FEF2575-%PRED-PRE: 75 %
FEF2575-%Pred-Post: 110 %
FEV1-%Change-Post: 5 %
FEV1-%PRED-POST: 65 %
FEV1-%PRED-PRE: 61 %
FEV1-PRE: 1.29 L
FEV1-Post: 1.36 L
FEV1FVC-%Change-Post: 5 %
FEV1FVC-%Pred-Pre: 109 %
FEV6-%CHANGE-POST: 0 %
FEV6-%PRED-POST: 57 %
FEV6-%PRED-PRE: 57 %
FEV6-POST: 1.46 L
FEV6-PRE: 1.46 L
FEV6FVC-%PRED-POST: 103 %
FEV6FVC-%PRED-PRE: 103 %
FVC-%Change-Post: 0 %
FVC-%Pred-Post: 55 %
FVC-%Pred-Pre: 55 %
FVC-Post: 1.46 L
FVC-Pre: 1.46 L
POST FEV6/FVC RATIO: 100 %
PRE FEV1/FVC RATIO: 88 %
Post FEV1/FVC ratio: 93 %
Pre FEV6/FVC Ratio: 100 %
RV % pred: 57 %
RV: 1.01 L
TLC % PRED: 54 %
TLC: 2.6 L

## 2017-10-08 NOTE — Patient Instructions (Signed)
For lupus related interstitial lung disease: Today's lung function testing did not show evidence of progression We will see you back in 1 year with a 6-minute walk and a lung function test Let me know if you have any difficulty breathing or problems with respiratory infections in the next year  For allergic rhinitis: Use Neil Med rinses with distilled water at least twice per day using the instructions on the package. 1/2 hour after using the [facility] Med rinse, use Nasacort two puffs in each nostril once per day.  Remember that the Nasacort can take 1-2 weeks to work after regular use. Use generic zyrtec (cetirizine) every day.  If this doesn't help, then stop taking it and use chlorpheniramine-phenylephrine combination tablets.  Follow up in 1 year or sooner if needed

## 2017-10-08 NOTE — Progress Notes (Signed)
Subjective:    Patient ID: Kari Henson, female    DOB: 1966/04/05, 52 y.o.   MRN: [id number]  Synopsis: Referred in 2018 for evaluation of interstitial lung disease in the setting of SLE. Imaging findings suggestive of fibrotic NSIP.  HPI Chief Complaint  Patient presents with  . Follow-up    review PFT.  pt states her breathing is unchanged.     Kari Henson has been having a little more difficulty with sinus congestion and postnasal drip lately.  She restarted saline rinses recently which has been helpful.  She is also started using Nasacort spray again.  She says this is helped.  She does not have problems with shortness of breath limiting her activity unless she goes for a very long walk.  She is not noticed an increase in change in any shortness of breath related issues in the last year.  She has a cough with mucus production from time to time.  She has not had progression of shortness of breath or cough in the last year.   Past Medical History:  Diagnosis Date  . GERD (gastroesophageal reflux disease)   . Lung fibrosis ([place])    follows with a Dr. Ouida Sills, pulmonary in [place], [place]  . 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 Seasonal allergies   . SLE (systemic lupus erythematosus) ([place]) 2010   she was on MTX (d/c due to cytopenia).  She has been on Prednisone and Plaquenil        Review of Systems  Constitutional: Negative for chills, fatigue and fever.  HENT: Negative for postnasal drip, rhinorrhea and sinus pain.   Respiratory: Positive for cough. Negative for shortness of breath and wheezing.   Cardiovascular: Negative for chest pain, palpitations and leg swelling.       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 There is no emphysema.  PFT: February 2018 pulmonary function testing ratio 91%, FEV1 1.45 L 68% predicted, FVC 1.59 L 60% predicted, total lung capacity 2.80 L 58% predicted, DLCO 6.60 30% predicted April 23, 2017: Ratio 89%, FEV1 1.39 L, FVC 1.53 L 58% predicted, total lung capacity 2.54, 53% predicted, DLCO 8.95 41% predicted April 2019 FVC 1.46 L 55% predicted, DLCO 7.79 mL 36% predicted     Assessment & Plan:   ILD (interstitial lung disease) (HCC)  Chronic cough   Allergic rhinitis  Discussion: This has been a stable interval for Kari Henson.  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  She has not had progression in lung function testing to speak of over the last few years.  My impression is that she had very active pneumonitis at one point in her life but this is since been quiesced since.  At this point I think we can go to annual visits and annual lung function testing.  For lupus related interstitial lung  disease: Today's lung function testing did not show evidence of progression We will see you back in 1 year with a 6-minute walk and a lung function test Let me know if you have any difficulty breathing or problems with respiratory infections in the next year  For allergic rhinitis: Use Neil Med rinses with distilled water at least twice per day using the instructions on the package. 1/2 hour after using the [facility] Med rinse, use Nasacort two puffs in each nostril once per day.  Remember that the Nasacort can take 1-2 weeks to work after regular use. Use generic zyrtec (cetirizine) every day.  If this  doesn't help, then stop taking it and use chlorpheniramine-phenylephrine combination tablets.  Follow up in 1 year or sooner if needed   Current Outpatient Medications:  .  acetaminophen (TYLENOL) 500 MG tablet, Take 500-1,000 mg by mouth every 6 (six) hours as needed for moderate pain. , Disp: , Rfl:  .  cetirizine (ZYRTEC) 10 MG chewable tablet, Chew 10 mg by mouth daily., Disp: , Rfl:  .  hydroxychloroquine (PLAQUENIL) 200 MG tablet, Take 1 tablet (200 mg total) by mouth 2 (two) times daily., Disp: 60 tablet, Rfl: 2 .  Multiple Vitamin (MULTIVITAMIN WITH MINERALS) TABS tablet, Take 1 tablet by mouth daily., Disp: , Rfl:  .  Omega-3 Fatty Acids (FISH OIL) 1000 MG CAPS, Take 1 capsule by mouth daily., Disp: , Rfl:  .  pantoprazole (PROTONIX) 40 MG tablet, Take 40 mg by mouth every morning., Disp: , Rfl:  .  triamcinolone (NASACORT ALLERGY 24HR) 55 MCG/ACT AERO nasal inhaler, Place 2 sprays into the nose daily., Disp: , Rfl:  .  Vitamin D, Ergocalciferol, (DRISDOL) 50000 units CAPS capsule, Take 50,000 Units by mouth every 7 (seven) days., Disp: , Rfl:

## 2017-10-08 NOTE — Progress Notes (Signed)
PFT done today. 

## 2017-11-09 ENCOUNTER — Ambulatory Visit: Payer: Medicare Other | Admitting: Rheumatology

## 2017-11-23 NOTE — Progress Notes (Signed)
Office Visit Note  Patient: Kari Henson             Date of Birth: December 14, 1965           MRN: [id number]             PCP: [name], NP Referring: [name], NP Visit Date: 12/07/2017 Occupation: _0 @    Subjective:  Nose sore   History of Present Illness: Kari Henson is a 52 y.o. female with history of systemic lupus erythematosus, discoid lupus, interstitial lung disease.  She takes Plaquenil 200 mg 1-2 tablets daily.  She reports that lately she has been forgetting to take the evening dose.  She states most days of the week she has only been taking 1 tablet of PLQ.  She states over the past few weeks she has noticed a painless sore in the right nostril.  She reports she is also noticed increased mouth dryness.  She denies any recent rashes, discoid lesions, or facial rashes.  She denies any hair loss.  Denies any palpitations.  She continues see Dr. Lake Bells on yearly basis.  Her last visit was on 10/08/2017 and she reports that her interstitial lung disease is stable.  She continues to take vitamin D 50,000 units once weekly.  She has had increased joint stiffness in multiple joints but no joint pain or joint swelling at this time.     Activities of Daily Living:  Patient reports morning stiffness for 5 minutes.   Patient Reports nocturnal pain.  Difficulty dressing/grooming: Denies Difficulty climbing stairs: Denies Difficulty getting out of chair: Denies Difficulty using hands for taps, buttons, cutlery, and/or writing: Denies   Review of Systems  Constitutional: Negative for fatigue.  HENT: Positive for mouth dryness. Negative for mouth sores and nose dryness.   Eyes: Negative for pain, visual disturbance and dryness.  Respiratory: Negative for cough, hemoptysis, shortness of breath and difficulty breathing.        Due to COPD   Cardiovascular: Negative for chest pain, palpitations, hypertension and swelling in legs/feet.  Gastrointestinal: Negative for  abdominal pain, blood in stool, constipation and diarrhea.  Endocrine: Negative for increased urination.  Genitourinary: Negative for painful urination and pelvic pain.  Musculoskeletal: Positive for morning stiffness. Negative for arthralgias, joint pain, joint swelling, myalgias, muscle weakness, muscle tenderness and myalgias.  Skin: Negative for color change, pallor, rash, hair loss, nodules/bumps, skin tightness, ulcers and sensitivity to sunlight.  Allergic/Immunologic: Negative for susceptible to infections.  Neurological: Negative for dizziness, light-headedness, numbness, headaches, memory loss and weakness.  Hematological: Negative for bruising/bleeding tendency and swollen glands.  Psychiatric/Behavioral: Negative for depressed mood, confusion and sleep disturbance. The patient is not nervous/anxious.     PMFS History:  Patient Active Problem List   Diagnosis Date Noted  . 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 Vitamin D deficiency 04/30/2017  . High risk medication use 04/30/2017  . History of bone density study 04/30/2017  . Chronic cough 07/24/2016  . ILD (interstitial lung disease) ([place]) 07/24/2016  . Abdominal pain, epigastric 03/29/2014  . Encounter for screening colonoscopy 03/29/2014  . 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 SLE (systemic lupus erythematosus) ([place])   . Lung fibrosis ([place])     Past Medical History:  Diagnosis Date  . GERD (gastroesophageal reflux disease)   . Lung fibrosis ([place])    follows with a Dr. Ouida Sills, pulmonary in [place], [place]  . MGUS (monoclonal gammopathy of unknown  significance) 12/01/2011  . Seasonal allergies   . SLE (systemic lupus erythematosus) ([place]) 2010   she was on MTX (d/c due to cytopenia).  She has been on Prednisone and Plaquenil    Family History  Problem Relation Age of Onset  . Asthma Mother   . Thyroid disease Mother   . Hypertension Mother   . Cirrhosis Father   .  Alcohol abuse Father   . Diabetes Father   . COPD Father   . Diabetes Maternal Aunt   . Hypertension Sister   . Healthy Brother   . Diabetes Maternal Uncle   . Diabetes Paternal Aunt   . Hypertension Maternal Grandmother   . COPD Maternal Grandmother   . Arthritis Maternal Grandmother        rheumatoid arthritis  . Healthy Son   . Colon cancer Neg Hx    Past Surgical History:  Procedure Laterality Date  . CESAREAN SECTION    . COLONOSCOPY N/A 04/27/2014   SLF:  1. one colon polyp removed 2 The left colonis redundant 3. the examined terminal ileum apperared to be  normal 3. small internal hemorrohoids.  . ESOPHAGOGASTRODUODENOSCOPY N/A 04/27/2014   SLF: 1. No obvious source for abdominal pain identified. 2. Non-erosive gastritis (inflammation) was found in the gastric antrum; multiple biopsies were performed.  Marland Kitchen HERNIA REPAIR    . MULTIPLE TOOTH EXTRACTIONS     Social History   Social History Narrative  . Not on file     Objective: Vital Signs: BP (!) 130/101 (BP Location: Left Arm, Patient Position: Sitting, Cuff Size: Normal)   Pulse (!) 104   Resp 16   Ht _0  (1.575 m)   Wt 152 lb (68.9 kg)   LMP 07/01/2013   BMI 27.80 kg/m    Physical Exam  Constitutional: She is oriented to person, place, and time. She appears well-developed and well-nourished.  HENT:  Head: Normocephalic and atraumatic.  No parotid swelling. Nasal ulceration present.  No oral ulcerations noted.   Eyes: Conjunctivae and EOM are normal.  Neck: Normal range of motion.  Cardiovascular: Normal rate, regular rhythm, normal heart sounds and intact distal pulses.  Pulmonary/Chest: Effort normal.  Crackles auscultated   Abdominal: Soft. Bowel sounds are normal.  Lymphadenopathy:    She has no cervical adenopathy.  Neurological: She is alert and oriented to person, place, and time.  Skin: Skin is warm and dry. Capillary refill takes less than 2 seconds.  Clubbing of fingernails. No malar rash  or discoid lesions noted.  Psychiatric: She has a normal mood and affect. Her behavior is normal.  Nursing note and vitals reviewed.    Musculoskeletal Exam: C-spine, thoracic spine, lumbar spine good range of motion.  No midline spinal tenderness.  No SI joint tenderness.  Shoulder joints, elbow joints, wrist joints, MCPs, PIPs, DIPs good range of motion with no synovitis.  She is clubbing of all fingernails.  Hip joints good range of motion with no discomfort.  She has no tenderness of bilateral trochanteric bursa.  CDAI Exam: No CDAI exam completed.    Investigation: No additional findings. CBC Latest Ref Rng & Units 05/04/2017 02/14/2014  WBC 3.8 - 10.8 Thousand/uL 4.9 5.6  Hemoglobin 11.7 - 15.5 g/dL 13.5 13.0  Hematocrit 35.0 - 45.0 % 41.9 39.4  Platelets 140 - 400 Thousand/uL 286 235   CMP Latest Ref Rng & Units 05/04/2017 05/04/2017 03/14/2014  Glucose 65 - 99 mg/dL - 79 -  BUN 7 - 25 mg/dL -  9 -  Creatinine 0.50 - 1.05 mg/dL - 0.73 -  Sodium 135 - 146 mmol/L - 138 -  Potassium 3.5 - 5.3 mmol/L - 3.9 -  Chloride 98 - 110 mmol/L - 100 -  CO2 20 - 32 mmol/L - 30 -  Calcium 8.6 - 10.4 mg/dL - 10.2 -  Total Protein 6.1 - 8.1 g/dL 8.6(H) 8.4(H) -  Total Bilirubin 0.2 - 1.2 mg/dL - 0.4 0.4  Alkaline Phos U/L - - 114  AST 10 - 35 U/L - 21 29  ALT 6 - 29 U/L - 11 15     Imaging: No results found.  Speciality Comments: PLQ Eye Exam: 06/10/17 WNL @ [facility]    Procedures:  No procedures performed Allergies: Patient has no known allergies.   Assessment / Plan:     Visit Diagnoses: Other systemic lupus erythematosus with other organ involvement (HCC) -  Positive ANA, DS DNA, Ro, Smith, RNP,elevated ESR, malar rash, photosensitivity, neutropenia, hypocomplementemia: She has not had any recent lupus flares. She reports 2 weeks ago she developed a painless nose ulceration as well as increased mouth dryness.  She is also been having increased joint  stiffness in multiple joints but no joint pain or joint swelling.  No synovitis was noted on exam.  No oral ulcerations were noted.  No eye dryness.  No cervical lymphadenopathy was palpated.  No malar rash or discoid lesions noted.  She has not had any photosensitivity.  She has not had any recent hair loss.  No worsening fatigue or low grade fevers.  She is also taking Plaquenil 200 mg twice daily but has missed most evening doses.  She was given a pillbox today in the office to help with her compliance.  We stressed the importance of staying on Plaquenil 200 mg twice daily.  We will check her autoimmune labs today.  She does not need any refills of Plaquenil at this time.- Plan: CBC with Differential/Platelet, COMPLETE METABOLIC PANEL WITH GFR, Urinalysis, Routine w reflex microscopic, ANA, Anti-DNA antibody, double-stranded, C3 and C4, Sedimentation rate, VITAMIN D 25 Hydroxy (Vit-D Deficiency, Fractures)  High risk medication use - On Plaquenil 200 mg by mouth twice a day.PLQ Eye Exam: 06/10/17 WNL @ [facility].  CBC and CMP are drawn today to monitor for drug toxicity.- Plan: CBC with Differential/Platelet, COMPLETE METABOLIC PANEL WITH GFR  Discoid lupus: No discoid lesions noted.  Lung fibrosis ([place]): She has lung crackles upon auscultation.  ILD (interstitial lung disease) ([place]): She has lung crackles upon auscultation.  She sees Dr. Lake Bells on yearly basis.  Her last visit was on 10/08/2017.  Clubbing of fingers: She continues to have clubbing of all fingernails.  Vitamin D deficiency -She has been taking vitamin D 50,000 units once weekly.  We will check vitamin D level today.  Plan: VITAMIN D 25 Hydroxy (Vit-D Deficiency, Fractures)  MGUS (monoclonal gammopathy of unknown significance)    Orders: Orders Placed This Encounter  Procedures  . CBC with Differential/Platelet  . COMPLETE METABOLIC PANEL WITH GFR  . Urinalysis, Routine w reflex microscopic  . ANA    . Anti-DNA antibody, double-stranded  . C3 and C4  . Sedimentation rate  . VITAMIN D 25 Hydroxy (Vit-D Deficiency, Fractures)   No orders of the defined types were placed in this encounter.    Follow-Up Instructions: Return in about 5 months (around 05/09/2018) for Systemic lupus erythematosus, ILD.   Ofilia Neas,  PA-C  Note - This record has been created using [employer].  Chart creation errors have been sought, but may not always  have been located. Such creation errors do not reflect on  the standard of medical care.

## 2017-12-07 ENCOUNTER — Encounter: Payer: Self-pay | Admitting: Physician Assistant

## 2017-12-07 ENCOUNTER — Ambulatory Visit (INDEPENDENT_AMBULATORY_CARE_PROVIDER_SITE_OTHER): Payer: Medicare Other | Admitting: Physician Assistant

## 2017-12-07 VITALS — BP 130/101 | HR 104 | Resp 16 | Ht 62.0 in | Wt 152.0 lb

## 2017-12-07 DIAGNOSIS — E559 Vitamin D deficiency, unspecified: Secondary | ICD-10-CM | POA: Diagnosis not present

## 2017-12-07 DIAGNOSIS — L930 Discoid lupus erythematosus: Secondary | ICD-10-CM

## 2017-12-07 DIAGNOSIS — R683 Clubbing of fingers: Secondary | ICD-10-CM | POA: Diagnosis not present

## 2017-12-07 DIAGNOSIS — J849 Interstitial pulmonary disease, unspecified: Secondary | ICD-10-CM

## 2017-12-07 DIAGNOSIS — D472 Monoclonal gammopathy: Secondary | ICD-10-CM | POA: Diagnosis not present

## 2017-12-07 DIAGNOSIS — Z79899 Other long term (current) drug therapy: Secondary | ICD-10-CM

## 2017-12-07 DIAGNOSIS — J8410 Pulmonary fibrosis, unspecified: Secondary | ICD-10-CM | POA: Diagnosis not present

## 2017-12-07 DIAGNOSIS — M3219 Other organ or system involvement in systemic lupus erythematosus: Secondary | ICD-10-CM

## 2017-12-09 LAB — URINALYSIS, ROUTINE W REFLEX MICROSCOPIC
BACTERIA UA: NONE SEEN /HPF
Bilirubin Urine: NEGATIVE
Glucose, UA: NEGATIVE
HYALINE CAST: NONE SEEN /LPF
Hgb urine dipstick: NEGATIVE
Ketones, ur: NEGATIVE
Nitrite: NEGATIVE
PH: 6.5 (ref 5.0–8.0)
Protein, ur: NEGATIVE
RBC / HPF: NONE SEEN /HPF (ref 0–2)
SPECIFIC GRAVITY, URINE: 1.009 (ref 1.001–1.03)
Squamous Epithelial / LPF: NONE SEEN /HPF (ref <=5)
WBC, UA: NONE SEEN /HPF (ref 0–5)

## 2017-12-09 LAB — COMPLETE METABOLIC PANEL WITH GFR
AG Ratio: 1.2 (calc) (ref 1.0–2.5)
ALT: 10 U/L (ref 6–29)
AST: 24 U/L (ref 10–35)
Albumin: 4.2 g/dL (ref 3.6–5.1)
Alkaline phosphatase (APISO): 86 U/L (ref 33–130)
BILIRUBIN TOTAL: 0.5 mg/dL (ref 0.2–1.2)
BUN/Creatinine Ratio: 8 (calc) (ref 6–22)
BUN: 5 mg/dL — ABNORMAL LOW (ref 7–25)
CHLORIDE: 102 mmol/L (ref 98–110)
CO2: 31 mmol/L (ref 20–32)
Calcium: 9.9 mg/dL (ref 8.6–10.4)
Creat: 0.6 mg/dL (ref 0.50–1.05)
GFR, Est African American: 121 mL/min/{1.73_m2} (ref >=60)
GFR, Est Non African American: 105 mL/min/{1.73_m2} (ref >=60)
Globulin: 3.6 g/dL (calc) (ref 1.9–3.7)
Glucose, Bld: 76 mg/dL (ref 65–99)
POTASSIUM: 4 mmol/L (ref 3.5–5.3)
Sodium: 140 mmol/L (ref 135–146)
Total Protein: 7.8 g/dL (ref 6.1–8.1)

## 2017-12-09 LAB — ANTI-DNA ANTIBODY, DOUBLE-STRANDED: ds DNA Ab: <1 IU/mL

## 2017-12-09 LAB — ANTI-NUCLEAR AB-TITER (ANA TITER)

## 2017-12-09 LAB — CBC WITH DIFFERENTIAL/PLATELET
BASOS ABS: 62 {cells}/uL (ref 0–200)
Basophils Relative: 1.5 %
EOS PCT: 13 %
Eosinophils Absolute: 533 cells/uL — ABNORMAL HIGH (ref 15–500)
HEMATOCRIT: 38.2 % (ref 35.0–45.0)
Hemoglobin: 12.2 g/dL (ref 11.7–15.5)
LYMPHS ABS: 1345 {cells}/uL (ref 850–3900)
MCH: 25.5 pg — ABNORMAL LOW (ref 27.0–33.0)
MCHC: 31.9 g/dL — AB (ref 32.0–36.0)
MCV: 79.7 fL — ABNORMAL LOW (ref 80.0–100.0)
MONOS PCT: 17.6 %
MPV: 12.1 fL (ref 7.5–12.5)
Neutro Abs: 1439 cells/uL — ABNORMAL LOW (ref 1500–7800)
Neutrophils Relative %: 35.1 %
PLATELETS: 273 10*3/uL (ref 140–400)
RBC: 4.79 10*6/uL (ref 3.80–5.10)
RDW: 13 % (ref 11.0–15.0)
Total Lymphocyte: 32.8 %
WBC mixed population: 722 cells/uL (ref 200–950)
WBC: 4.1 10*3/uL (ref 3.8–10.8)

## 2017-12-09 LAB — ANA: Anti Nuclear Antibody(ANA): POSITIVE — AB

## 2017-12-09 LAB — VITAMIN D 25 HYDROXY (VIT D DEFICIENCY, FRACTURES): Vit D, 25-Hydroxy: 59 ng/mL (ref 30–100)

## 2017-12-09 LAB — C3 AND C4
C3 COMPLEMENT: 136 mg/dL (ref 83–193)
C4 COMPLEMENT: 10 mg/dL — AB (ref 15–57)

## 2017-12-09 LAB — SEDIMENTATION RATE: Sed Rate: 53 mm/h — ABNORMAL HIGH (ref 0–30)

## 2017-12-09 NOTE — Progress Notes (Signed)
Patient has been noncompliant with Plaquenil.  Her labs indicate these.  Please advise patient to take Plaquenil full dose as prescribed.

## 2017-12-10 ENCOUNTER — Telehealth: Payer: Self-pay | Admitting: Rheumatology

## 2017-12-10 NOTE — Telephone Encounter (Signed)
Patient advised of lab results and verbalized understanding.

## 2017-12-10 NOTE — Telephone Encounter (Signed)
Patient called stating she was returning your call.

## 2017-12-14 ENCOUNTER — Other Ambulatory Visit: Payer: Self-pay | Admitting: Rheumatology

## 2017-12-14 NOTE — Telephone Encounter (Signed)
Last Visit: 12/07/17 Next Visit: 05/10/18 Labs: 12/07/17 Patient has been noncompliant with Plaquenil. Her labs indicate these. Please advise patient to take Plaquenil full dose as prescribed. PLQ Eye Exam: 06/10/17 WNL  Okay to refill per Dr. Estanislado Pandy

## 2018-04-04 DIAGNOSIS — Z23 Encounter for immunization: Secondary | ICD-10-CM | POA: Diagnosis not present

## 2018-04-19 ENCOUNTER — Other Ambulatory Visit: Payer: Self-pay | Admitting: Rheumatology

## 2018-04-19 NOTE — Telephone Encounter (Signed)
Last visit: 12/07/2017 Next visit: 05/10/2018 Labs: 12/07/2017 Eye exam: 06/10/2017  Okay to refill per Dr. Estanislado Pandy.

## 2018-04-21 NOTE — Progress Notes (Signed)
REVIEWED-NO ADDITIONAL RECOMMENDATIONS. 

## 2018-04-26 DIAGNOSIS — Z1231 Encounter for screening mammogram for malignant neoplasm of breast: Secondary | ICD-10-CM | POA: Diagnosis not present

## 2018-04-26 NOTE — Progress Notes (Signed)
Office Visit Note  Patient: Kari Henson             Date of Birth: 10-Apr-1966           MRN: [id number]             PCP: [name], NP Referring: [name], NP Visit Date: 05/10/2018 Occupation: _0 @  Subjective:  Medication monitoring    History of Present Illness: Kari Henson is a 52 y.o. female with history of systemic lupus erythematosus, discoid lupus, interstitial lung disease.  She is on PLQ 200 mg by mouth BID.  She denies any recent lupus flares.  She is been tolerating Plaquenil and has not missed any doses recently.  She states that since she has been taking her medication on a regular basis her disease has been better controlled.  She denies any joint pain or joint swelling at this time.  She denies any fatigue.  She has not had any sicca symptoms recently.  She has no swollen lymph nodes but she has not had any recent rashes or sores in her mouth or nose.  She has intermittent symptoms of Raynaud's but denies any digital ulcerations.  She states that she has her Plaquenil eye exam scheduled for next month.  She reports that she continues to follow-up with Dr. Lake Bells and her next appointment will be in April 2020.  She states that overall ILD has been stable she continues to have a chronic cough though.  She reports that Dr. Lake Bells had recommended that she start exercising on a regular basis.  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  Activities of Daily Living:  Patient reports morning stiffness for 5-10 minutes.   Patient Reports nocturnal pain.  Difficulty dressing/grooming: Denies Difficulty climbing stairs: Reports Difficulty getting out of chair: Denies Difficulty using hands for taps, buttons, cutlery, and/or writing: Denies  Review of Systems  Constitutional: Negative for fatigue.  HENT: Negative for mouth sores, mouth dryness and nose dryness.   Eyes: Negative for pain, visual disturbance and dryness.    Respiratory: Negative for cough, hemoptysis, shortness of breath and difficulty breathing.   Cardiovascular: Negative for chest pain, palpitations, hypertension and swelling in legs/feet.  Gastrointestinal: Negative for blood in stool, constipation and diarrhea.  Endocrine: Negative for increased urination.  Genitourinary: Negative for difficulty urinating and painful urination.  Musculoskeletal: Positive for morning stiffness. Negative for arthralgias, joint pain, joint swelling, myalgias, muscle weakness, muscle tenderness and myalgias.  Skin: Negative for color change, pallor, rash, hair loss, nodules/bumps, skin tightness, ulcers and sensitivity to sunlight.  Allergic/Immunologic: Negative for susceptible to infections.  Neurological: Negative for dizziness, numbness, headaches and weakness.  Hematological: Negative for swollen glands.  Psychiatric/Behavioral: Positive for sleep disturbance. Negative for depressed mood. The patient is not nervous/anxious.     PMFS History:  Patient Active Problem List   Diagnosis Date Noted  . 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 Vitamin D deficiency 04/30/2017  . High risk medication use 04/30/2017  . History of bone density study 04/30/2017  . Chronic cough 07/24/2016  . ILD (interstitial lung disease) ([place]) 07/24/2016  . Abdominal pain, epigastric 03/29/2014  . Encounter for screening colonoscopy 03/29/2014  . 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 SLE (systemic lupus erythematosus) ([place])   . Lung fibrosis ([place])     Past Medical History:  Diagnosis Date  . GERD (gastroesophageal reflux disease)   .  Lung fibrosis ([place])    follows with a Dr. Ouida Sills, pulmonary in [place], [place]  . 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 Seasonal allergies   . SLE (systemic lupus erythematosus) ([place]) 2010   she was on MTX (d/c due to cytopenia).  She has been on Prednisone and  Plaquenil    Family History  Problem Relation Age of Onset  . Asthma Mother   . Thyroid disease Mother   . Hypertension Mother   . Cirrhosis Father   . Alcohol abuse Father   . Diabetes Father   . COPD Father   . Diabetes Maternal Aunt   . Hypertension Sister   . Healthy Brother   . Diabetes Maternal Uncle   . Diabetes Paternal Aunt   . Hypertension Maternal Grandmother   . COPD Maternal Grandmother   . Arthritis Maternal Grandmother        rheumatoid arthritis  . Healthy Son   . Colon cancer Neg Hx    Past Surgical History:  Procedure Laterality Date  . CESAREAN SECTION    . COLONOSCOPY N/A 04/27/2014   SLF:  1. one colon polyp removed 2 The left colonis redundant 3. the examined terminal ileum apperared to be  normal 3. small internal hemorrohoids.  . ESOPHAGOGASTRODUODENOSCOPY N/A 04/27/2014   SLF: 1. No obvious source for abdominal pain identified. 2. Non-erosive gastritis (inflammation) was found in the gastric antrum; multiple biopsies were performed.  Marland Kitchen HERNIA REPAIR    . MULTIPLE TOOTH EXTRACTIONS     Social History   Social History Narrative  . Not on file   Immunization History  Administered Date(s) Administered  . Influenza Split 03/24/2016  . Influenza-Unspecified 04/19/2017  . Pneumococcal Polysaccharide-23 07/24/2013   Objective: Vital Signs: BP 135/84 (BP Location: Right Arm, Patient Position: Sitting, Cuff Size: Normal)   Pulse 98   Ht 5' 2.5" (1.588 m)   Wt 153 lb (69.4 kg)   LMP 07/01/2013   BMI 27.54 kg/m    Physical Exam  Constitutional: She is oriented to person, place, and time. She appears well-developed and well-nourished.  HENT:  Head: Normocephalic and atraumatic.  No oral or nasal ulcerations.  No parotid swelling.   Eyes: Conjunctivae and EOM are normal.  Neck: Normal range of motion.  Cardiovascular: Normal rate, regular rhythm, normal heart sounds and intact distal pulses.  Pulmonary/Chest: Effort normal. She has rales  (Crackles at bilateral lung bases).  Abdominal: Soft. Bowel sounds are normal.  Lymphadenopathy:    She has no cervical adenopathy.  Neurological: She is alert and oriented to person, place, and time.  Skin: Skin is warm and dry. Capillary refill takes less than 2 seconds.  No discoid lesions noted. No malar rash.  No digital ulcerations or signs of gangrene noted  Psychiatric: She has a normal mood and affect. Her behavior is normal.  Nursing note and vitals reviewed.    Musculoskeletal Exam: C-spine, thoracic spine, lumbar spine good range of motion.  No midline spinal tenderness.  No SI joint tenderness.  Shoulder joints, elbow joints, wrist joints, MCPs, PIPs, DIPs good range of motion with no synovitis.  Complete fist formation bilaterally.  Hip joints, knee joints, ankle joints, MTPs, PIPs, DIPs good range of motion with no synovitis.  No warmth or effusion bilateral knee joints.  No tenderness or swelling of ankle joints.  No tenderness of trochanter bursa bilaterally.  CDAI Exam: CDAI Score: Not documented Patient Global Assessment: Not documented; Provider Global Assessment: Not documented  Swollen: Not documented; Tender: Not documented Joint Exam   Not documented   There is currently no information documented on the homunculus. Go to the Rheumatology activity and complete the homunculus joint exam.  Investigation: No additional findings.  Imaging: No results found.  Recent Labs: Lab Results  Component Value Date   WBC 4.1 12/07/2017   HGB 12.2 12/07/2017   PLT 273 12/07/2017   NA 140 12/07/2017   K 4.0 12/07/2017   CL 102 12/07/2017   CO2 31 12/07/2017   GLUCOSE 76 12/07/2017   BUN 5 (L) 12/07/2017   CREATININE 0.60 12/07/2017   BILITOT 0.5 12/07/2017   ALKPHOS 114 03/14/2014   AST 24 12/07/2017   ALT 10 12/07/2017   PROT 7.8 12/07/2017   ALBUMIN 3.7 02/14/2014   CALCIUM 9.9 12/07/2017   GFRAA 121 12/07/2017   Component     Latest Ref Rng & Units  12/07/2017  C3 Complement     83 - 193 mg/dL 136  C4 Complement     15 - 57 mg/dL 10 (L)  ANA Pattern 1      SPECKLED (A)  ANA Titer 1     titer > OR = 1:1280 (A)  Anti Nuclear Antibody(ANA)     NEGATIVE POSITIVE (A)  ds DNA Ab     IU/mL <1  Sed Rate     0 - 30 mm/h 53 (H)   Speciality Comments: PLQ Eye Exam: 06/10/17 WNL @ [facility]  Procedures:  No procedures performed Allergies: Patient has no known allergies.    Assessment / Plan:     Visit Diagnoses: Other systemic lupus erythematosus with other organ involvement (HCC) -  Positive ANA, DS DNA, Ro, Smith, RNP,elevated ESR, malar rash, photosensitivity, neutropenia, hypocomplementemia: She has not had a burning recent lupus flares.  She has been compliant taking Plaquenil 200 mg 1 tablet by mouth twice daily.  She has no concerns at this time.  She has no joint pain or joint swelling.  No synovitis was noted on exam.  She has not had any sicca symptoms and no parotid swelling was noted.  No palpable cervical lymphadenopathy.  She has intermittent symptoms of Raynaud's but no digital ulcerations were noted.  No oral or nasal ulcerations noted on exam.  No malar rash or discoid lesions noted.  She was encouraged to continue to wear sunscreen and sun protective clothing on a daily basis.   She continues to follow-up with Dr. Lake Bells for management of ILD which has been stable.  Per Dr. Anastasia Pall recommendation she has been exercising on a regular basis.  She will continue on PLQ 200 mg 1 tablet by mouth twice daily.  She does not need any refills at this time.  She has PLQ eye exam scheduled for next month.  We will check autoimmune labs today.  She was advised to notify us if she develops any new or worsening symptoms.  She will follow-up in the office in 5 months.- Plan: COMPLETE METABOLIC PANEL WITH GFR, CBC with Differential/Platelet, Urinalysis, Routine w reflex microscopic, Anti-DNA antibody, double-stranded,  C3 and C4, Sedimentation rate  High risk medication use - On Plaquenil 200 mg by mouth twice a day.PLQ Eye Exam: 06/10/17 WNL @ [facility].  CBC and CMP were drawn today to monitor for drug toxicity.  She is a Plaquenil eye exam scheduled for next month. She had her yearly influenza vaccine.  - Plan: COMPLETE METABOLIC PANEL  WITH GFR, CBC with Differential/Platelet  Discoid lupus: She has no discoid lesions at this time.  She has not had any recent rashes.  No malar rash noted.  She was encouraged to wear sunscreen and sun protective clothing on a daily basis.   Lung fibrosis ([place]): Stable on Chest CT from 08/05/16  ILD (interstitial lung disease) ([place]): Crackles auscultated in bilateral lung bases.  She continues to follow-up with Dr. Lake Bells on a regular basis.  Her next appointment is in April 2020.  She had a chest CT performed on 08/05/2016 that revealed pulmonary parenchymal pattern or fibrosis similar to prior imaging. Enlarged pulmonary arteries indicative of pulmonary arterial hypertension noted. She continues to have a chronic cough but no new or worsening symptoms.  Dr. Lake Bells recommended that she work out on a regular basis.  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  Clubbing of fingers  Vitamin D deficiency: She takes a vitamin D supplement.   Other medical conditions are listed as follows:   MGUS (monoclonal gammopathy of unknown significance)  Other fatigue: She has no fatigue at this time.  She has been working out on a regular basis at [employer].   History of bone density study   Orders: Orders Placed This Encounter  Procedures  . COMPLETE METABOLIC PANEL WITH GFR  . CBC with Differential/Platelet  . Urinalysis, Routine w reflex microscopic  . Anti-DNA antibody, double-stranded  . C3 and C4  . Sedimentation rate   No orders of the defined types were placed in this encounter.     Follow-Up  Instructions: Return in about 5 months (around 10/09/2018) for Systemic lupus erythematosus, ILD.   Kari Neas, PA-C   I examined and evaluated the patient with Kari Sams PA.  Patient is clinically doing better since she has been taking Plaquenil regular basis.  She is still have some hypo-hyperpigmented lesions from discoid lupus.  Has been followed by pulmonary as well.  The plan of care was discussed as noted above.  Bo Merino, MD Note - This record has been created using [employer].  Chart creation errors have been sought, but may not always  have been located. Such creation errors do not reflect on  the standard of medical care.

## 2018-05-10 ENCOUNTER — Encounter: Payer: Self-pay | Admitting: Physician Assistant

## 2018-05-10 ENCOUNTER — Ambulatory Visit (INDEPENDENT_AMBULATORY_CARE_PROVIDER_SITE_OTHER): Payer: Medicare Other | Admitting: Physician Assistant

## 2018-05-10 VITALS — BP 135/84 | HR 98 | Ht 62.5 in | Wt 153.0 lb

## 2018-05-10 DIAGNOSIS — L930 Discoid lupus erythematosus: Secondary | ICD-10-CM

## 2018-05-10 DIAGNOSIS — Z9289 Personal history of other medical treatment: Secondary | ICD-10-CM

## 2018-05-10 DIAGNOSIS — Z79899 Other long term (current) drug therapy: Secondary | ICD-10-CM | POA: Diagnosis not present

## 2018-05-10 DIAGNOSIS — E559 Vitamin D deficiency, unspecified: Secondary | ICD-10-CM

## 2018-05-10 DIAGNOSIS — D472 Monoclonal gammopathy: Secondary | ICD-10-CM

## 2018-05-10 DIAGNOSIS — J849 Interstitial pulmonary disease, unspecified: Secondary | ICD-10-CM | POA: Diagnosis not present

## 2018-05-10 DIAGNOSIS — R683 Clubbing of fingers: Secondary | ICD-10-CM

## 2018-05-10 DIAGNOSIS — J8410 Pulmonary fibrosis, unspecified: Secondary | ICD-10-CM

## 2018-05-10 DIAGNOSIS — R5383 Other fatigue: Secondary | ICD-10-CM

## 2018-05-10 DIAGNOSIS — M3219 Other organ or system involvement in systemic lupus erythematosus: Secondary | ICD-10-CM

## 2018-05-11 LAB — COMPLETE METABOLIC PANEL WITH GFR
AG Ratio: 1.1 (calc) (ref 1.0–2.5)
ALBUMIN MSPROF: 4 g/dL (ref 3.6–5.1)
ALKALINE PHOSPHATASE (APISO): 89 U/L (ref 33–130)
ALT: 10 U/L (ref 6–29)
AST: 16 U/L (ref 10–35)
BUN: 7 mg/dL (ref 7–25)
CO2: 29 mmol/L (ref 20–32)
CREATININE: 0.68 mg/dL (ref 0.50–1.05)
Calcium: 10.1 mg/dL (ref 8.6–10.4)
Chloride: 104 mmol/L (ref 98–110)
GFR, Est African American: 116 mL/min/{1.73_m2} (ref >=60)
GFR, Est Non African American: 100 mL/min/{1.73_m2} (ref >=60)
GLOBULIN: 3.7 g/dL (ref 1.9–3.7)
Glucose, Bld: 77 mg/dL (ref 65–99)
Potassium: 3.6 mmol/L (ref 3.5–5.3)
SODIUM: 143 mmol/L (ref 135–146)
Total Bilirubin: 0.3 mg/dL (ref 0.2–1.2)
Total Protein: 7.7 g/dL (ref 6.1–8.1)

## 2018-05-11 LAB — CBC WITH DIFFERENTIAL/PLATELET
BASOS PCT: 0.9 %
Basophils Absolute: 41 cells/uL (ref 0–200)
Eosinophils Absolute: 495 cells/uL (ref 15–500)
Eosinophils Relative: 11 %
HCT: 37.5 % (ref 35.0–45.0)
Hemoglobin: 12 g/dL (ref 11.7–15.5)
Lymphs Abs: 1557 cells/uL (ref 850–3900)
MCH: 25.8 pg — ABNORMAL LOW (ref 27.0–33.0)
MCHC: 32 g/dL (ref 32.0–36.0)
MCV: 80.6 fL (ref 80.0–100.0)
MPV: 12 fL (ref 7.5–12.5)
Monocytes Relative: 15.1 %
NEUTROS PCT: 38.4 %
Neutro Abs: 1728 cells/uL (ref 1500–7800)
Platelets: 262 10*3/uL (ref 140–400)
RBC: 4.65 10*6/uL (ref 3.80–5.10)
RDW: 12.8 % (ref 11.0–15.0)
TOTAL LYMPHOCYTE: 34.6 %
WBC mixed population: 680 cells/uL (ref 200–950)
WBC: 4.5 10*3/uL (ref 3.8–10.8)

## 2018-05-11 LAB — URINALYSIS, ROUTINE W REFLEX MICROSCOPIC
Bilirubin Urine: NEGATIVE
Glucose, UA: NEGATIVE
Hgb urine dipstick: NEGATIVE
KETONES UR: NEGATIVE
Leukocytes, UA: NEGATIVE
Nitrite: NEGATIVE
PH: 6.5 (ref 5.0–8.0)
Protein, ur: NEGATIVE
SPECIFIC GRAVITY, URINE: 1.014 (ref 1.001–1.03)

## 2018-05-11 LAB — ANTI-DNA ANTIBODY, DOUBLE-STRANDED: ds DNA Ab: <1 IU/mL

## 2018-05-11 LAB — C3 AND C4
C3 Complement: 134 mg/dL (ref 83–193)
C4 Complement: <3 mg/dL — ABNORMAL LOW (ref 15–57)

## 2018-05-11 LAB — SEDIMENTATION RATE: SED RATE: 51 mm/h — AB (ref 0–30)

## 2018-05-30 NOTE — Progress Notes (Deleted)
Office Visit Note  Patient: Kari Henson             Date of Birth: [birth year]           MRN: [id number]             PCP: [name], NP Referring: [name], NP Visit Date: 06/13/2018 Occupation: _0 @  Subjective:  No chief complaint on file.  Plaquenil 200 mg twice daily.  Last Plaquenil eye exam normal on 06/10/2017. Most recent CBC/CMP within normal limits on 05/10/2018.  Recommend annual flu, Prevnar 13, and Shingrix vaccine as indicated.  Last autoimmune labs showed active disease on 05/10/2018.  She is here to discuss Imuran.  Order TPMT for today.  History of Present Illness: Kari Henson is a 52 y.o. female with history of systemic lupus erythematosus, discoid lupus, interstitial lung disease.   Activities of Daily Living:  Patient reports morning stiffness for *** {minute/hour:19697}.   Patient {ACTIONS;DENIES/REPORTS:21021675::"Denies"} nocturnal pain.  Difficulty dressing/grooming: {ACTIONS;DENIES/REPORTS:21021675::"Denies"} Difficulty climbing stairs: {ACTIONS;DENIES/REPORTS:21021675::"Denies"} Difficulty getting out of chair: {ACTIONS;DENIES/REPORTS:21021675::"Denies"} Difficulty using hands for taps, buttons, cutlery, and/or writing: {ACTIONS;DENIES/REPORTS:21021675::"Denies"}  No Rheumatology ROS completed.   PMFS History:  Patient Active Problem List   Diagnosis Date Noted  . 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 Vitamin D deficiency 04/30/2017  . High risk medication use 04/30/2017  . History of bone density study 04/30/2017  . Chronic cough 07/24/2016  . ILD (interstitial lung disease) ([place]) 07/24/2016  . Abdominal pain, epigastric 03/29/2014  . Encounter for screening colonoscopy 03/29/2014  . 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 SLE (systemic lupus erythematosus) ([place])   . Lung fibrosis ([place])     Past Medical History:  Diagnosis Date  . GERD (gastroesophageal reflux disease)     . Lung fibrosis ([place])    follows with a Dr. Ouida Sills, pulmonary in [place], [place]  . 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 Seasonal allergies   . SLE (systemic lupus erythematosus) ([place]) 2010   she was on MTX (d/c due to cytopenia).  She has been on Prednisone and Plaquenil    Family History  Problem Relation Age of Onset  . Asthma Mother   . Thyroid disease Mother   . Hypertension Mother   . Cirrhosis Father   . Alcohol abuse Father   . Diabetes Father   . COPD Father   . Diabetes Maternal Aunt   . Hypertension Sister   . Healthy Brother   . Diabetes Maternal Uncle   . Diabetes Paternal Aunt   . Hypertension Maternal Grandmother   . COPD Maternal Grandmother   . Arthritis Maternal Grandmother        rheumatoid arthritis  . Healthy Son   . Colon cancer Neg Hx    Past Surgical History:  Procedure Laterality Date  . CESAREAN SECTION    . COLONOSCOPY N/A 04/27/2014   SLF:  1. one colon polyp removed 2 The left colonis redundant 3. the examined terminal ileum apperared to be  normal 3. small internal hemorrohoids.  . ESOPHAGOGASTRODUODENOSCOPY N/A 04/27/2014   SLF: 1. No obvious source for abdominal pain identified. 2. Non-erosive gastritis (inflammation) was found in the gastric antrum; multiple biopsies were performed.  Marland Kitchen HERNIA REPAIR    . MULTIPLE TOOTH EXTRACTIONS     Social History   Social History Narrative  . Not on file    Immunization History  Administered Date(s) Administered  . Influenza Split 03/24/2016  .  Influenza-Unspecified 04/19/2017  . Pneumococcal Polysaccharide-23 07/24/2013    Objective: Vital Signs: LMP 07/01/2013    Physical Exam   Musculoskeletal Exam: ***  CDAI Exam: CDAI Score: Not documented Patient Global Assessment: Not documented; Provider Global Assessment: Not documented Swollen: Not documented; Tender: Not documented Joint Exam   Not documented   There is currently no information documented on the  homunculus. Go to the Rheumatology activity and complete the homunculus joint exam.  Investigation: No additional findings.  Imaging: No results found.  Recent Labs: Lab Results  Component Value Date   WBC 4.5 05/10/2018   HGB 12.0 05/10/2018   PLT 262 05/10/2018   NA 143 05/10/2018   K 3.6 05/10/2018   CL 104 05/10/2018   CO2 29 05/10/2018   GLUCOSE 77 05/10/2018   BUN 7 05/10/2018   CREATININE 0.68 05/10/2018   BILITOT 0.3 05/10/2018   ALKPHOS 114 03/14/2014   AST 16 05/10/2018   ALT 10 05/10/2018   PROT 7.7 05/10/2018   ALBUMIN 3.7 02/14/2014   CALCIUM 10.1 05/10/2018   GFRAA 116 05/10/2018    Speciality Comments: PLQ Eye Exam: 06/10/17 WNL @ [facility]  Procedures:  No procedures performed Allergies: Patient has no known allergies.   Assessment / Plan:     Visit Diagnoses: Other systemic lupus erythematosus with other organ involvement (HCC) -  Positive ANA, DS DNA, Ro, Smith, RNP,elevated ESR, malar rash, photosensitivity, neutropenia, hypocomplementemia:   High risk medication use - On Plaquenil 200 mg by mouth twice a day.PLQ Eye Exam: 06/10/17 WNL @ [facility]  Discoid lupus  Lung fibrosis ([place]) - Stable on Chest CT from 08/05/16  ILD (interstitial lung disease) ([place]) - Dr. [place] Bells on a regular basis.  Her next appointment is in April 2020.  She had a chest CT performed on 08/05/2016 that revealed pulmonary parenchymal pattern  Clubbing of fingers  Vitamin D deficiency  MGUS (monoclonal gammopathy of unknown significance)  Other fatigue  Former smoker   Orders: No orders of the defined types were placed in this encounter.  No orders of the defined types were placed in this encounter.   Face-to-face time spent with patient was *** minutes. Greater than 50% of time was spent in counseling and coordination of care.  Follow-Up Instructions: No follow-ups on file.   Ofilia Neas, PA-C  Note -  This record has been created using Dragon software.  Chart creation errors have been sought, but may not always  have been located. Such creation errors do not reflect on  the standard of medical care.

## 2018-06-13 ENCOUNTER — Encounter (INDEPENDENT_AMBULATORY_CARE_PROVIDER_SITE_OTHER): Payer: Medicare Other | Admitting: Ophthalmology

## 2018-06-13 ENCOUNTER — Ambulatory Visit: Payer: Medicare Other | Admitting: Physician Assistant

## 2018-06-15 NOTE — Progress Notes (Signed)
Office Visit Note  Patient: Kari Henson             Date of Birth: 09/30/1965           MRN: [id number]             PCP: [name], NP Referring: [name], NP Visit Date: 06/23/2018 Occupation: _0 @  Subjective:  Medication management.   History of Present Illness: Kari Henson is a 52 y.o. female systemic lupus erythematosus, discoid lupus and interstitial lung disease.  She states she has not noticed any change in her symptoms.  She currently have sinus allergies which causes some difficulty breathing.  She denies any increased shortness of breath.  There is no increased joint swelling.  No increased rash.  Activities of Daily Living:  Patient reports morning stiffness for 0 minutes.   Patient Denies nocturnal pain.  Difficulty dressing/grooming: Denies Difficulty climbing stairs: Reports Difficulty getting out of chair: Denies Difficulty using hands for taps, buttons, cutlery, and/or writing: Denies  Review of Systems  Constitutional: Negative for fatigue.  HENT: Negative for mouth sores, trouble swallowing, trouble swallowing and mouth dryness.   Eyes: Negative for pain, redness, itching and dryness.  Respiratory: Negative for shortness of breath, wheezing and difficulty breathing.   Cardiovascular: Negative for chest pain, palpitations and swelling in legs/feet.  Gastrointestinal: Negative for abdominal pain, blood in stool, constipation, diarrhea, nausea and vomiting.  Endocrine: Negative for increased urination.  Genitourinary: Negative for painful urination, nocturia and pelvic pain.  Musculoskeletal: Negative for arthralgias, joint pain, joint swelling and morning stiffness.  Skin: Negative for rash and hair loss.  Allergic/Immunologic: Negative for susceptible to infections.  Neurological: Positive for headaches. Negative for dizziness, memory loss and weakness.  Hematological: Negative for bruising/bleeding tendency.  Psychiatric/Behavioral:  Negative for confusion. The patient is not nervous/anxious.     PMFS History:  Patient Active Problem List   Diagnosis Date Noted  . 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 Vitamin D deficiency 04/30/2017  . High risk medication use 04/30/2017  . History of bone density study 04/30/2017  . Chronic cough 07/24/2016  . ILD (interstitial lung disease) ([place]) 07/24/2016  . Abdominal pain, epigastric 03/29/2014  . Encounter for screening colonoscopy 03/29/2014  . 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 SLE (systemic lupus erythematosus) ([place])   . Lung fibrosis ([place])     Past Medical History:  Diagnosis Date  . GERD (gastroesophageal reflux disease)   . Lung fibrosis ([place])    follows with a Dr. Ouida Sills, pulmonary in [place], [place]  . 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 Seasonal allergies   . SLE (systemic lupus erythematosus) ([place]) 2010   she was on MTX (d/c due to cytopenia).  She has been on Prednisone and Plaquenil    Family History  Problem Relation Age of Onset  . Asthma Mother   . Thyroid disease Mother   . Hypertension Mother   . Cirrhosis Father   . Alcohol abuse Father   . Diabetes Father   . COPD Father   . Diabetes Maternal Aunt   . Hypertension Sister   . Healthy Brother   . Diabetes Maternal Uncle   . Diabetes Paternal Aunt   . Hypertension Maternal Grandmother   . COPD Maternal Grandmother   . Arthritis Maternal Grandmother        rheumatoid arthritis  . Healthy Son   . Colon cancer Neg Hx  Past Surgical History:  Procedure Laterality Date  . CESAREAN SECTION    . COLONOSCOPY N/A 04/27/2014   SLF:  1. one colon polyp removed 2 The left colonis redundant 3. the examined terminal ileum apperared to be  normal 3. small internal hemorrohoids.  . ESOPHAGOGASTRODUODENOSCOPY N/A 04/27/2014   SLF: 1. No obvious source for abdominal pain identified. 2. Non-erosive gastritis (inflammation)  was found in the gastric antrum; multiple biopsies were performed.  Marland Kitchen HERNIA REPAIR    . MULTIPLE TOOTH EXTRACTIONS     Social History   Social History Narrative  . Not on file    Objective: Vital Signs: BP 121/89 (BP Location: Right Arm, Patient Position: Sitting, Cuff Size: Normal)   Pulse (!) 115   Resp 13   Ht 5' 2" (1.575 m)   Wt 156 lb (70.8 kg)   LMP 07/01/2013   BMI 28.53 kg/m    Physical Exam Vitals signs and nursing note reviewed.  Constitutional:      Appearance: She is well-developed.  HENT:     Head: Normocephalic and atraumatic.  Eyes:     Conjunctiva/sclera: Conjunctivae normal.  Neck:     Musculoskeletal: Normal range of motion.  Cardiovascular:     Rate and Rhythm: Normal rate and regular rhythm.     Heart sounds: Normal heart sounds.  Pulmonary:     Effort: Pulmonary effort is normal.     Breath sounds: Rales present.     Comments: In lung bases Abdominal:     General: Bowel sounds are normal.     Palpations: Abdomen is soft.  Lymphadenopathy:     Cervical: No cervical adenopathy.  Skin:    General: Skin is warm and dry.     Capillary Refill: Capillary refill takes less than 2 seconds.     Comments: Hold hyperpigmented  rash on the lips and right hand.  Neurological:     Mental Status: She is alert and oriented to person, place, and time.  Psychiatric:        Behavior: Behavior normal.      Musculoskeletal Exam: C-spine thoracic lumbar spine good range of motion.  Shoulder joints elbow joints wrist joint MCPs PIPs DIPs been good range of motion with no synovitis.  Hip joints knee joints ankles MTPs PIPs been good range of motion with no synovitis.  CDAI Exam: CDAI Score: Not documented Patient Global Assessment: Not documented; Provider Global Assessment: Not documented Swollen: Not documented; Tender: Not documented Joint Exam   Not documented   There is currently no information documented on the homunculus. Go to the Rheumatology  activity and complete the homunculus joint exam.  Investigation: No additional findings.  Imaging: No results found.  Recent Labs: Lab Results  Component Value Date   WBC 4.5 05/10/2018   HGB 12.0 05/10/2018   PLT 262 05/10/2018   NA 143 05/10/2018   K 3.6 05/10/2018   CL 104 05/10/2018   CO2 29 05/10/2018   GLUCOSE 77 05/10/2018   BUN 7 05/10/2018   CREATININE 0.68 05/10/2018   BILITOT 0.3 05/10/2018   ALKPHOS 114 03/14/2014   AST 16 05/10/2018   ALT 10 05/10/2018   PROT 7.7 05/10/2018   ALBUMIN 3.7 02/14/2014   CALCIUM 10.1 05/10/2018   GFRAA 116 05/10/2018    Speciality Comments: PLQ Eye Exam: 06/10/17 WNL @ [facility]  Procedures:  No procedures performed Allergies: Patient has no known allergies.   Assessment / Plan:  Visit Diagnoses: Other systemic lupus erythematosus with other organ involvement (HCC) - Positive ANA, DS DNA, Ro, Smith, RNP,elevated ESR, malar rash, photosensitivity, neutropenia, hypocomplementemia.  Her recent labs show decrease in her complement levels.  Although she is not very symptomatic.  Her double-stranded DNA has been stable.  Her sed rate stays high.  We had detailed discussion regarding adding Imuran to her regimen.  Indications side effects contraindications were discussed at length.  She was in agreement.  I will obtain following labs today.  If labs are normal I will place her on Imuran 50 mg p.o. daily.  I will just keep her on low-dose Imuran unless needed higher dose.  Imuran should help her interstitial lung disease as well.  My plan is to check labs in 2 weeks and then every 2 months to monitor for drug toxicity.  High risk medication use - PLQ 200 mg p.o. twice dailyeye exam: 06/10/2017. - Plan: Thiopurine methyltransferase(tpmt)rbc, QuantiFERON-TB Gold Plus, HIV Antibody (routine testing w rflx), Hepatitis B core antibody, IgM, Hepatitis B surface antigen, Hepatitis C antibody  Discoid lupus-patient has  old hyperpigmented lesions but no new lesions.  Lung fibrosis ([place]) - Stable on Chest CT from 08/05/16  ILD (interstitial lung disease) ([place]) -patient is followed by Dr. Lake Bells.  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  He also believes that the allergic rhinitis contributes to her symptoms.  Clubbing of fingers  Vitamin D deficiency-she is on supplement.  Other fatigue -she continues to have some fatigue but denies any increased fatigue.  MGUS (monoclonal gammopathy of unknown significance)  History of bone density study   Orders: Orders Placed This Encounter  Procedures  . Thiopurine methyltransferase(tpmt)rbc  . QuantiFERON-TB Gold Plus  . HIV Antibody (routine testing w rflx)  . Hepatitis B core antibody, IgM  . Hepatitis B surface antigen  . Hepatitis C antibody  . CMP14+EGFR  . CBC with Differential/Platelet   No orders of the defined types were placed in this encounter.   Face-to-face time spent with patient was 30 minutes. Greater than 50% of time was spent in counseling and coordination of care.  Follow-Up Instructions: Return in about 3 months (around 09/22/2018) for Systemic lupus.   Bo Merino, MD  Note - This record has been created using Editor, commissioning.  Chart creation errors have been sought, but may not always  have been located. Such creation errors do not reflect on  the standard of medical care.

## 2018-06-23 ENCOUNTER — Ambulatory Visit (INDEPENDENT_AMBULATORY_CARE_PROVIDER_SITE_OTHER): Payer: Medicare Other | Admitting: Rheumatology

## 2018-06-23 ENCOUNTER — Encounter: Payer: Self-pay | Admitting: Rheumatology

## 2018-06-23 ENCOUNTER — Telehealth: Payer: Self-pay | Admitting: Pharmacist

## 2018-06-23 VITALS — BP 121/89 | HR 115 | Resp 13 | Ht 62.0 in | Wt 156.0 lb

## 2018-06-23 DIAGNOSIS — R5383 Other fatigue: Secondary | ICD-10-CM

## 2018-06-23 DIAGNOSIS — D472 Monoclonal gammopathy: Secondary | ICD-10-CM

## 2018-06-23 DIAGNOSIS — J8410 Pulmonary fibrosis, unspecified: Secondary | ICD-10-CM

## 2018-06-23 DIAGNOSIS — J849 Interstitial pulmonary disease, unspecified: Secondary | ICD-10-CM

## 2018-06-23 DIAGNOSIS — M3219 Other organ or system involvement in systemic lupus erythematosus: Secondary | ICD-10-CM | POA: Diagnosis not present

## 2018-06-23 DIAGNOSIS — R683 Clubbing of fingers: Secondary | ICD-10-CM

## 2018-06-23 DIAGNOSIS — Z9289 Personal history of other medical treatment: Secondary | ICD-10-CM | POA: Diagnosis not present

## 2018-06-23 DIAGNOSIS — L930 Discoid lupus erythematosus: Secondary | ICD-10-CM

## 2018-06-23 DIAGNOSIS — E559 Vitamin D deficiency, unspecified: Secondary | ICD-10-CM | POA: Diagnosis not present

## 2018-06-23 DIAGNOSIS — Z79899 Other long term (current) drug therapy: Secondary | ICD-10-CM

## 2018-06-23 NOTE — Progress Notes (Signed)
Pharmacy Note  Subjective:  Patient presents today to the [facility] to see Dr. Estanislado Pandy.  Patient seen by the pharmacist for counseling on Imuran for lupus. She is currently on Plaquenil.  Objective:  Baseline Immunosuppressant Therapy Labs   Immunoglobulin Electrophoresis Latest Ref Rng & Units 05/04/2017  IgA  81 - 463 mg/dL 296  IgG 694 - 1,618 mg/dL 1,654(H)  IgM 48 - 271 mg/dL 728(H)    Serum Protein Electrophoresis Latest Ref Rng & Units 05/10/2018  Total Protein 6.1 - 8.1 g/dL 7.7  Albumin 3.8 - 4.8 g/dL -  Alpha-1 0.2 - 0.3 g/dL -  Alpha-2 0.5 - 0.9 g/dL -  Beta Globulin 0.4 - 0.6 g/dL -  Beta 2 0.2 - 0.5 g/dL -  Gamma Globulin 0.8 - 1.7 g/dL -   TB Gold: pending 06/23/18 HIV: pending 06/23/18 Hepatitis: negative 11/18/10  TMPT:pending 06/23/18  Chest Xray: 06/26/14  Patient was counseled on the purpose, proper use, and adverse effects of azathioprine including risk of infection, nausea, rash, and hair loss.  Reviewed risk of cancer after long term use.  Discussed risk of myelosupression and reviewed importance of frequent lab work to monitor blood counts.  She prefers to obtain labs at [facility] closer to home.  Standing orders placed and instructed patient to call 24 hours in advance to release orders.  Reviewed drug-drug interactions including contraindication with allopurinol.    Provided patient with educational materials on azathioprine and answered all questions.  Patient consented to azathioprine.  Will upload consent into the media tab.   Patient dose will be 50 mg daily.  Prescription will be sent to pharmacy pending lab results and/or insurance approval.  All questions encouraged and answered.  Instructed patient to call with any further questions or concerns.  Mariella Saa, PharmD, [facility] Rheumatology Clinical Pharmacist  06/23/2018 4:16 PM

## 2018-06-23 NOTE — Patient Instructions (Signed)
Standing Labs We placed an order today for your standing lab work.    Please come back and get your standing labs in 2 weeks, 4 weeks, 8 weeks, and then every 3 months.  We have open lab Monday through Friday from 8:30-11:30 AM and 1:30-4:00 PM  at the office of Dr. Bo Merino.   You may experience shorter wait times on Monday and Friday afternoons. The office is located at [address], [place], [place], [place] [address] No appointment is necessary.   Labs are drawn by [facility].  You may receive a bill from [facility] for your lab work.  If you wish to have your labs drawn at another location, please call the office 24 hours in advance to send orders.  If you have any questions regarding directions or hours of operation,  please call [phone].   Just as a reminder please drink plenty of water prior to coming for your lab work. Thanks!  Vaccines You are taking a medication(s) that can suppress your immune system.  The following immunizations are recommended: . Flu annually . Pneumonia (Pneumovax 23 and Prevnar 13 spaced at least 1 year apart) . Shingrix  Please check with your PCP to make sure you are up to date.   Azathioprine tablets What is this medicine? AZATHIOPRINE (ay za THYE oh preen) suppresses the immune system. It is used to prevent organ rejection after a transplant. It is also used to treat rheumatoid arthritis. This medicine may be used for other purposes; ask your health care provider or pharmacist if you have questions. COMMON BRAND NAME(S): Azasan, Imuran What should I tell my health care provider before I take this medicine? They need to know if you have any of these conditions: -infection -kidney disease -liver disease -an unusual or allergic reaction to azathioprine, other medicines, lactose, foods, dyes, or preservatives -pregnant or trying to get pregnant -breast feeding How should I use this medicine? Take this medicine by mouth with a full  glass of water. Follow the directions on the prescription label. Take your medicine at regular intervals. Do not take your medicine more often than directed. Continue to take your medicine even if you feel better. Do not stop taking except on your doctor's advice. Talk to your pediatrician regarding the use of this medicine in children. Special care may be needed. Overdosage: If you think you have taken too much of this medicine contact a poison control center or emergency room at once. NOTE: This medicine is only for you. Do not share this medicine with others. What if I miss a dose? If you miss a dose, take it as soon as you can. If it is almost time for your next dose, take only that dose. Do not take double or extra doses. What may interact with this medicine? Do not take this medicine with any of the following medications: -febuxostat -mercaptopurine This medicine may also interact with the following medications: -allopurinol -aminosalicylates like sulfasalazine, mesalamine, balsalazide, and olsalazine -leflunomide -medicines called ACE inhibitors like benazepril, captopril, enalapril, fosinopril, quinapril, lisinopril, ramipril, and trandolapril -mycophenolate -sulfamethoxazole; trimethoprim -vaccines -warfarin This list may not describe all possible interactions. Give your health care provider a list of all the medicines, herbs, non-prescription drugs, or dietary supplements you use. Also tell them if you smoke, drink alcohol, or use illegal drugs. Some items may interact with your medicine. What should I watch for while using this medicine? Visit your doctor or health care professional for regular checks on your progress.  You will need frequent blood checks during the first few months you are receiving the medicine. If you get a cold or other infection while receiving this medicine, call your doctor or health care professional. Do not treat yourself. The medicine may increase your risk  of getting an infection. Women should inform their doctor if they wish to become pregnant or think they might be pregnant. There is a potential for serious side effects to an unborn child. Talk to your health care professional or pharmacist for more information. Men may have a reduced sperm count while they are taking this medicine. Talk to your health care professional for more information. This medicine may increase your risk of getting certain kinds of cancer. Talk to your doctor about healthy lifestyle choices, important screenings, and your risk. What side effects may I notice from receiving this medicine? Side effects that you should report to your doctor or health care professional as soon as possible: -allergic reactions like skin rash, itching or hives, swelling of the face, lips, or tongue -changes in vision -confusion -fever, chills, or any other sign of infection -loss of balance or coordination -severe stomach pain -unusual bleeding, bruising -unusually weak or tired -vomiting -yellowing of the eyes or skin Side effects that usually do not require medical attention (report to your doctor or health care professional if they continue or are bothersome): -hair loss -nausea This list may not describe all possible side effects. Call your doctor for medical advice about side effects. You may report side effects to FDA at 1-800-FDA-1088. Where should I keep my medicine? Keep out of the reach of children. Store at room temperature between 15 and 25 degrees C (59 and 77 degrees F). Protect from light. Throw away any unused medicine after the expiration date. NOTE: This sheet is a summary. It may not cover all possible information. If you have questions about this medicine, talk to your doctor, pharmacist, or health care provider.  2019 Elsevier/Gold Standard (2013-10-10 12:00:31)

## 2018-06-23 NOTE — Telephone Encounter (Signed)
Patient dose will be 50 mg daily.  Prescription will be sent to pharmacy pending lab results and insurance approval.

## 2018-06-24 DIAGNOSIS — Z299 Encounter for prophylactic measures, unspecified: Secondary | ICD-10-CM | POA: Diagnosis not present

## 2018-06-24 DIAGNOSIS — Z87891 Personal history of nicotine dependence: Secondary | ICD-10-CM | POA: Diagnosis not present

## 2018-06-24 DIAGNOSIS — J069 Acute upper respiratory infection, unspecified: Secondary | ICD-10-CM | POA: Diagnosis not present

## 2018-06-24 DIAGNOSIS — M1990 Unspecified osteoarthritis, unspecified site: Secondary | ICD-10-CM | POA: Diagnosis not present

## 2018-06-24 DIAGNOSIS — Z6826 Body mass index (BMI) 26.0-26.9, adult: Secondary | ICD-10-CM | POA: Diagnosis not present

## 2018-06-24 DIAGNOSIS — L930 Discoid lupus erythematosus: Secondary | ICD-10-CM | POA: Diagnosis not present

## 2018-06-27 NOTE — Telephone Encounter (Signed)
Patient called to confirm that we were waiting to send in prescription pending lab results.  Informed patient that was correct and that there are still a few labs pending and our office would call her about the results.  She asked for lab results be sent to Dr. Manuella Ghazi as well.  Patient went to PCP about her respiratory symptoms and was given a z-pack.  Instructed patient to not start Imuran until after she is finished with the z-pack.  Patient verbalized understanding.  All questions encouraged and answered.  Instructed patient to call with any further questions or concerns.  Mariella Saa, PharmD, [facility] Rheumatology Clinical Pharmacist  06/27/2018 10:24 AM

## 2018-06-28 LAB — QUANTIFERON-TB GOLD PLUS
Mitogen-NIL: 4.34 IU/mL
NIL: 0.04 IU/mL
QuantiFERON-TB Gold Plus: NEGATIVE
TB1-NIL: 0.02 IU/mL
TB2-NIL: 0 [IU]/mL

## 2018-06-28 LAB — HEPATITIS B SURFACE ANTIGEN: Hepatitis B Surface Ag: NONREACTIVE

## 2018-06-28 LAB — HEPATITIS B CORE ANTIBODY, IGM: Hep B C IgM: NONREACTIVE

## 2018-06-28 LAB — THIOPURINE METHYLTRANSFERASE (TPMT), RBC: THIOPURINE METHYLTRANSFERASE, RBC: 14 nmol/h/mL

## 2018-06-28 LAB — HEPATITIS C ANTIBODY
Hepatitis C Ab: NONREACTIVE
SIGNAL TO CUT-OFF: 0.09 (ref <1.00)

## 2018-06-28 LAB — HIV ANTIBODY (ROUTINE TESTING W REFLEX): HIV: NONREACTIVE

## 2018-06-30 MED ORDER — AZATHIOPRINE 50 MG PO TABS: 50.0000 mg | ORAL_TABLET | Freq: Every day | ORAL | 0 refills | Status: DC

## 2018-06-30 NOTE — Addendum Note (Signed)
Addended by: Carole Binning on: 06/30/2018 09:22 AM   Modules accepted: Orders

## 2018-06-30 NOTE — Telephone Encounter (Signed)
Prescription sent to the pharmacy and patient advised.

## 2018-07-08 ENCOUNTER — Encounter (INDEPENDENT_AMBULATORY_CARE_PROVIDER_SITE_OTHER): Payer: Medicare Other | Admitting: Ophthalmology

## 2018-07-08 DIAGNOSIS — H43813 Vitreous degeneration, bilateral: Secondary | ICD-10-CM

## 2018-07-08 DIAGNOSIS — M069 Rheumatoid arthritis, unspecified: Secondary | ICD-10-CM

## 2018-07-19 ENCOUNTER — Other Ambulatory Visit: Payer: Self-pay | Admitting: *Deleted

## 2018-07-19 DIAGNOSIS — Z79899 Other long term (current) drug therapy: Secondary | ICD-10-CM

## 2018-07-22 DIAGNOSIS — E785 Hyperlipidemia, unspecified: Secondary | ICD-10-CM | POA: Diagnosis not present

## 2018-07-22 DIAGNOSIS — Z79899 Other long term (current) drug therapy: Secondary | ICD-10-CM | POA: Diagnosis not present

## 2018-07-22 DIAGNOSIS — Z1339 Encounter for screening examination for other mental health and behavioral disorders: Secondary | ICD-10-CM | POA: Diagnosis not present

## 2018-07-22 DIAGNOSIS — Z7189 Other specified counseling: Secondary | ICD-10-CM | POA: Diagnosis not present

## 2018-07-22 DIAGNOSIS — Z0000 Encounter for general adult medical examination without abnormal findings: Secondary | ICD-10-CM | POA: Diagnosis not present

## 2018-07-22 DIAGNOSIS — Z01419 Encounter for gynecological examination (general) (routine) without abnormal findings: Secondary | ICD-10-CM | POA: Diagnosis not present

## 2018-07-22 DIAGNOSIS — Z1211 Encounter for screening for malignant neoplasm of colon: Secondary | ICD-10-CM | POA: Diagnosis not present

## 2018-07-22 DIAGNOSIS — Z6826 Body mass index (BMI) 26.0-26.9, adult: Secondary | ICD-10-CM | POA: Diagnosis not present

## 2018-07-22 DIAGNOSIS — Z299 Encounter for prophylactic measures, unspecified: Secondary | ICD-10-CM | POA: Diagnosis not present

## 2018-07-22 DIAGNOSIS — Z1331 Encounter for screening for depression: Secondary | ICD-10-CM | POA: Diagnosis not present

## 2018-07-29 DIAGNOSIS — Z23 Encounter for immunization: Secondary | ICD-10-CM | POA: Diagnosis not present

## 2018-08-12 ENCOUNTER — Telehealth: Payer: Self-pay | Admitting: *Deleted

## 2018-08-12 MED ORDER — AZATHIOPRINE 50 MG PO TABS: 50.0000 mg | ORAL_TABLET | Freq: Every day | ORAL | 0 refills | Status: DC

## 2018-08-12 NOTE — Telephone Encounter (Signed)
Patient contacted the office stating that she is needing a refill on her Imuran. Patient states she recently started the medication in January 2020. Patient states she had her labs at her PCP and will have them faxed to our office. Will send in refill when received.

## 2018-08-12 NOTE — Addendum Note (Signed)
Addended by: Carole Binning on: 08/12/2018 02:28 PM   Modules accepted: Orders

## 2018-08-12 NOTE — Telephone Encounter (Signed)
Last Visit: 06/23/18 Next Visit: 09/22/18 Labs: 05/10/18   Okay to refill 30 day supply Imuran per Dr. Estanislado Pandy

## 2018-08-15 ENCOUNTER — Other Ambulatory Visit: Payer: Self-pay | Admitting: Rheumatology

## 2018-08-15 NOTE — Telephone Encounter (Signed)
Last Visit: 06/23/18 Next Visit: 09/22/18 Labs: 05/10/18  PLQ Eye Exam: 07/08/18 WNL  Okay to refill per Dr. Estanislado Pandy

## 2018-08-17 ENCOUNTER — Telehealth: Payer: Self-pay

## 2018-08-17 NOTE — Telephone Encounter (Signed)
Labs received on patient.  07/22/2018 MCH 26.3 prev. 25.8 MCHC 30.9 Bun/Creat 8 Calcium 10.5 prev. 10.1  Labs reviewed by Dr. Estanislado Pandy and will be sent to scan.

## 2018-09-09 ENCOUNTER — Other Ambulatory Visit: Payer: Self-pay | Admitting: Rheumatology

## 2018-09-09 NOTE — Telephone Encounter (Addendum)
Last Visit: 06/23/18 Next Visit: 09/22/18 Labs: 07/22/18 MCH 26.3, MCHC 30.9 Calcium 10.5  Okay to refill per Dr. Estanislado Pandy

## 2018-09-22 ENCOUNTER — Ambulatory Visit: Payer: Medicare Other | Admitting: Physician Assistant

## 2018-09-22 NOTE — Progress Notes (Deleted)
Office Visit Note  Patient: Kari Henson             Date of Birth: 1965/12/19           MRN: [id number]             PCP: [name], NP Referring: [name], NP Visit Date: 09/22/2018 Occupation: _0 @  Subjective:  No chief complaint on file.   History of Present Illness: Kari Henson is a 53 y.o. female ***   Activities of Daily Living:  Patient reports morning stiffness for *** {minute/hour:19697}.   Patient {ACTIONS;DENIES/REPORTS:21021675::"Denies"} nocturnal pain.  Difficulty dressing/grooming: {ACTIONS;DENIES/REPORTS:21021675::"Denies"} Difficulty climbing stairs: {ACTIONS;DENIES/REPORTS:21021675::"Denies"} Difficulty getting out of chair: {ACTIONS;DENIES/REPORTS:21021675::"Denies"} Difficulty using hands for taps, buttons, cutlery, and/or writing: {ACTIONS;DENIES/REPORTS:21021675::"Denies"}  No Rheumatology ROS completed.   PMFS History:  Patient Active Problem List   Diagnosis Date Noted  . 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 Vitamin D deficiency 04/30/2017  . High risk medication use 04/30/2017  . History of bone density study 04/30/2017  . Chronic cough 07/24/2016  . ILD (interstitial lung disease) ([place]) 07/24/2016  . Abdominal pain, epigastric 03/29/2014  . Encounter for screening colonoscopy 03/29/2014  . 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 SLE (systemic lupus erythematosus) ([place])   . Lung fibrosis ([place])     Past Medical History:  Diagnosis Date  . GERD (gastroesophageal reflux disease)   . Lung fibrosis ([place])    follows with a Dr. Ouida Sills, pulmonary in [place], [place]  . 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 Seasonal allergies   . SLE (systemic lupus erythematosus) ([place]) 2010   she was on MTX (d/c due to cytopenia).  She has been on Prednisone and Plaquenil    Family History  Problem Relation Age of Onset  . Asthma Mother   . Thyroid disease Mother    . Hypertension Mother   . Cirrhosis Father   . Alcohol abuse Father   . Diabetes Father   . COPD Father   . Diabetes Maternal Aunt   . Hypertension Sister   . Healthy Brother   . Diabetes Maternal Uncle   . Diabetes Paternal Aunt   . Hypertension Maternal Grandmother   . COPD Maternal Grandmother   . Arthritis Maternal Grandmother        rheumatoid arthritis  . Healthy Son   . Colon cancer Neg Hx    Past Surgical History:  Procedure Laterality Date  . CESAREAN SECTION    . COLONOSCOPY N/A 04/27/2014   SLF:  1. one colon polyp removed 2 The left colonis redundant 3. the examined terminal ileum apperared to be  normal 3. small internal hemorrohoids.  . ESOPHAGOGASTRODUODENOSCOPY N/A 04/27/2014   SLF: 1. No obvious source for abdominal pain identified. 2. Non-erosive gastritis (inflammation) was found in the gastric antrum; multiple biopsies were performed.  Marland Kitchen HERNIA REPAIR    . MULTIPLE TOOTH EXTRACTIONS     Social History   Social History Narrative  . Not on file   Immunization History  Administered Date(s) Administered  . Influenza Split 03/24/2016  . Influenza-Unspecified 04/19/2017  . Pneumococcal Polysaccharide-23 07/24/2013     Objective: Vital Signs: LMP 07/01/2013    Physical Exam   Musculoskeletal Exam: ***  CDAI Exam: CDAI Score: Not documented Patient Global Assessment: Not documented; Provider Global Assessment: Not documented Swollen: Not documented; Tender: Not documented Joint Exam   Not documented   There is currently no information documented  on the homunculus. Go to the Rheumatology activity and complete the homunculus joint exam.  Investigation: No additional findings.  Imaging: No results found.  Recent Labs: Lab Results  Component Value Date   WBC 4.5 05/10/2018   HGB 12.0 05/10/2018   PLT 262 05/10/2018   NA 143 05/10/2018   K 3.6 05/10/2018   CL 104 05/10/2018   CO2 29 05/10/2018   GLUCOSE 77 05/10/2018   BUN 7 05/10/2018    CREATININE 0.68 05/10/2018   BILITOT 0.3 05/10/2018   ALKPHOS 114 03/14/2014   AST 16 05/10/2018   ALT 10 05/10/2018   PROT 7.7 05/10/2018   ALBUMIN 3.7 02/14/2014   CALCIUM 10.1 05/10/2018   GFRAA 116 05/10/2018   QFTBGOLDPLUS NEGATIVE 06/23/2018    Speciality Comments: PLQ Eye Exam: 07/08/18 @ [facility]  Procedures:  No procedures performed Allergies: Patient has no known allergies.   Assessment / Plan:     Visit Diagnoses: No diagnosis found.   Orders: No orders of the defined types were placed in this encounter.  No orders of the defined types were placed in this encounter.   Face-to-face time spent with patient was *** minutes. Greater than 50% of time was spent in counseling and coordination of care.  Follow-Up Instructions: No follow-ups on file.   Earnestine Mealing, CMA  Note - This record has been created using Editor, commissioning.  Chart creation errors have been sought, but may not always  have been located. Such creation errors do not reflect on  the standard of medical care.

## 2018-09-26 ENCOUNTER — Encounter: Payer: Self-pay | Admitting: Rheumatology

## 2018-09-26 ENCOUNTER — Telehealth: Payer: Self-pay | Admitting: Rheumatology

## 2018-09-26 ENCOUNTER — Telehealth (INDEPENDENT_AMBULATORY_CARE_PROVIDER_SITE_OTHER): Payer: Medicare Other | Admitting: Rheumatology

## 2018-09-26 DIAGNOSIS — R5383 Other fatigue: Secondary | ICD-10-CM | POA: Diagnosis not present

## 2018-09-26 DIAGNOSIS — E559 Vitamin D deficiency, unspecified: Secondary | ICD-10-CM

## 2018-09-26 DIAGNOSIS — Z79899 Other long term (current) drug therapy: Secondary | ICD-10-CM | POA: Diagnosis not present

## 2018-09-26 DIAGNOSIS — J8410 Pulmonary fibrosis, unspecified: Secondary | ICD-10-CM

## 2018-09-26 DIAGNOSIS — L930 Discoid lupus erythematosus: Secondary | ICD-10-CM | POA: Diagnosis not present

## 2018-09-26 DIAGNOSIS — R683 Clubbing of fingers: Secondary | ICD-10-CM

## 2018-09-26 DIAGNOSIS — Z87891 Personal history of nicotine dependence: Secondary | ICD-10-CM | POA: Diagnosis not present

## 2018-09-26 DIAGNOSIS — D472 Monoclonal gammopathy: Secondary | ICD-10-CM | POA: Diagnosis not present

## 2018-09-26 DIAGNOSIS — M3219 Other organ or system involvement in systemic lupus erythematosus: Secondary | ICD-10-CM | POA: Diagnosis not present

## 2018-09-26 DIAGNOSIS — J849 Interstitial pulmonary disease, unspecified: Secondary | ICD-10-CM | POA: Diagnosis not present

## 2018-09-26 NOTE — Progress Notes (Signed)
Virtual Visit via Telephone Note  I connected with Kari Henson on 09/26/18 at 10:30 AM EDT by telephone and verified that I am speaking with the correct person using two identifiers.   I discussed the limitations, risks, security and privacy concerns of performing an evaluation and management service by telephone and the availability of in person appointments. I also discussed with the patient that there may be a patient responsible charge related to this service. The patient expressed understanding and agreed to proceed.  CC: Discuss medications  History of Present Illness: Patient is a 53 year old female with a past medical history of systemic lupus, discoid lupus, and ILD.  She was started on Imuran 50 mg po daily in December 2019.  She is tolerating Imuran without any side effects.  She continues to take plaquenil 200 mg 1 tablet BID.  She denies any signs or symptoms of a flare. She denies any oral or nasal sores, rashes, hair loss or photosensitivity.  She denies any joint pain or joint swelling.  She states she has some mild shortness of breath with exertion if she walks for prolonged distances.  She has no increased SOB.  She denies any palpitations. She denies any swollen lymph nodes or worsening fatigue.      Review of Systems  Constitutional: Negative for fever and malaise/fatigue.  Eyes: Negative for photophobia, pain and redness.  Respiratory: Positive for shortness of breath (SOB with exertion).   Cardiovascular: Negative for chest pain and palpitations.  Gastrointestinal: Negative for constipation, diarrhea and nausea.  Genitourinary: Negative for dysuria.  Musculoskeletal: Negative for back pain, joint pain, myalgias and neck pain.  Skin: Negative for rash.  Neurological: Negative for dizziness and headaches.  Psychiatric/Behavioral: Negative for depression. The patient is not nervous/anxious.     Observations/Objective: Physical Exam Neurological:     Mental Status:  She is oriented to person, place, and time.  Psychiatric:        Mood and Affect: Mood normal.        Behavior: Behavior normal.        Thought Content: Thought content normal.   Denies morning stiffness.  Denies climbing steps and getting up from a chair.     Assessment and Plan: Other systemic lupus erythematosus with other organ involvement: Positive ANA, DS DNA, Ro, Smith, RNP,elevated ESR, malar rash, photosensitivity, neutropenia, hypocomplementemia: She has not experienced any signs or symptoms of a lupus flare. She was started on Imuran 50 mg 1 tablet po daily in December 2019 and she has been tolerating it well.  We will obtain lab work prior to increasing her dose to 100 mg po daily.  She continues to take Plaquenil 200 mg 1 tablet BID.  She has not missed any doses recently.  She has no joint pain or joint swelling at this time.  No discoid lesions.  She has not had any recent rashes, hair loss, or photosensitivity.  No oral or nasal ulcerations.  No sicca symptoms.  No symptoms of raynaud's. Denies any swollen lymph nodes, fevers, or worsening fatigue. She will continue on Imuran 50 mg po daily and PLQ 200 mg BID.  We will increase Imuran to 100 mg po daily if her lab work is WNL.  She was advised to notify us if she develops any signs or symptoms of a lupus flare. She will follow up in 3 months.    High risk medication use: PLQ 200 mg BID. PLQ Eye Exam: 07/08/18 @ [facility]  She was started on Imuran at her last visit on 06/23/18.  If her lab work is WNL she will increase to 100 mg of Imuran daily.  Future orders were placed today.  A copy of lab orders were faxed to her PCPs office to be drawn there.   Discoid lupus: She has not had any recent discoid lesions.  Lung fibrosis: She is followed by Dr. Lake Bells.  ILD (interstital lung disease): She is followed by Dr. Lake Bells. According to his last note she was clinically stable. She sees him yearly.  She has not  had any increased shortness of breath. No new symptoms.    Clubbing of fingers: Chronic  Vitamin D Deficiency: She is taking a vitamin D supplement 50,000 units by mouth once weekly. We will recheck vitamin D with upcoming lab work.   Other fatigue: She has not experienced any worsening fatigue recently.    Follow Up Instructions: We will check autoimmune lab work. We will fax orders to her PCP office.  Follow up in 3 months.  She was advised to notify us if develops any signs or symptoms of a lupus flare.    I discussed the assessment and treatment plan with the patient. The patient was provided an opportunity to ask questions and all were answered. The patient agreed with the plan and demonstrated an understanding of the instructions.   The patient was advised to call back or seek an in-person evaluation if the symptoms worsen or if the condition fails to improve as anticipated.  I provided 25 minutes of non-face-to-face time during this encounter.  Bo Merino, MD  Scribed by- Ofilia Neas, PA-C

## 2018-09-26 NOTE — Telephone Encounter (Signed)
-----  Message from [place] sent at 09/26/2018 10:57 AM EDT ----- Patient had virtual visit today with Dr. Estanislado Pandy. Please call to schedule 3 month follow up. Thanks!

## 2018-09-26 NOTE — Telephone Encounter (Signed)
LMOM for patient to call and schedule 3 month follow-up appointment.

## 2018-10-05 DIAGNOSIS — E559 Vitamin D deficiency, unspecified: Secondary | ICD-10-CM | POA: Diagnosis not present

## 2018-10-05 DIAGNOSIS — R5383 Other fatigue: Secondary | ICD-10-CM | POA: Diagnosis not present

## 2018-10-05 DIAGNOSIS — Z79899 Other long term (current) drug therapy: Secondary | ICD-10-CM | POA: Diagnosis not present

## 2018-10-10 ENCOUNTER — Ambulatory Visit: Payer: Medicare Other | Admitting: Physician Assistant

## 2018-10-12 ENCOUNTER — Telehealth: Payer: Self-pay | Admitting: Rheumatology

## 2018-10-12 NOTE — Telephone Encounter (Signed)
Patient called stating she had labwork on 10/05/18 with Dr. Brigitte Pulse at [facility].  Patient was given Dr. Arlean Hopping fax number for them to send the results.

## 2018-10-12 NOTE — Telephone Encounter (Signed)
Thank you for informing me.

## 2018-10-20 ENCOUNTER — Other Ambulatory Visit: Payer: Self-pay | Admitting: Rheumatology

## 2018-10-20 NOTE — Telephone Encounter (Signed)
ok

## 2018-10-20 NOTE — Telephone Encounter (Signed)
Last Visit: 09/26/18 Next visit: 12/27/18 Labs: 10/06/18 MCH MCH 25.9 EOS (abs) 0.7 BUN 5 BUN/Creat. Ratio 8 C4-7 Sed Rate- 58  Okay to refill Imuran?

## 2018-10-25 DIAGNOSIS — Z713 Dietary counseling and surveillance: Secondary | ICD-10-CM | POA: Diagnosis not present

## 2018-10-25 DIAGNOSIS — Z6826 Body mass index (BMI) 26.0-26.9, adult: Secondary | ICD-10-CM | POA: Diagnosis not present

## 2018-10-25 DIAGNOSIS — J449 Chronic obstructive pulmonary disease, unspecified: Secondary | ICD-10-CM | POA: Diagnosis not present

## 2018-10-25 DIAGNOSIS — Z299 Encounter for prophylactic measures, unspecified: Secondary | ICD-10-CM | POA: Diagnosis not present

## 2018-12-15 NOTE — Progress Notes (Signed)
Office Visit Note  Patient: Kari Henson             Date of Birth: [birth year]           MRN: [id number]             PCP: [name], NP Referring: [name], NP Visit Date: 12/27/2018 Occupation: _0 @  Subjective:  Pedal edema    History of Present Illness: Kari Henson is a 53 y.o. female with history of systemic lupus erythematosus, discoid lupus, and ILD.  Patient is taking Imuran 50 mg 1 tablet by mouth daily and Plaquenil 200 mg 1 tablet by mouth twice daily.  She was started on Imuran in December 2019.  She denies any recent lupus flares.  She states she has no some swelling in bilateral ankle joints but denies any joint pain.  She states the swelling is been for the past 2 to 3 weeks but has been slowly improving.  She states that the swelling improves in the morning.  She denies any other joint pain or joint swelling at this time.  She has not had any recent rashes or discoid lesions.  She denies any hair loss.  She has not had any sores in her mouth or nose and denies any sicca symptoms.  She denies any symptoms of Raynaud's.  She reports her level of fatigue has been stable.  She denies any fevers or swollen lymph nodes recently.  Her ILD has been clinically stable and she continues to follow-up with Dr. Lake Bells.  She denies any coughing or new pulmonary symptoms.  She denies any palpitations or chest pain.   Activities of Daily Living:  Patient reports morning stiffness for 0 minutes.   Patient Denies nocturnal pain.  Difficulty dressing/grooming: Denies Difficulty climbing stairs: Denies Difficulty getting out of chair: Denies Difficulty using hands for taps, buttons, cutlery, and/or writing: Denies  Review of Systems  Constitutional: Positive for fatigue.  HENT: Negative for mouth sores, mouth dryness and nose dryness.   Eyes: Negative for pain, visual disturbance and dryness.  Respiratory: Negative for cough, hemoptysis, shortness of breath and  difficulty breathing.   Cardiovascular: Positive for swelling in legs/feet. Negative for chest pain, palpitations and hypertension.  Gastrointestinal: Negative for blood in stool, constipation and diarrhea.  Endocrine: Negative for increased urination.  Genitourinary: Negative for painful urination.  Musculoskeletal: Negative for arthralgias, joint pain, joint swelling, myalgias, muscle weakness, morning stiffness, muscle tenderness and myalgias.  Skin: Negative for color change, pallor, rash, hair loss, nodules/bumps, skin tightness, ulcers and sensitivity to sunlight.  Allergic/Immunologic: Negative for susceptible to infections.  Neurological: Negative for dizziness, numbness, headaches and weakness.  Hematological: Negative for swollen glands.  Psychiatric/Behavioral: Negative for depressed mood and sleep disturbance. The patient is not nervous/anxious.     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  She has been on Prednisone and Plaquenil    Family History  Problem Relation Age of Onset   Asthma Mother    Thyroid disease Mother     Hypertension Mother    Cirrhosis Father    Alcohol abuse Father    Diabetes Father    COPD Father    Diabetes Maternal Aunt    Hypertension Sister    Healthy Brother    Diabetes Maternal Uncle    Diabetes Paternal Aunt    Hypertension Maternal Grandmother    COPD Maternal Grandmother    Arthritis Maternal Grandmother        rheumatoid arthritis   Healthy Son    Colon cancer Neg Hx    Past Surgical History:  Procedure Laterality Date   CESAREAN SECTION     COLONOSCOPY N/A 04/27/2014   SLF:  1. one colon polyp removed 2 The left colonis redundant 3. the examined terminal ileum apperared to be  normal 3. small internal hemorrohoids.   ESOPHAGOGASTRODUODENOSCOPY N/A 04/27/2014   SLF: 1. No obvious source for abdominal pain identified. 2. Non-erosive gastritis (inflammation) was found in the gastric antrum; multiple biopsies were performed.   HERNIA REPAIR     MULTIPLE TOOTH EXTRACTIONS     Social History   Social History Narrative   Not on file   Immunization History  Administered Date(s) Administered   Influenza Split 03/24/2016   Influenza-Unspecified 04/19/2017   Pneumococcal Polysaccharide-23 07/24/2013, 07/29/2018     Objective: Vital Signs: BP 136/89 (BP Location: Left Arm, Patient Position: Sitting, Cuff Size: Normal)    Pulse (!) 114    Resp 15    Ht _0  (1.575 m)    Wt 144 lb 3.2 oz (65.4 kg)    LMP 07/01/2013    BMI 26.37 kg/m    Physical Exam Vitals signs and nursing note reviewed.  Constitutional:      Appearance: She is well-developed.  HENT:     Head: Normocephalic and atraumatic.  Eyes:     Conjunctiva/sclera: Conjunctivae normal.  Neck:     Musculoskeletal: Normal range of motion.  Cardiovascular:     Rate and Rhythm: Normal rate and regular rhythm.     Heart sounds: Normal heart sounds.  Pulmonary:     Effort: Pulmonary effort is normal.     Breath sounds: Normal breath sounds.  Abdominal:     General: Bowel sounds are  normal.     Palpations: Abdomen is soft.  Lymphadenopathy:     Cervical: No cervical adenopathy.  Skin:    General: Skin is warm and dry.     Capillary Refill: Capillary refill takes less than 2 seconds.  Neurological:     Mental Status: She is alert and oriented to person, place, and time.  Psychiatric:        Behavior: Behavior normal.      Musculoskeletal Exam: C-spine, thoracic spine, lumbar spine good range of motion.  No midline spinal tenderness.  No SI joint tenderness.  Shoulder joints, elbow joints, wrist joints, MCPs, PIPs, DIPs good range of motion no synovitis.  She has complete fist formation bilaterally.  Hip joints, knee joints, ankle joints, MTPs, PIPs, DIPs good range of motion no synovitis.  No warmth or effusion bilateral  knee joints.  No tenderness of ankle joints noted.  She does have pedal edema bilaterally.  No tenderness over trochanteric bursa bilaterally.  CDAI Exam: CDAI Score: -- Patient Global: --; Provider Global: -- Swollen: --; Tender: -- Joint Exam   No joint exam has been documented for this visit   There is currently no information documented on the homunculus. Go to the Rheumatology activity and complete the homunculus joint exam.  Investigation: No additional findings.  Imaging: No results found.  Recent Labs: Lab Results  Component Value Date   WBC 4.5 05/10/2018   HGB 12.0 05/10/2018   PLT 262 05/10/2018   NA 143 05/10/2018   K 3.6 05/10/2018   CL 104 05/10/2018   CO2 29 05/10/2018   GLUCOSE 77 05/10/2018   BUN 7 05/10/2018   CREATININE 0.68 05/10/2018   BILITOT 0.3 05/10/2018   ALKPHOS 114 03/14/2014   AST 16 05/10/2018   ALT 10 05/10/2018   PROT 7.7 05/10/2018   ALBUMIN 3.7 02/14/2014   CALCIUM 10.1 05/10/2018   GFRAA 116 05/10/2018   QFTBGOLDPLUS NEGATIVE 06/23/2018    Speciality Comments: PLQ Eye Exam: 07/08/18 @ [facility]  Procedures:  No procedures performed Allergies: Patient has no  known allergies.   Assessment / Plan:     Visit Diagnoses: Other systemic lupus erythematosus with other organ involvement ([facility]) - Positive ANA, DS DNA, Ro, Smith, RNP,elevated ESR, malar rash, photosensitivity, neutropenia, hypocomplementemia: She has not had any signs or symptoms of a lupus flare recently.  She is clinically doing well on Imuran 50 mg 1 tablet by mouth daily and Plaquenil 200 mg 1 tablet by mouth twice daily.  She has been tolerating both medications well and has not missed any doses.  She has no synovitis on exam.  She has no joint pain or joint swelling at this time.  She does have pedal edema bilaterally.  She has not had any new or worsening pulmonary symptoms.  She has not had any new discoid lesions or recent rashes.  She has not had any symptoms of Raynaud's and no digital ulcerations or signs of gangrene were noted.  She has no sores in her mouth or nose.  She has not had any sicca symptoms.  She denies any palpitations or chest pain recently.  Her level fatigue has been stable.  She was advised to notify us if she develops any new or worsening symptoms.  Her lab work on 10/05/2018 revealed C4 of 7, C3 131, vitamin D 78.4, double-stranded DNA 1, sed rate 58.  Her C4 has been improving.  We will continue to monitor lab work on a regular basis.  CBC and CMP will be updated today.  She does not need any refills at this time.  She will follow-up in the office in 5 months.  High risk medication use- Imuran 50 mg 1 tablet daily and Plaquenil 200 mg 1 tablet twice daily. (She started on Imuran in December 2019).  Last Plaquenil eye exam normal on 07/08/2018.  Most recent CBC/CMP within normal limits on 10/05/2018.  Due for CBC/CMP in July and will monitor every 3 months. We will check CBC and CMP today. Standing orders are in place.  She is received 2 doses of Pneumovax 23 vaccine. - Plan: COMPLETE METABOLIC PANEL WITH GFR, CBC with Differential/Platelet  Discoid lupus - She has no new discoid  lesions.  She was encouraged to wear sunscreen daily.  Lung fibrosis ([place]) - Stable on  Chest CT from 08/05/16   ILD (interstitial lung disease) [facility]) - Patient is followed by Dr. Lake Bells.  She has not had any new or worsening pulmonary symptoms.  Most recent PFT and 6-minute walk test were on 10/08/2017 and 09/28/2017 respectively.  She had a high-resolution chest CT performed on 08/05/2016 which was stable.  She is due for a follow-up visit with Dr. Lake Bells but has had difficulty scheduling a visit due to the COVID-19 pandemic.  She was advised to notify us if she develops any new or worsening symptoms.  Clubbing of fingers-Stable  Other fatigue - Chronic but stable.   Vitamin D deficiency - Vitamin D was 78.4 on 10/05/18.    History of bone density study   MGUS (monoclonal gammopathy of unknown significance)  Orders: Orders Placed This Encounter  Procedures   COMPLETE METABOLIC PANEL WITH GFR   CBC with Differential/Platelet   No orders of the defined types were placed in this encounter.     Follow-Up Instructions: Return in about 5 months (around 05/29/2019) for Systemic lupus erythematosus.   Ofilia Neas, PA-C   I examined and evaluated the patient with [name] PA.  Patient had no synovitis on examination.  The plan of care was discussed as noted above.  Bo Merino, MD  Note - This record has been created using Editor, commissioning.  Chart creation errors have been sought, but may not always  have been located. Such creation errors do not reflect on  the standard of medical care.

## 2018-12-22 ENCOUNTER — Other Ambulatory Visit: Payer: Self-pay | Admitting: Rheumatology

## 2018-12-22 NOTE — Telephone Encounter (Signed)
Last Visit: 09/26/18 Next visit: 12/27/18 Labs: 10/06/18 MCH MCH 25.9 EOS (abs) 0.7 BUN 5 BUN/Creat. Ratio 8 C4-7 Sed Rate- 58 PLQ Eye Exam: 07/08/18 WNL  Okay to refill per Dr. Estanislado Pandy

## 2018-12-27 ENCOUNTER — Encounter: Payer: Self-pay | Admitting: Rheumatology

## 2018-12-27 ENCOUNTER — Ambulatory Visit (INDEPENDENT_AMBULATORY_CARE_PROVIDER_SITE_OTHER): Payer: Medicare Other | Admitting: Rheumatology

## 2018-12-27 ENCOUNTER — Other Ambulatory Visit: Payer: Self-pay

## 2018-12-27 VITALS — BP 136/89 | HR 114 | Resp 15 | Ht 62.0 in | Wt 144.2 lb

## 2018-12-27 DIAGNOSIS — L930 Discoid lupus erythematosus: Secondary | ICD-10-CM | POA: Diagnosis not present

## 2018-12-27 DIAGNOSIS — J849 Interstitial pulmonary disease, unspecified: Secondary | ICD-10-CM

## 2018-12-27 DIAGNOSIS — J8410 Pulmonary fibrosis, unspecified: Secondary | ICD-10-CM | POA: Diagnosis not present

## 2018-12-27 DIAGNOSIS — Z9289 Personal history of other medical treatment: Secondary | ICD-10-CM | POA: Diagnosis not present

## 2018-12-27 DIAGNOSIS — R683 Clubbing of fingers: Secondary | ICD-10-CM | POA: Diagnosis not present

## 2018-12-27 DIAGNOSIS — Z79899 Other long term (current) drug therapy: Secondary | ICD-10-CM

## 2018-12-27 DIAGNOSIS — D472 Monoclonal gammopathy: Secondary | ICD-10-CM

## 2018-12-27 DIAGNOSIS — M3219 Other organ or system involvement in systemic lupus erythematosus: Secondary | ICD-10-CM

## 2018-12-27 DIAGNOSIS — E559 Vitamin D deficiency, unspecified: Secondary | ICD-10-CM

## 2018-12-27 DIAGNOSIS — R5383 Other fatigue: Secondary | ICD-10-CM

## 2018-12-28 LAB — COMPLETE METABOLIC PANEL WITH GFR
AG Ratio: 1.1 (calc) (ref 1.0–2.5)
ALT: 11 U/L (ref 6–29)
AST: 28 U/L (ref 10–35)
Albumin: 3.9 g/dL (ref 3.6–5.1)
Alkaline phosphatase (APISO): 80 U/L (ref 37–153)
BUN/Creatinine Ratio: 7 (calc) (ref 6–22)
BUN: 5 mg/dL — ABNORMAL LOW (ref 7–25)
CO2: 28 mmol/L (ref 20–32)
Calcium: 9.9 mg/dL (ref 8.6–10.4)
Chloride: 100 mmol/L (ref 98–110)
Creat: 0.68 mg/dL (ref 0.50–1.05)
GFR, Est African American: 116 mL/min/{1.73_m2} (ref >=60)
GFR, Est Non African American: 100 mL/min/{1.73_m2} (ref >=60)
Globulin: 3.5 g/dL (calc) (ref 1.9–3.7)
Glucose, Bld: 83 mg/dL (ref 65–99)
Potassium: 3.5 mmol/L (ref 3.5–5.3)
Sodium: 139 mmol/L (ref 135–146)
Total Bilirubin: 0.3 mg/dL (ref 0.2–1.2)
Total Protein: 7.4 g/dL (ref 6.1–8.1)

## 2018-12-28 LAB — CBC WITH DIFFERENTIAL/PLATELET
Absolute Monocytes: 724 cells/uL (ref 200–950)
Basophils Absolute: 63 cells/uL (ref 0–200)
Basophils Relative: 1.1 %
Eosinophils Absolute: 365 cells/uL (ref 15–500)
Eosinophils Relative: 6.4 %
HCT: 38.5 % (ref 35.0–45.0)
Hemoglobin: 12.2 g/dL (ref 11.7–15.5)
Lymphs Abs: 798 cells/uL — ABNORMAL LOW (ref 850–3900)
MCH: 26.1 pg — ABNORMAL LOW (ref 27.0–33.0)
MCHC: 31.7 g/dL — ABNORMAL LOW (ref 32.0–36.0)
MCV: 82.4 fL (ref 80.0–100.0)
MPV: 11.4 fL (ref 7.5–12.5)
Monocytes Relative: 12.7 %
Neutro Abs: 3751 cells/uL (ref 1500–7800)
Neutrophils Relative %: 65.8 %
Platelets: 332 10*3/uL (ref 140–400)
RBC: 4.67 10*6/uL (ref 3.80–5.10)
RDW: 13.2 % (ref 11.0–15.0)
Total Lymphocyte: 14 %
WBC: 5.7 10*3/uL (ref 3.8–10.8)

## 2018-12-28 NOTE — Progress Notes (Signed)
Labs are stable.

## 2019-01-19 ENCOUNTER — Other Ambulatory Visit: Payer: Self-pay | Admitting: Rheumatology

## 2019-01-19 NOTE — Telephone Encounter (Signed)
Last Visit: 12/27/18 Next Visit: 05/30/19 Labs: 12/27/18 stable  Okay to refill per Dr. Estanislado Pandy

## 2019-03-22 ENCOUNTER — Other Ambulatory Visit: Payer: Self-pay | Admitting: Rheumatology

## 2019-03-22 NOTE — Telephone Encounter (Signed)
Last Visit: 12/27/18 Next Visit: 05/30/19 Labs: 12/27/18 stable PLQ Eye Exam: 07/08/18   Okay to refill per Dr. Estanislado Pandy

## 2019-04-13 DIAGNOSIS — Z23 Encounter for immunization: Secondary | ICD-10-CM | POA: Diagnosis not present

## 2019-04-14 DIAGNOSIS — S29012A Strain of muscle and tendon of back wall of thorax, initial encounter: Secondary | ICD-10-CM | POA: Diagnosis not present

## 2019-04-14 DIAGNOSIS — M329 Systemic lupus erythematosus, unspecified: Secondary | ICD-10-CM | POA: Diagnosis not present

## 2019-04-14 DIAGNOSIS — S29019A Strain of muscle and tendon of unspecified wall of thorax, initial encounter: Secondary | ICD-10-CM | POA: Diagnosis not present

## 2019-04-14 DIAGNOSIS — M25511 Pain in right shoulder: Secondary | ICD-10-CM | POA: Diagnosis not present

## 2019-04-14 DIAGNOSIS — X58XXXA Exposure to other specified factors, initial encounter: Secondary | ICD-10-CM | POA: Diagnosis not present

## 2019-04-14 DIAGNOSIS — Z79899 Other long term (current) drug therapy: Secondary | ICD-10-CM | POA: Diagnosis not present

## 2019-04-14 DIAGNOSIS — F329 Major depressive disorder, single episode, unspecified: Secondary | ICD-10-CM | POA: Diagnosis not present

## 2019-04-14 DIAGNOSIS — E7800 Pure hypercholesterolemia, unspecified: Secondary | ICD-10-CM | POA: Diagnosis not present

## 2019-04-14 DIAGNOSIS — Z87891 Personal history of nicotine dependence: Secondary | ICD-10-CM | POA: Diagnosis not present

## 2019-04-22 ENCOUNTER — Other Ambulatory Visit: Payer: Self-pay | Admitting: Rheumatology

## 2019-04-24 NOTE — Telephone Encounter (Addendum)
Last Visit: 12/27/18 Next Visit: 05/30/19 Labs: 12/27/18 stable  Patient advised she is due to update labs. Patient will update labs this week.   Okay to refill 30 day supply per Dr. Estanislado Pandy

## 2019-04-28 ENCOUNTER — Other Ambulatory Visit: Payer: Self-pay | Admitting: *Deleted

## 2019-04-28 ENCOUNTER — Telehealth: Payer: Self-pay | Admitting: Rheumatology

## 2019-04-28 DIAGNOSIS — Z79899 Other long term (current) drug therapy: Secondary | ICD-10-CM

## 2019-04-28 NOTE — Telephone Encounter (Signed)
Patient is in need of a new Pulmonologist. Dr. Lake Bells has moved on. Please call to advise patient whom Dr. Estanislado Pandy would recommend.

## 2019-04-29 LAB — CBC WITH DIFFERENTIAL/PLATELET
Absolute Monocytes: 670 cells/uL (ref 200–950)
Basophils Absolute: 50 cells/uL (ref 0–200)
Basophils Relative: 0.8 %
Eosinophils Absolute: 360 cells/uL (ref 15–500)
Eosinophils Relative: 5.8 %
HCT: 37.5 % (ref 35.0–45.0)
Hemoglobin: 12 g/dL (ref 11.7–15.5)
Lymphs Abs: 570 cells/uL — ABNORMAL LOW (ref 850–3900)
MCH: 26.1 pg — ABNORMAL LOW (ref 27.0–33.0)
MCHC: 32 g/dL (ref 32.0–36.0)
MCV: 81.7 fL (ref 80.0–100.0)
MPV: 11.6 fL (ref 7.5–12.5)
Monocytes Relative: 10.8 %
Neutro Abs: 4551 cells/uL (ref 1500–7800)
Neutrophils Relative %: 73.4 %
Platelets: 345 10*3/uL (ref 140–400)
RBC: 4.59 10*6/uL (ref 3.80–5.10)
RDW: 13.1 % (ref 11.0–15.0)
Total Lymphocyte: 9.2 %
WBC: 6.2 10*3/uL (ref 3.8–10.8)

## 2019-04-29 LAB — COMPLETE METABOLIC PANEL WITH GFR
AG Ratio: 1.1 (calc) (ref 1.0–2.5)
ALT: 9 U/L (ref 6–29)
AST: 25 U/L (ref 10–35)
Albumin: 3.6 g/dL (ref 3.6–5.1)
Alkaline phosphatase (APISO): 76 U/L (ref 37–153)
BUN/Creatinine Ratio: 9 (calc) (ref 6–22)
BUN: 5 mg/dL — ABNORMAL LOW (ref 7–25)
CO2: 28 mmol/L (ref 20–32)
Calcium: 9.8 mg/dL (ref 8.6–10.4)
Chloride: 100 mmol/L (ref 98–110)
Creat: 0.54 mg/dL (ref 0.50–1.05)
GFR, Est African American: 124 mL/min/{1.73_m2} (ref >=60)
GFR, Est Non African American: 107 mL/min/{1.73_m2} (ref >=60)
Globulin: 3.2 g/dL (calc) (ref 1.9–3.7)
Glucose, Bld: 89 mg/dL (ref 65–99)
Potassium: 3.4 mmol/L — ABNORMAL LOW (ref 3.5–5.3)
Sodium: 138 mmol/L (ref 135–146)
Total Bilirubin: 0.5 mg/dL (ref 0.2–1.2)
Total Protein: 6.8 g/dL (ref 6.1–8.1)

## 2019-05-10 ENCOUNTER — Telehealth: Payer: Self-pay | Admitting: *Deleted

## 2019-05-10 NOTE — Telephone Encounter (Signed)
Dr. Lake Bells is working at the home at hospital.  She will have to call the pulmonary office in have them assign her another physician.

## 2019-05-10 NOTE — Telephone Encounter (Signed)
Patient advised Dr. Lake Bells is working at the [facility].  She will have to call the pulmonary office in have them assign her another physician. Patient verbalzied understanding.

## 2019-05-10 NOTE — Telephone Encounter (Signed)
See previous phone note.

## 2019-05-10 NOTE — Telephone Encounter (Signed)
Patient in need of a new pulmonologist. Patient states Dr. Lake Bells has moved on. Patient would like to know whom you recommend. Please advise.

## 2019-05-16 NOTE — Progress Notes (Deleted)
Office Visit Note  Patient: Kari Henson             Date of Birth: 29-Dec-1965           MRN: [id number]             PCP: [name], NP Referring: [name], NP Visit Date: 05/30/2019 Occupation: _0 @  Subjective:  No chief complaint on file.   History of Present Illness: Kari Henson is a 53 y.o. female ***   Activities of Daily Living:  Patient reports morning stiffness for *** {minute/hour:19697}.   Patient {ACTIONS;DENIES/REPORTS:21021675::"Denies"} nocturnal pain.  Difficulty dressing/grooming: {ACTIONS;DENIES/REPORTS:21021675::"Denies"} Difficulty climbing stairs: {ACTIONS;DENIES/REPORTS:21021675::"Denies"} Difficulty getting out of chair: {ACTIONS;DENIES/REPORTS:21021675::"Denies"} Difficulty using hands for taps, buttons, cutlery, and/or writing: {ACTIONS;DENIES/REPORTS:21021675::"Denies"}  No Rheumatology ROS completed.   PMFS History:  Patient Active Problem List   Diagnosis Date Noted  . 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 Vitamin D deficiency 04/30/2017  . High risk medication use 04/30/2017  . History of bone density study 04/30/2017  . Chronic cough 07/24/2016  . ILD (interstitial lung disease) ([place]) 07/24/2016  . Abdominal pain, epigastric 03/29/2014  . Encounter for screening colonoscopy 03/29/2014  . 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 SLE (systemic lupus erythematosus) ([place])   . Lung fibrosis ([place])     Past Medical History:  Diagnosis Date  . GERD (gastroesophageal reflux disease)   . Lung fibrosis ([place])    follows with a Dr. Ouida Sills, pulmonary in [place], [place]  . 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 Seasonal allergies   . SLE (systemic lupus erythematosus) ([place]) 2010   she was on MTX (d/c due to cytopenia).  She has been on Prednisone and Plaquenil    Family History  Problem Relation Age of Onset  . Asthma Mother   . Thyroid disease Mother    . Hypertension Mother   . Cirrhosis Father   . Alcohol abuse Father   . Diabetes Father   . COPD Father   . Diabetes Maternal Aunt   . Hypertension Sister   . Healthy Brother   . Diabetes Maternal Uncle   . Diabetes Paternal Aunt   . Hypertension Maternal Grandmother   . COPD Maternal Grandmother   . Arthritis Maternal Grandmother        rheumatoid arthritis  . Healthy Son   . Colon cancer Neg Hx    Past Surgical History:  Procedure Laterality Date  . CESAREAN SECTION    . COLONOSCOPY N/A 04/27/2014   SLF:  1. one colon polyp removed 2 The left colonis redundant 3. the examined terminal ileum apperared to be  normal 3. small internal hemorrohoids.  . ESOPHAGOGASTRODUODENOSCOPY N/A 04/27/2014   SLF: 1. No obvious source for abdominal pain identified. 2. Non-erosive gastritis (inflammation) was found in the gastric antrum; multiple biopsies were performed.  Marland Kitchen HERNIA REPAIR    . MULTIPLE TOOTH EXTRACTIONS     Social History   Social History Narrative  . Not on file   Immunization History  Administered Date(s) Administered  . Influenza Split 03/24/2016  . Influenza-Unspecified 04/19/2017  . Pneumococcal Polysaccharide-23 07/24/2013, 07/29/2018     Objective: Vital Signs: LMP 07/01/2013    Physical Exam   Musculoskeletal Exam: ***  CDAI Exam: CDAI Score: - Patient Global: -; Provider Global: - Swollen: -; Tender: - Joint Exam   No joint exam has been documented for this visit   There is currently no information  documented on the homunculus. Go to the Rheumatology activity and complete the homunculus joint exam.  Investigation: No additional findings.  Imaging: No results found.  Recent Labs: Lab Results  Component Value Date   WBC 6.2 04/28/2019   HGB 12.0 04/28/2019   PLT 345 04/28/2019   NA 138 04/28/2019   K 3.4 (L) 04/28/2019   CL 100 04/28/2019   CO2 28 04/28/2019   GLUCOSE 89 04/28/2019   BUN 5 (L) 04/28/2019   CREATININE 0.54 04/28/2019    BILITOT 0.5 04/28/2019   ALKPHOS 114 03/14/2014   AST 25 04/28/2019   ALT 9 04/28/2019   PROT 6.8 04/28/2019   ALBUMIN 3.7 02/14/2014   CALCIUM 9.8 04/28/2019   GFRAA 124 04/28/2019   QFTBGOLDPLUS NEGATIVE 06/23/2018    Speciality Comments: PLQ Eye Exam: 07/08/18 @ [facility]  Procedures:  No procedures performed Allergies: Patient has no known allergies.   Assessment / Plan:     Visit Diagnoses: No diagnosis found.  Orders: No orders of the defined types were placed in this encounter.  No orders of the defined types were placed in this encounter.   Face-to-face time spent with patient was *** minutes. Greater than 50% of time was spent in counseling and coordination of care.  Follow-Up Instructions: No follow-ups on file.   Earnestine Mealing, CMA  Note - This record has been created using Editor, commissioning.  Chart creation errors have been sought, but may not always  have been located. Such creation errors do not reflect on  the standard of medical care.

## 2019-05-24 DIAGNOSIS — Z87891 Personal history of nicotine dependence: Secondary | ICD-10-CM | POA: Diagnosis not present

## 2019-05-24 DIAGNOSIS — Z299 Encounter for prophylactic measures, unspecified: Secondary | ICD-10-CM | POA: Diagnosis not present

## 2019-05-24 DIAGNOSIS — Z6822 Body mass index (BMI) 22.0-22.9, adult: Secondary | ICD-10-CM | POA: Diagnosis not present

## 2019-05-24 DIAGNOSIS — E785 Hyperlipidemia, unspecified: Secondary | ICD-10-CM | POA: Diagnosis not present

## 2019-05-24 DIAGNOSIS — E876 Hypokalemia: Secondary | ICD-10-CM | POA: Diagnosis not present

## 2019-05-24 DIAGNOSIS — J449 Chronic obstructive pulmonary disease, unspecified: Secondary | ICD-10-CM | POA: Diagnosis not present

## 2019-05-24 DIAGNOSIS — J441 Chronic obstructive pulmonary disease with (acute) exacerbation: Secondary | ICD-10-CM | POA: Diagnosis not present

## 2019-05-29 ENCOUNTER — Telehealth (INDEPENDENT_AMBULATORY_CARE_PROVIDER_SITE_OTHER): Payer: Medicare Other | Admitting: Rheumatology

## 2019-05-29 ENCOUNTER — Encounter: Payer: Self-pay | Admitting: Rheumatology

## 2019-05-29 ENCOUNTER — Telehealth: Payer: Medicare Other | Admitting: Rheumatology

## 2019-05-29 ENCOUNTER — Other Ambulatory Visit: Payer: Self-pay

## 2019-05-29 DIAGNOSIS — Z79899 Other long term (current) drug therapy: Secondary | ICD-10-CM | POA: Diagnosis not present

## 2019-05-29 DIAGNOSIS — D472 Monoclonal gammopathy: Secondary | ICD-10-CM | POA: Diagnosis not present

## 2019-05-29 DIAGNOSIS — Z87891 Personal history of nicotine dependence: Secondary | ICD-10-CM | POA: Diagnosis not present

## 2019-05-29 DIAGNOSIS — E559 Vitamin D deficiency, unspecified: Secondary | ICD-10-CM | POA: Diagnosis not present

## 2019-05-29 DIAGNOSIS — R5383 Other fatigue: Secondary | ICD-10-CM

## 2019-05-29 DIAGNOSIS — M3219 Other organ or system involvement in systemic lupus erythematosus: Secondary | ICD-10-CM | POA: Diagnosis not present

## 2019-05-29 DIAGNOSIS — R683 Clubbing of fingers: Secondary | ICD-10-CM | POA: Diagnosis not present

## 2019-05-29 DIAGNOSIS — J8410 Pulmonary fibrosis, unspecified: Secondary | ICD-10-CM | POA: Diagnosis not present

## 2019-05-29 DIAGNOSIS — J849 Interstitial pulmonary disease, unspecified: Secondary | ICD-10-CM | POA: Diagnosis not present

## 2019-05-29 DIAGNOSIS — L930 Discoid lupus erythematosus: Secondary | ICD-10-CM

## 2019-05-29 MED ORDER — AZATHIOPRINE 50 MG PO TABS: ORAL_TABLET | ORAL | 0 refills | Status: DC

## 2019-05-29 NOTE — Progress Notes (Signed)
Virtual Visit via Telephone Note  I connected with Kari Henson on 05/29/19 at  9:55 AM EST by telephone and verified that I am speaking with the correct person using two identifiers.  Location: Patient: Home  Provider: Clinic  This service was conducted via virtual visit.  Both audio and visual tools were used.  The patient was located at home. I was located in my office.  Consent was obtained prior to the virtual visit and is aware of possible charges through their insurance for this visit.  The patient is an established patient.  Dr. Estanislado Pandy, MD conducted the virtual visit and [name], PA-C acted as scribe during the service.  Office staff helped with scheduling follow up visits after the service was conducted.   I discussed the limitations, risks, security and privacy concerns of performing an evaluation and management service by telephone and the availability of in person appointments. I also discussed with the patient that there may be a patient responsible charge related to this service. The patient expressed understanding and agreed to proceed.  CC: Discuss medications  History of Present Illness: Patient is a 53 year old female with a past medical history of systemic lupus erythematosus and ILD.  She is taking Imuran 50 mg po daily and Plaquenil 200 mg 1 tablet by mouth twice daily.  She denies any recent lupus flares.  She denies any joint pain or joint swelling.  She has not had any edema recently.  She denies any recent rashes or discoid lesions.  She denies any sores in mouth or nose.  She denies any sicca symptoms.  She denies any symptoms of Raynaud's.  She denies any increased shortness of breath, palpitations, or chest pain.  She has not seen a new pulmonologist since Dr. Lake Bells retired.   Review of Systems  Constitutional: Negative for fever and malaise/fatigue.  Eyes: Negative for photophobia, pain, discharge and redness.  Respiratory: Negative for cough, shortness of  breath and wheezing.   Cardiovascular: Negative for chest pain and palpitations.  Gastrointestinal: Negative for blood in stool, constipation and diarrhea.  Genitourinary: Negative for dysuria.  Musculoskeletal: Negative for back pain, joint pain, myalgias and neck pain.  Skin: Negative for rash.  Neurological: Negative for dizziness and headaches.  Psychiatric/Behavioral: Negative for depression. The patient is not nervous/anxious and does not have insomnia.       Observations/Objective: Physical Exam  Constitutional: She is oriented to person, place, and time.  Neurological: She is alert and oriented to person, place, and time.  Psychiatric: Mood, memory, affect and judgment normal.   Patient reports morning stiffness for 0 minutes.   Patient denies nocturnal pain.  Difficulty dressing/grooming: Denies Difficulty climbing stairs: Denies Difficulty getting out of chair: Denies Difficulty using hands for taps, buttons, cutlery, and/or writing: Denies   Assessment and Plan: Visit Diagnoses: Other systemic lupus erythematosus with other organ involvement (HCC) - Positive ANA, DS DNA, Ro, Smith, RNP,elevated ESR, malar rash, photosensitivity, neutropenia, hypocomplementemia: She has not had any signs or symptoms of lupus flare recently. She is clinically doing well on Imuran 50 mg 1 tablet by mouth daily and Plaquenil 200 mg 1 tablet by mouth twice daily.  Sed rate 58 on 10/05/18.  She was advised to increase Imuran to 100 mg po daily.  A new prescription for Imuran was sent to the pharmacy today. She will return for lab work in 2 weeks to monitor for drug toxicity.  She was advised to notify us if she  develops any signs or symptoms of a flare.  She will follow up in 1 month.   High risk medication use- She has been taking Imuran 50 mg 1 tablet daily and Plaquenil 200 mg 1 tablet twice daily. (She started on Imuran in December 2019).  She will increase Imuran to 100 mg po daily. She will  return for lab work 2 weeks to monitor for drug toxicity. Standing orders are in place. Last Plaquenil eye exam normal on 07/08/2018.    Discoid lupus - She has not had any recent discoid lesions.   Lung fibrosis ([place]) - Stable on Chest CT from 08/05/16.   ILD (interstitial lung disease) ([place]) - Patient was previously followed by Dr. Lake Bells, but she will need to establish care with a new pulmonologist. She has not had any new or worsening pulmonary symptoms.  Most recent PFT and 6-minute walk test were on 10/08/2017 and 09/28/2017 respectively.  She had a high-resolution chest CT performed on 08/05/2016 which was stable.    Clubbing of fingers-Stable  Other fatigue - Chronic but stable.   Other medical conditions are listed as follows:   Vitamin D deficiency - Vitamin D was 78.4 on 10/05/18.    History of bone density study   MGUS (monoclonal gammopathy of unknown significance  Follow Up Instructions: She will follow up in 1 month.    I discussed the assessment and treatment plan with the patient. The patient was provided an opportunity to ask questions and all were answered. The patient agreed with the plan and demonstrated an understanding of the instructions.   The patient was advised to call back or seek an in-person evaluation if the symptoms worsen or if the condition fails to improve as anticipated.  I provided 25 minutes of non-face-to-face time during this encounter.   Bo Merino, MD   Scribed by-  [name] PA-C

## 2019-05-30 ENCOUNTER — Ambulatory Visit: Payer: Medicare Other | Admitting: Rheumatology

## 2019-06-15 ENCOUNTER — Ambulatory Visit (INDEPENDENT_AMBULATORY_CARE_PROVIDER_SITE_OTHER): Payer: Medicare Other | Admitting: Pulmonary Disease

## 2019-06-15 ENCOUNTER — Telehealth: Payer: Self-pay | Admitting: Rheumatology

## 2019-06-15 ENCOUNTER — Encounter: Payer: Self-pay | Admitting: Pulmonary Disease

## 2019-06-15 ENCOUNTER — Other Ambulatory Visit: Payer: Self-pay

## 2019-06-15 DIAGNOSIS — J849 Interstitial pulmonary disease, unspecified: Secondary | ICD-10-CM | POA: Diagnosis not present

## 2019-06-15 DIAGNOSIS — M3219 Other organ or system involvement in systemic lupus erythematosus: Secondary | ICD-10-CM

## 2019-06-15 DIAGNOSIS — Z79899 Other long term (current) drug therapy: Secondary | ICD-10-CM

## 2019-06-15 NOTE — Telephone Encounter (Signed)
Patient called requesting her labwork orders be sent to [facility] in [place].  Patient states she will be going tomorrow morning 06/16/19.

## 2019-06-15 NOTE — Patient Instructions (Signed)
Glad you are stable with your breathing We will order high-resolution CT for evaluation of the interstitial lung disease You will also need lung function tests.  Will order spirometry and diffusion capacity Follow-up in clinic after PFTs to review.

## 2019-06-15 NOTE — Telephone Encounter (Signed)
Lab orders have been released for [facility].

## 2019-06-15 NOTE — Progress Notes (Signed)
Virtual Visit via Telephone Note  I connected with Kari Henson on 06/15/19 at 11:00 AM EST by telephone and verified that I am speaking with the correct person using two identifiers.  Location: Patient: Home Provider: [facility], [place], [place]   I discussed the limitations, risks, security and privacy concerns of performing an evaluation and management service by telephone and the availability of in person appointments. I also discussed with the patient that there may be a patient responsible charge related to this service. The patient expressed understanding and agreed to proceed.   History of Present Illness: Follow-up for interstitial lung disease  53 year old with history of systemic lupus erythematosus, NSIP ILD She follows with Dr. Estanislado Pandy and is on Imuran 50 mg daily and Plaquenil. Previously followed by Dr. Lake Bells and is here to reestablish care.  Observations/Objective: States that breathing is doing stable.  She had mild increase in cough with yellow sputum with symptoms of bronchitis last week but overall she is doing well with no increase in dyspnea.  CT high-resolution 08/05/2016-basilar subpleural reticulation, traction bronchiectasis, paraseptal emphysema. I have reviewed the images personally.  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  Follow Up Instructions: Follow-up in clinic  in 2 to 4 weeks after high-res CT, PFTs   I discussed the assessment and treatment plan with the patient. The patient was provided an opportunity to ask questions and all were answered. The patient agreed with the plan and demonstrated an understanding of the instructions.   The patient was advised to call back or seek an in-person evaluation if the symptoms worsen or if the condition fails to improve as anticipated.  I provided 25 minutes of non-face-to-face time during this encounter.   Marshell Garfinkel MD [facility] 06/15/2019, 11:13 AM

## 2019-06-16 ENCOUNTER — Telehealth: Payer: Self-pay | Admitting: Rheumatology

## 2019-06-16 DIAGNOSIS — M3219 Other organ or system involvement in systemic lupus erythematosus: Secondary | ICD-10-CM

## 2019-06-16 DIAGNOSIS — Z79899 Other long term (current) drug therapy: Secondary | ICD-10-CM

## 2019-06-16 NOTE — Telephone Encounter (Signed)
-----  Message from Shona Needles, RT sent at 05/31/2019  2:53 PM EST ----- Regarding: IN OFFICE VISIT IN 1 MONTH

## 2019-06-16 NOTE — Telephone Encounter (Signed)
Patient going to [place] today. Please release orders.

## 2019-06-16 NOTE — Telephone Encounter (Signed)
Lab orders released for [facility].

## 2019-06-16 NOTE — Telephone Encounter (Signed)
LMOM for patient to call and schedule her in-office follow-up appointment in February.

## 2019-06-18 LAB — CBC WITH DIFFERENTIAL/PLATELET
Basophils Absolute: 0.1 10*3/uL (ref 0.0–0.2)
Basos: 1 %
EOS (ABSOLUTE): 0.7 10*3/uL — ABNORMAL HIGH (ref 0.0–0.4)
Eos: 16 %
Hematocrit: 39.4 % (ref 34.0–46.6)
Hemoglobin: 12.5 g/dL (ref 11.1–15.9)
Immature Grans (Abs): 0 10*3/uL (ref 0.0–0.1)
Immature Granulocytes: 0 %
Lymphocytes Absolute: 1.1 10*3/uL (ref 0.7–3.1)
Lymphs: 26 %
MCH: 26.2 pg — ABNORMAL LOW (ref 26.6–33.0)
MCHC: 31.7 g/dL (ref 31.5–35.7)
MCV: 82 fL (ref 79–97)
Monocytes Absolute: 0.7 10*3/uL (ref 0.1–0.9)
Monocytes: 15 %
Neutrophils Absolute: 1.8 10*3/uL (ref 1.4–7.0)
Neutrophils: 42 %
Platelets: 319 10*3/uL (ref 150–450)
RBC: 4.78 x10E6/uL (ref 3.77–5.28)
RDW: 13.3 % (ref 11.7–15.4)
WBC: 4.3 10*3/uL (ref 3.4–10.8)

## 2019-06-18 LAB — CMP14+EGFR
ALT: 10 IU/L (ref 0–32)
AST: 25 IU/L (ref 0–40)
Albumin/Globulin Ratio: 1.4 (ref 1.2–2.2)
Albumin: 4.2 g/dL (ref 3.8–4.9)
Alkaline Phosphatase: 102 IU/L (ref 39–117)
BUN/Creatinine Ratio: 9 (ref 9–23)
BUN: 4 mg/dL — ABNORMAL LOW (ref 6–24)
Bilirubin Total: 0.3 mg/dL (ref 0.0–1.2)
CO2: 25 mmol/L (ref 20–29)
Calcium: 9.9 mg/dL (ref 8.7–10.2)
Chloride: 98 mmol/L (ref 96–106)
Creatinine, Ser: 0.47 mg/dL — ABNORMAL LOW (ref 0.57–1.00)
GFR calc Af Amer: 130 mL/min/{1.73_m2} (ref >59)
GFR calc non Af Amer: 112 mL/min/{1.73_m2} (ref >59)
Globulin, Total: 3.1 g/dL (ref 1.5–4.5)
Glucose: 90 mg/dL (ref 65–99)
Potassium: 3.7 mmol/L (ref 3.5–5.2)
Sodium: 140 mmol/L (ref 134–144)
Total Protein: 7.3 g/dL (ref 6.0–8.5)

## 2019-06-18 LAB — MICROSCOPIC EXAMINATION
Bacteria, UA: NONE SEEN
Casts: NONE SEEN /lpf
RBC, Urine: NONE SEEN /hpf (ref 0–2)

## 2019-06-18 LAB — C3 AND C4
Complement C3, Serum: 142 mg/dL (ref 82–167)
Complement C4, Serum: 16 mg/dL (ref 12–38)

## 2019-06-18 LAB — URINALYSIS, ROUTINE W REFLEX MICROSCOPIC
Bilirubin, UA: NEGATIVE
Glucose, UA: NEGATIVE
Ketones, UA: NEGATIVE
Nitrite, UA: NEGATIVE
Protein,UA: NEGATIVE
RBC, UA: NEGATIVE
Specific Gravity, UA: 1.006 (ref 1.005–1.030)
Urobilinogen, Ur: 0.2 mg/dL (ref 0.2–1.0)
pH, UA: 6.5 (ref 5.0–7.5)

## 2019-06-18 LAB — SEDIMENTATION RATE: Sed Rate: 95 mm/hr — ABNORMAL HIGH (ref 0–40)

## 2019-06-18 LAB — ANTI-DNA ANTIBODY, DOUBLE-STRANDED: dsDNA Ab: 1 IU/mL (ref 0–9)

## 2019-06-19 ENCOUNTER — Other Ambulatory Visit: Payer: Self-pay | Admitting: Rheumatology

## 2019-06-19 DIAGNOSIS — M3219 Other organ or system involvement in systemic lupus erythematosus: Secondary | ICD-10-CM

## 2019-06-19 NOTE — Telephone Encounter (Signed)
I discussed lab results with the patient.  She is not having a lupus flare.  Autoimmune labs were normal.  Her sed rate is elevated.  Patient states she had recent upper respiratory tract infection and was given antibiotics.  She has been off antibiotic for a week.  I discussed the correlation between the elevated sedimentation rate and infection.  Have advised her to contact her PCP to discuss this further.  I have also advised her to repeat ESR in 2 weeks.  I have placed an order for ESR.  Please forward labs to her PCP.

## 2019-06-20 DIAGNOSIS — D509 Iron deficiency anemia, unspecified: Secondary | ICD-10-CM | POA: Diagnosis not present

## 2019-06-20 DIAGNOSIS — R5383 Other fatigue: Secondary | ICD-10-CM | POA: Diagnosis not present

## 2019-06-21 ENCOUNTER — Ambulatory Visit
Admission: RE | Admit: 2019-06-21 | Discharge: 2019-06-21 | Disposition: A | Discharge status: Home / Self Care | Payer: Medicare Other | Source: Ambulatory Visit | Attending: Pulmonary Disease | Admitting: Pulmonary Disease

## 2019-06-21 DIAGNOSIS — J849 Interstitial pulmonary disease, unspecified: Secondary | ICD-10-CM

## 2019-06-29 ENCOUNTER — Other Ambulatory Visit: Payer: Self-pay | Admitting: Rheumatology

## 2019-06-29 NOTE — Telephone Encounter (Signed)
ok

## 2019-06-29 NOTE — Telephone Encounter (Signed)
Last Visit: 05/29/2019 telemedicine  Next Visit: 08/02/2019 Labs: 06/16/2019 creat 0.47, BUN 4, EOS 0.7, MCH 26.2. Eye exam: 07/08/2018  Okay to refill PLQ?

## 2019-07-10 ENCOUNTER — Encounter (INDEPENDENT_AMBULATORY_CARE_PROVIDER_SITE_OTHER): Payer: Medicare Other | Admitting: Ophthalmology

## 2019-07-21 ENCOUNTER — Other Ambulatory Visit (HOSPITAL_COMMUNITY)
Admission: RE | Admit: 2019-07-21 | Discharge: 2019-07-21 | Disposition: A | Discharge status: Home / Self Care | Payer: Medicare Other | Source: Ambulatory Visit | Attending: Pulmonary Disease | Admitting: Pulmonary Disease

## 2019-07-21 DIAGNOSIS — Z01812 Encounter for preprocedural laboratory examination: Secondary | ICD-10-CM | POA: Insufficient documentation

## 2019-07-21 DIAGNOSIS — Z20822 Contact with and (suspected) exposure to covid-19: Secondary | ICD-10-CM | POA: Insufficient documentation

## 2019-07-21 LAB — SARS CORONAVIRUS 2 (TAT 6-24 HRS): SARS Coronavirus 2: NEGATIVE

## 2019-07-24 ENCOUNTER — Ambulatory Visit (INDEPENDENT_AMBULATORY_CARE_PROVIDER_SITE_OTHER): Payer: Medicare Other | Admitting: Pulmonary Disease

## 2019-07-24 ENCOUNTER — Other Ambulatory Visit: Payer: Self-pay

## 2019-07-24 ENCOUNTER — Encounter: Payer: Self-pay | Admitting: Pulmonary Disease

## 2019-07-24 VITALS — BP 130/66 | HR 110 | Temp 97.9°F | Ht 66.0 in | Wt 135.0 lb

## 2019-07-24 DIAGNOSIS — J849 Interstitial pulmonary disease, unspecified: Secondary | ICD-10-CM

## 2019-07-24 DIAGNOSIS — R05 Cough: Secondary | ICD-10-CM | POA: Diagnosis not present

## 2019-07-24 DIAGNOSIS — R053 Chronic cough: Secondary | ICD-10-CM

## 2019-07-24 LAB — PULMONARY FUNCTION TEST
DL/VA % pred: 83 %
DL/VA: 3.6 ml/min/mmHg/L
DLCO cor % pred: 44 %
DLCO cor: 8.63 ml/min/mmHg
DLCO unc % pred: 43 %
DLCO unc: 8.38 ml/min/mmHg
FEF 25-75 Pre: 1.93 L/sec
FEF2575-%Pred-Pre: 89 %
FEV1-%Pred-Pre: 58 %
FEV1-Pre: 1.2 L
FEV1FVC-%Pred-Pre: 112 %
FEV6-%Pred-Pre: 52 %
FEV6-Pre: 1.31 L
FEV6FVC-%Pred-Pre: 103 %
FVC-%Pred-Pre: 51 %
FVC-Pre: 1.32 L
Pre FEV1/FVC ratio: 91 %
Pre FEV6/FVC Ratio: 100 %
RV % pred: 77 %
RV: 1.37 L
TLC % pred: 58 %
TLC: 2.78 L

## 2019-07-24 NOTE — Patient Instructions (Addendum)
We will start you on a medication called Ofev which slows down the scarring of the lung We will refer you to pharmacy to review medication and help with management Continue to Imuran as directed by rheumatologist We will schedule a 6-minute walk test and follow-up in 1 month

## 2019-07-24 NOTE — Progress Notes (Signed)
ELLASYN SWILLING    [id number]    10-12-1965  Primary Care Physician:Boone, Levada Dy, NP  Referring Physician: [name], NP [place],  [place] [id number]  Chief complaint: Follow-up for CTD related interstitial lung disease,  HPI: 54 year old with history of systemic lupus erythematosus, NSIP ILD Previously followed by Dr. Lake Bells and is here to reestablish care.  She follows with Dr. Estanislado Pandy and is on Imuran and Plaquenil. Positive ANA, DS DNA, Ro, Smith, RNP,elevated ESR, malar rash, photosensitivity, neutropenia, hypocomplementemia: Started on Imuran at 50 mg daily on December 2019.This was increased to 50 mg twice daily in November 2020 due to elevated sed rate.  States that breathing is stable with chronic cough, no fevers, chills  Outpatient Encounter Medications as of 07/24/2019  Medication Sig  . acetaminophen (TYLENOL) 500 MG tablet Take 500-1,000 mg by mouth every 6 (six) hours as needed for moderate pain.   Marland Kitchen azaTHIOprine (IMURAN) 50 MG tablet Take 50 mg by mouth 2 (two) times daily.  . ferrous sulfate 324 MG TBEC Take 324 mg by mouth daily with breakfast.  . hydroxychloroquine (PLAQUENIL) 200 MG tablet TAKE 1 TABLET BY MOUTH TWICE A DAY  . loratadine (CLARITIN) 10 MG tablet Take 10 mg by mouth daily.  . Multiple Vitamin (MULTIVITAMIN WITH MINERALS) TABS tablet Take 1 tablet by mouth daily.  . Omega-3 Fatty Acids (FISH OIL) 1000 MG CAPS Take 1 capsule by mouth daily.  . pantoprazole (PROTONIX) 40 MG tablet Take 40 mg by mouth every morning.  . Vitamin D, Ergocalciferol, (DRISDOL) 50000 units CAPS capsule Take 50,000 Units by mouth every 7 (seven) days.   No facility-administered encounter medications on file as of 07/24/2019.   Physical Exam: Blood pressure 130/66, pulse (!) 110, temperature 97.9 F (36.6 C), temperature source Temporal, height _0  (1.676 m), weight 135 lb (61.2 kg), last menstrual period 07/01/2013, SpO2 100 %. Gen:      No acute  distress HEENT:  EOMI, sclera anicteric Neck:     No masses; no thyromegaly Lungs:    Clear to auscultation bilaterally; normal respiratory effort CV:         Regular rate and rhythm; no murmur Abd:      + bowel sounds; soft, non-tender; no palpable masses, no distension Ext:    No edema; adequate peripheral perfusion Skin:      Warm and dry; no rash Neuro: alert and oriented x 3 Psych: normal mood and affect  Data Reviewed: Imaging: CT high-resolution 08/05/2016-basilar subpleural reticulation, traction bronchiectasis, paraseptal emphysema. CT high-resolution 06/21/2019-progression of advanced fibrotic lung disease.  Consistent with UIP.  Dilated pulmonary artery suggesting chronic pulmonary arterial hypertension.   PFTs: 07/24/2019 FVC 1.32 [51%], FEV1 1.20 [58%], F/F 91, TLC 2.78 [58%], DLCO 8.38 [43%] Severe restriction, diffusion defect Worsening FVC compared to 2018, lung volumes and diffusion capacity have remained stable.  Labs: ANA 12/07/2017-speckled > 1:1280  Assessment:  CTD related interstitial lung disease, lupus CT scan reviewed with progressive fibrosis in UIP pattern.  Although symptomatically she is doing okay she will need additional treatment for progressive interstitial lung disease We will start her on an antifibrotic Ofev with close monitoring  She was recently been increased on Imuran by Dr. Estanislado Pandy, rheumatologist. Madaline Brilliant to continue on it for now. If there is continued progression then we can consider changing to CellCept.  Suspicion for pulmonary hypertension CT shows dilated PA.  Will order echocardiogram for further evaluation  Plan/Recommendations: -  Start Ofev - 6-minute walk test - Echocardiogram - Follow-up in 1 month  This appointment required 45 minutes of patient care (this includes precharting, chart review, review of results, face-to-face care, etc.).  Marshell Garfinkel MD  Pulmonary and Critical Care 07/24/2019, 12:08 PM  CC: [name], NP

## 2019-07-24 NOTE — Progress Notes (Signed)
Full PFT performed today.

## 2019-07-25 ENCOUNTER — Telehealth: Payer: Self-pay | Admitting: Rheumatology

## 2019-07-25 NOTE — Progress Notes (Signed)
Virtual Visit via Telephone Note  I connected with Kari Henson on 08/02/19 at  9:00 AM EST by telephone and verified that I am speaking with the correct person using two identifiers.  Location: Patient: Home Provider: Clinic  This service was conducted via virtual visit.  The patient was located at home. I was located in my office.  Consent was obtained prior to the virtual visit and is aware of possible charges through their insurance for this visit.  The patient is an established patient.  Dr. Estanislado Pandy, MD conducted the virtual visit and [name], PA-C acted as scribe during the service.  Office staff helped with scheduling follow up visits after the service was conducted.   I discussed the limitations, risks, security and privacy concerns of performing an evaluation and management service by telephone and the availability of in person appointments. I also discussed with the patient that there may be a patient responsible charge related to this service. The patient expressed understanding and agreed to proceed.  CC: Medication monitoring  History of Present Illness: Patient is a 54 year old female with a past medical history of systemic lupus erythematosus, discoid lupus, and ILD.  She is taking Imuran 50 mg 2 times daily and plaquenil 200 mg 1 tablet BID.  She denies any signs or symptoms of a lupus flare.  She denies any new discoid lesions. She has residual scarring.  She denies any oral or nasal ulcerations or sicca symptoms. She denies any symptoms of raynaud's. She denies any joint pain or joint swelling at this time.   She states at her appointment with Dr. [name] on 07/24/19 he discussed adding on Ofev due to noticing progression in ILD.   Review of Systems  Constitutional: Negative for fever and malaise/fatigue.  HENT: Negative for congestion.   Eyes: Negative for photophobia, pain, discharge and redness.  Respiratory: Negative for cough, shortness of breath and wheezing.    Cardiovascular: Negative for chest pain, palpitations and leg swelling.  Gastrointestinal: Negative for blood in stool, constipation and diarrhea.  Genitourinary: Negative for dysuria and frequency.  Musculoskeletal: Negative for back pain, joint pain, myalgias and neck pain.  Skin: Negative for rash.  Neurological: Negative for dizziness, weakness and headaches.  Endo/Heme/Allergies: Does not bruise/bleed easily.  Psychiatric/Behavioral: Negative for depression and memory loss. The patient is not nervous/anxious and does not have insomnia.       Observations/Objective: Physical Exam  Constitutional: She is oriented to person, place, and time.  Neurological: She is alert and oriented to person, place, and time.  Psychiatric: Mood, memory, affect and judgment normal.   Patient reports morning stiffness for 0 NONE.   Patient denies nocturnal pain.  Difficulty dressing/grooming: Denies Difficulty climbing stairs: Denies Difficulty getting out of chair: Denies Difficulty using hands for taps, buttons, cutlery, and/or writing: Denies    Assessment and Plan: Visit Diagnoses:Other systemic lupus erythematosus with other organ involvement ([place])- Positive ANA, DS DNA, Ro, Smith, RNP,elevated ESR, malar rash, photosensitivity, neutropenia, hypocomplementemia:She has not had any signs or symptoms of a systemic lupus flare.  She is clinically doing well on Imuran 100 mg po daily and Plaquenil 200 mg 1 tablet BID.  She has not had any recent rashes and no new discoid lesions.  She denies any oral or nasal ulcerations, sicca symptoms, enlarged lymph nodes, raynaud's, joint pain or joint swelling, chest pain or palpitations.  Lab work from 06/16/19 was reviewed with the patient. ESR trended up from 51 to 95 but  during that time she was taking antibiotics for a URI.  We will recheck CBC, CMP, ESR, and complements.  Orders released today. She will continue taking Imuran and PLQ as prescribed.  She had  a recent appointment with Dr. [name] to review recent high resolution chest CT results, which revealed further progression in ILD despite no new or worsening pulmonary symptoms.  She will starting on Ofev with close monitoring under the care of Dr. [name].  She was advised to notify us if she develops signs or symptoms of a flare.  She will follow up in 2 months.   High risk medication use-Imuran 100 mg daily and Plaquenil 200 mg 1 tablet twice daily.(She started on Imuran in December 2019).  Last Plaquenil eye exam normal on 07/08/2018. She is due to update eye exam.  CBC and CMP will be drawn today.   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   Lung fibrosis ([place]) -She had an appointment with Dr. [name] on 07/24/19.  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  He discussed starting her on an anti-fibrotic Ofev with close monitoring due to the progression in ILD. If she continues to have progression Cellcept will be a treatment option in the future.    ILD (interstitial lung disease) ([place]) -She has not had any new or worsening pulmonary symptoms. Recent CT scan revealed further progression in ILD, and she will be starting on Ofev under the care of Dr. [name].   Clubbing of fingers-Stable  Other fatigue -Stable   Other medical conditions are listed as follows:   Vitamin D deficiency -Vitamin D was 78.4 on 10/05/18.  History of bone density study: Normal in 2018  MGUS (monoclonal gammopathy of unknown significance  Follow Up Instructions: She will follow up in 2 months   I discussed the assessment and treatment plan with the patient. The patient was provided an opportunity to ask questions and all were answered. The patient agreed with the plan and demonstrated an understanding of the instructions.   The patient was advised to call back or seek an in-person evaluation if the symptoms worsen or if the condition  fails to improve as anticipated.  I provided 30 minutes of non-face-to-face time during this encounter.   Bo Merino, MD   Scribed byLovena Le Dale,PA-C

## 2019-07-25 NOTE — Telephone Encounter (Signed)
Patient's PCP has ordered bone density and mammogram in the past in [place]. Patient will have PCP order both. Patient will cancel appt at [facility] so she can stay on the same machine for her bone density.

## 2019-07-25 NOTE — Telephone Encounter (Signed)
Patient called stating she scheduled her bone density scan and mammography at [facility] on Monday, 07/31/2019.  Patient states [facility] needs Dr. Estanislado Pandy to send the orders to their office.

## 2019-07-31 ENCOUNTER — Encounter (INDEPENDENT_AMBULATORY_CARE_PROVIDER_SITE_OTHER): Payer: Medicare Other | Admitting: Ophthalmology

## 2019-07-31 ENCOUNTER — Telehealth: Payer: Self-pay | Admitting: Pharmacist

## 2019-07-31 NOTE — Progress Notes (Signed)
Subjective:  Patient presents today to [facility] to see pharmacy team for Ofev new start.  Pertinent past medical history includes SLE, ILD with progressive phenotype. She is followed by Dr. Estanislado Pandy at [place] for SLE and is taking Imuran 50 mg twice daily and Plaquenil 200 mg twice daily.  Objective: No Known Allergies  Outpatient Encounter Medications as of 08/02/2019  Medication Sig  . acetaminophen (TYLENOL) 500 MG tablet Take 500-1,000 mg by mouth every 6 (six) hours as needed for moderate pain.   Marland Kitchen azaTHIOprine (IMURAN) 50 MG tablet Take 50 mg by mouth 2 (two) times daily.  . ferrous sulfate 324 MG TBEC Take 324 mg by mouth daily with breakfast.  . hydroxychloroquine (PLAQUENIL) 200 MG tablet TAKE 1 TABLET BY MOUTH TWICE A DAY  . loratadine (CLARITIN) 10 MG tablet Take 10 mg by mouth daily.  . Multiple Vitamin (MULTIVITAMIN WITH MINERALS) TABS tablet Take 1 tablet by mouth daily.  . Omega-3 Fatty Acids (FISH OIL) 1000 MG CAPS Take 1 capsule by mouth daily.  . pantoprazole (PROTONIX) 40 MG tablet Take 40 mg by mouth every morning.  . Vitamin D, Ergocalciferol, (DRISDOL) 50000 units CAPS capsule Take 50,000 Units by mouth every 7 (seven) days.   No facility-administered encounter medications on file as of 08/02/2019.     Immunization History  Administered Date(s) Administered  . Influenza Split 03/24/2016  . Influenza,inj,Quad PF,6+ Mos 04/15/2019  . Influenza-Unspecified 04/19/2017  . Pneumococcal Polysaccharide-23 07/24/2013, 07/29/2018     HRCT 08/05/2016-basilar subpleural reticulation, traction bronchiectasis, paraseptal emphysema. 06/21/2019-progression of advanced fibrotic lung disease.  Consistent with UIP.  Dilated pulmonary artery suggesting chronic pulmonary arterial hypertension.  PFT's 07/24/2019 FVC 1.32 [51%], FEV1 1.20 [58%], F/F 91, TLC 2.78 [58%], DLCO 8.38 [43%] Severe restriction, diffusion defect Worsening FVC compared to 2018,  lung volumes and diffusion capacity have remained stable.   CMP     Component Value Date/Time   NA 140 06/16/2019 1430   K 3.7 06/16/2019 1430   CL 98 06/16/2019 1430   CO2 25 06/16/2019 1430   GLUCOSE 90 06/16/2019 1430   GLUCOSE 89 04/28/2019 1331   BUN 4 (L) 06/16/2019 1430   CREATININE 0.47 (L) 06/16/2019 1430   CREATININE 0.54 04/28/2019 1331   CALCIUM 9.9 06/16/2019 1430   PROT 7.3 06/16/2019 1430   ALBUMIN 4.2 06/16/2019 1430   AST 25 06/16/2019 1430   AST 29 03/14/2014 0000   ALT 10 06/16/2019 1430   ALT 23 02/19/2014 0000   ALKPHOS 102 06/16/2019 1430   ALKPHOS 114 03/14/2014 0000   BILITOT 0.3 06/16/2019 1430   BILITOT 0.4 03/14/2014 0000   GFRNONAA 112 06/16/2019 1430   GFRNONAA 107 04/28/2019 1331   GFRAA 130 06/16/2019 1430   GFRAA 124 04/28/2019 1331     CBC    Component Value Date/Time   WBC 4.3 06/16/2019 1430   WBC 6.2 04/28/2019 1331   RBC 4.78 06/16/2019 1430   RBC 4.59 04/28/2019 1331   HGB 12.5 06/16/2019 1430   HCT 39.4 06/16/2019 1430   PLT 319 06/16/2019 1430   MCV 82 06/16/2019 1430   MCH 26.2 (L) 06/16/2019 1430   MCH 26.1 (L) 04/28/2019 1331   MCHC 31.7 06/16/2019 1430   MCHC 32.0 04/28/2019 1331   RDW 13.3 06/16/2019 1430   LYMPHSABS 1.1 06/16/2019 1430   MONOABS 0.6 02/14/2014 2039   EOSABS 0.7 (H) 06/16/2019 1430   BASOSABS 0.1 06/16/2019 1430     LFT's Hepatic  Function Latest Ref Rng & Units 06/16/2019 04/28/2019 12/27/2018  Total Protein 6.0 - 8.5 g/dL 7.3 6.8 7.4  Albumin 3.8 - 4.9 g/dL 4.2 - -  AST 0 - 40 IU/L _0 ALT 0 - 32 IU/L _1 Alk Phosphatase 39 - 117 IU/L 102 - -  Total Bilirubin 0.0 - 1.2 mg/dL 0.3 0.5 0.3     Assessment and Plan  1. Ofev Medication Management  Patient counseled on purpose, proper use, and potential adverse effects including diarrhea, nausea, vomiting, abdominal pain, decreased appetite, weight loss, and increased blood pressure.Stressed importance of lab monitoring.  Will monitor  LFT's every month for the first 6 months of treatment and then every 3 months.  Ofev dose will be 150 mg capsule every 12 hours with food. Stressed importance of taking with food to minimize stomach upset.  Patient does not have prescription insurance.  She has Medicare part a and part B only.  Patient filled out BI cares patient assistance application in office.  She will fax her income documents to the office and we will submit for Ofev approval.  We will update patient when we receive a response.  2. Medication Reconciliation  A drug regimen assessment was performed, including review of allergies, interactions, disease-state management, dosing and immunization history. Medications were reviewed with the patient, including name, instructions, indication, goals of therapy, potential side effects, importance of adherence, and safe use.  Drug interaction(s): concomitant use of ferrous sulfate and pantoprazole can result in decreased iron bioavailability.  Recommend taking ferrous sulfate in the evening not in combination with pantoprazole in the morning.  Recommend monitoring CBC and iron levels.  Patient states she will was prescribed ferrous sulfate by her PCP and had lab work yesterday to monitor her anemia.  3. Immunizations  She is up to date with her annual influenza and pneumonia vaccines.  Recommend Shingrix and Covid-19 vaccines.  Advised patient that she does not have to stop Imuran to receive the Shingrix and COVID-19 vaccines.  All questions encouraged and answered.  Instructed patient to reach out with any further questions or concerns.    Thank you for allowing pharmacy to be involved in this patient's care.  This appointment required  30 minutes of patient care (this includes precharting, chart review, review of results, face-to-face care, etc.).  Mariella Saa, PharmD, [place], [place] Clinical Specialty Pharmacist [phone]  08/02/2019 10:39 AM

## 2019-07-31 NOTE — Telephone Encounter (Signed)
Patient does not have any prescription insurance, she uses discount cards at her pharmacy. She will bring Ofev new start paperwork to office on Wednesday.  11:38 AM Beatriz Chancellor, CPhT

## 2019-07-31 NOTE — Telephone Encounter (Signed)
Please start BIV for Ofev.  We do not have any new start paperwork for patient. Diagnosis: J84.170 Interstitial lung disease with a progressive fibrotic phenotype in diseases classified elsewhere.  She has an appointment with pharmacy on 2/3 and can obtain information for patient assistance if needed then.  Mariella Saa, PharmD, [place], [place] Clinical Specialty Pharmacist [phone]  07/31/2019 8:49 AM

## 2019-08-01 ENCOUNTER — Telehealth: Payer: Self-pay | Admitting: *Deleted

## 2019-08-01 NOTE — Telephone Encounter (Signed)
Received DEXA results from [facility].  Date of Scan: 04/01/17 Lowest T-score and site measured: -0.5 dual Femur Significant changes in BMD and site measured (5% and above): n/a  Current Regimen: n/a  Recommendation: Repeat Dexa in October 2023  Patient advised.

## 2019-08-02 ENCOUNTER — Other Ambulatory Visit: Payer: Self-pay

## 2019-08-02 ENCOUNTER — Encounter: Payer: Self-pay | Admitting: Rheumatology

## 2019-08-02 ENCOUNTER — Telehealth (INDEPENDENT_AMBULATORY_CARE_PROVIDER_SITE_OTHER): Payer: Medicare Other | Admitting: Rheumatology

## 2019-08-02 ENCOUNTER — Ambulatory Visit (INDEPENDENT_AMBULATORY_CARE_PROVIDER_SITE_OTHER): Payer: Medicare Other | Admitting: Pharmacist

## 2019-08-02 DIAGNOSIS — Z79899 Other long term (current) drug therapy: Secondary | ICD-10-CM | POA: Diagnosis not present

## 2019-08-02 DIAGNOSIS — J849 Interstitial pulmonary disease, unspecified: Secondary | ICD-10-CM

## 2019-08-02 DIAGNOSIS — J8410 Pulmonary fibrosis, unspecified: Secondary | ICD-10-CM

## 2019-08-02 DIAGNOSIS — M3219 Other organ or system involvement in systemic lupus erythematosus: Secondary | ICD-10-CM | POA: Diagnosis not present

## 2019-08-02 DIAGNOSIS — E559 Vitamin D deficiency, unspecified: Secondary | ICD-10-CM | POA: Diagnosis not present

## 2019-08-02 DIAGNOSIS — R683 Clubbing of fingers: Secondary | ICD-10-CM

## 2019-08-02 DIAGNOSIS — L930 Discoid lupus erythematosus: Secondary | ICD-10-CM | POA: Diagnosis not present

## 2019-08-02 DIAGNOSIS — R5383 Other fatigue: Secondary | ICD-10-CM

## 2019-08-02 DIAGNOSIS — D472 Monoclonal gammopathy: Secondary | ICD-10-CM | POA: Diagnosis not present

## 2019-08-02 DIAGNOSIS — Z87891 Personal history of nicotine dependence: Secondary | ICD-10-CM

## 2019-08-02 NOTE — Addendum Note (Signed)
Addended by: Mariella Saa C on: 08/02/2019 10:51 AM   Modules accepted: Orders

## 2019-08-03 ENCOUNTER — Other Ambulatory Visit: Payer: Self-pay

## 2019-08-03 DIAGNOSIS — Z79899 Other long term (current) drug therapy: Secondary | ICD-10-CM

## 2019-08-03 DIAGNOSIS — M3219 Other organ or system involvement in systemic lupus erythematosus: Secondary | ICD-10-CM

## 2019-08-03 NOTE — Progress Notes (Signed)
Lab orders released for [facility], per patient's request.

## 2019-08-04 LAB — CBC WITH DIFFERENTIAL/PLATELET
Basophils Absolute: 0.1 10*3/uL (ref 0.0–0.2)
Basos: 2 %
EOS (ABSOLUTE): 0.6 10*3/uL — ABNORMAL HIGH (ref 0.0–0.4)
Eos: 16 %
Hematocrit: 40.8 % (ref 34.0–46.6)
Hemoglobin: 12.8 g/dL (ref 11.1–15.9)
Immature Grans (Abs): 0 10*3/uL (ref 0.0–0.1)
Immature Granulocytes: 0 %
Lymphocytes Absolute: 0.7 10*3/uL (ref 0.7–3.1)
Lymphs: 19 %
MCH: 26.5 pg — ABNORMAL LOW (ref 26.6–33.0)
MCHC: 31.4 g/dL — ABNORMAL LOW (ref 31.5–35.7)
MCV: 85 fL (ref 79–97)
Monocytes Absolute: 0.8 10*3/uL (ref 0.1–0.9)
Monocytes: 21 %
Neutrophils Absolute: 1.6 10*3/uL (ref 1.4–7.0)
Neutrophils: 42 %
Platelets: 280 10*3/uL (ref 150–450)
RBC: 4.83 x10E6/uL (ref 3.77–5.28)
RDW: 13.3 % (ref 11.7–15.4)
WBC: 3.9 10*3/uL (ref 3.4–10.8)

## 2019-08-04 LAB — CMP14+EGFR
ALT: 8 IU/L (ref 0–32)
AST: 20 IU/L (ref 0–40)
Albumin/Globulin Ratio: 1.4 (ref 1.2–2.2)
Albumin: 4.3 g/dL (ref 3.8–4.9)
Alkaline Phosphatase: 96 IU/L (ref 39–117)
BUN/Creatinine Ratio: 10 (ref 9–23)
BUN: 6 mg/dL (ref 6–24)
Bilirubin Total: 0.4 mg/dL (ref 0.0–1.2)
CO2: 25 mmol/L (ref 20–29)
Calcium: 10.3 mg/dL — ABNORMAL HIGH (ref 8.7–10.2)
Chloride: 99 mmol/L (ref 96–106)
Creatinine, Ser: 0.61 mg/dL (ref 0.57–1.00)
GFR calc Af Amer: 119 mL/min/{1.73_m2} (ref >59)
GFR calc non Af Amer: 103 mL/min/{1.73_m2} (ref >59)
Globulin, Total: 3.1 g/dL (ref 1.5–4.5)
Glucose: 80 mg/dL (ref 65–99)
Potassium: 4.1 mmol/L (ref 3.5–5.2)
Sodium: 139 mmol/L (ref 134–144)
Total Protein: 7.4 g/dL (ref 6.0–8.5)

## 2019-08-04 LAB — C3 AND C4
Complement C3, Serum: 148 mg/dL (ref 82–167)
Complement C4, Serum: 18 mg/dL (ref 12–38)

## 2019-08-04 LAB — SEDIMENTATION RATE: Sed Rate: 86 mm/hr — ABNORMAL HIGH (ref 0–40)

## 2019-08-04 NOTE — Progress Notes (Signed)
CBC is normal.  Calcium is mildly elevated which we will continue to monitor.  Sedimentation rate has improved.  Complements are normal.  Patient should continue current treatment.

## 2019-08-08 DIAGNOSIS — Z0000 Encounter for general adult medical examination without abnormal findings: Secondary | ICD-10-CM | POA: Diagnosis not present

## 2019-08-08 DIAGNOSIS — Z1331 Encounter for screening for depression: Secondary | ICD-10-CM | POA: Diagnosis not present

## 2019-08-08 DIAGNOSIS — Z01419 Encounter for gynecological examination (general) (routine) without abnormal findings: Secondary | ICD-10-CM | POA: Diagnosis not present

## 2019-08-08 DIAGNOSIS — Z1339 Encounter for screening examination for other mental health and behavioral disorders: Secondary | ICD-10-CM | POA: Diagnosis not present

## 2019-08-08 DIAGNOSIS — Z299 Encounter for prophylactic measures, unspecified: Secondary | ICD-10-CM | POA: Diagnosis not present

## 2019-08-08 DIAGNOSIS — Z79899 Other long term (current) drug therapy: Secondary | ICD-10-CM | POA: Diagnosis not present

## 2019-08-08 DIAGNOSIS — Z6825 Body mass index (BMI) 25.0-25.9, adult: Secondary | ICD-10-CM | POA: Diagnosis not present

## 2019-08-08 DIAGNOSIS — E785 Hyperlipidemia, unspecified: Secondary | ICD-10-CM | POA: Diagnosis not present

## 2019-08-08 DIAGNOSIS — Z7189 Other specified counseling: Secondary | ICD-10-CM | POA: Diagnosis not present

## 2019-08-08 DIAGNOSIS — Z1211 Encounter for screening for malignant neoplasm of colon: Secondary | ICD-10-CM | POA: Diagnosis not present

## 2019-08-08 DIAGNOSIS — J449 Chronic obstructive pulmonary disease, unspecified: Secondary | ICD-10-CM | POA: Diagnosis not present

## 2019-08-10 NOTE — Telephone Encounter (Signed)
Patient filled out BI Cares application at her last appointment.  Application pending provider portion.  Mariella Saa, PharmD, [place], [place] Clinical Specialty Pharmacist [phone]  08/10/2019 8:49 AM

## 2019-08-21 NOTE — Telephone Encounter (Signed)
Submitted Patient Assistance Application to [facility] for [place] along with provider portion and income documents. Will follow up to confirm receipt. Will update patient when we receive a response.  Fax# [phone] Phone# [phone]  12:03 PM Beatriz Chancellor, CPhT

## 2019-08-23 NOTE — Telephone Encounter (Signed)
Received a fax from  [facility] regarding an approval for [place] from 08/23/19 to 06/28/20. Rep states they attempted to call patient today to schedule shipment, but was unable to reach patient. Called patient and provided phone number to call to schedule.  Phone number: [phone]  12:09 PM Beatriz Chancellor, CPhT

## 2019-08-24 ENCOUNTER — Other Ambulatory Visit: Payer: Self-pay

## 2019-08-24 ENCOUNTER — Ambulatory Visit (INDEPENDENT_AMBULATORY_CARE_PROVIDER_SITE_OTHER): Payer: Medicare Other | Admitting: Pulmonary Disease

## 2019-08-24 ENCOUNTER — Ambulatory Visit: Payer: Medicare Other

## 2019-08-24 ENCOUNTER — Encounter: Payer: Self-pay | Admitting: Pulmonary Disease

## 2019-08-24 VITALS — BP 116/84 | HR 100 | Temp 98.6°F | Ht 66.0 in | Wt 137.0 lb

## 2019-08-24 DIAGNOSIS — R0602 Shortness of breath: Secondary | ICD-10-CM

## 2019-08-24 DIAGNOSIS — J849 Interstitial pulmonary disease, unspecified: Secondary | ICD-10-CM | POA: Diagnosis not present

## 2019-08-24 NOTE — Progress Notes (Signed)
TRENACE COUGHLIN    [id number]    1966-02-27  Primary Care Physician:Boone, Levada Dy, NP  Referring Physician: [name], NP [place],  [place] [address]  Chief complaint: Follow-up for CTD related interstitial lung disease,  HPI: 54 year old with history of systemic lupus erythematosus, NSIP ILD Previously followed by Dr. [place] Bells and is here to reestablish care.  She follows with Dr. Estanislado Pandy and is on Imuran and Plaquenil. Positive ANA, DS DNA, Ro, Smith, RNP,elevated ESR, malar rash, photosensitivity, neutropenia, hypocomplementemia: Started on Imuran at 50 mg daily on December 2019.This was increased to 50 mg twice daily in November 2020 due to elevated sed rate.  States that breathing is stable with chronic cough, no fevers, chills  Interim history: Continues to do well with regard to the breathing with no complaints She has been approved for Ofev and is due to start tomorrow  Outpatient Encounter Medications as of 08/24/2019  Medication Sig  . acetaminophen (TYLENOL) 500 MG tablet Take 500-1,000 mg by mouth every 6 (six) hours as needed for moderate pain.   Marland Kitchen azaTHIOprine (IMURAN) 50 MG tablet Take 50 mg by mouth 2 (two) times daily.  . ferrous sulfate 324 MG TBEC Take 324 mg by mouth daily with breakfast.  . hydroxychloroquine (PLAQUENIL) 200 MG tablet TAKE 1 TABLET BY MOUTH TWICE A DAY  . loratadine (CLARITIN) 10 MG tablet Take 10 mg by mouth daily.  . Multiple Vitamin (MULTIVITAMIN WITH MINERALS) TABS tablet Take 1 tablet by mouth daily.  . Omega-3 Fatty Acids (FISH OIL) 1000 MG CAPS Take 1 capsule by mouth daily.  . pantoprazole (PROTONIX) 40 MG tablet Take 40 mg by mouth every morning.  . Vitamin D, Ergocalciferol, (DRISDOL) 50000 units CAPS capsule Take 50,000 Units by mouth every 7 (seven) days.   No facility-administered encounter medications on file as of 08/24/2019.   Physical Exam: Blood pressure 116/84, pulse 100, temperature 98.6 F (37 C),  temperature source Temporal, height _0  (1.676 m), weight 137 lb (62.1 kg), last menstrual period 07/01/2013, SpO2 99 %. Gen:      No acute distress HEENT:  EOMI, sclera anicteric Neck:     No masses; no thyromegaly Lungs:    Clear to auscultation bilaterally; normal respiratory effort CV:         Regular rate and rhythm; no murmurs Abd:      + bowel sounds; soft, non-tender; no palpable masses, no distension Ext:    No edema; adequate peripheral perfusion Skin:      Warm and dry; no rash Neuro: alert and oriented x 3 Psych: normal mood and affect  Data Reviewed: Imaging: CT high-resolution 08/05/2016-basilar subpleural reticulation, traction bronchiectasis, paraseptal emphysema. CT high-resolution 06/21/2019-progression of advanced fibrotic lung disease.  Consistent with UIP.  Dilated pulmonary artery suggesting chronic pulmonary arterial hypertension.   PFTs: 07/24/2019 FVC 1.32 [51%], FEV1 1.20 [58%], F/F 91, TLC 2.78 [58%], DLCO 8.38 [43%] Severe restriction, diffusion defect Worsening FVC compared to 2018, lung volumes and diffusion capacity have remained stable.  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  Although symptomatically she is doing okay she will need additional treatment for progressive interstitial lung disease Ofev has been approved and she is due to start it tomorrow.  Recent LFTs are normal She was recently been increased on Imuran  by Dr. Estanislado Pandy, rheumatologist. Madaline Brilliant to continue on it for now. If there is continued progression then we can consider changing to CellCept.  Suspicion for pulmonary hypertension CT shows dilated PA.  Will order echocardiogram for further evaluation  Plan/Recommendations: - Start Ofev - Echocardiogram - Follow-up in 1 month  This appointment required 45 minutes  of patient care (this includes precharting, chart review, review of results, face-to-face care, etc.).  Marshell Garfinkel MD [facility] 07/24/2019, 12:08 PM  CC: [name], NP

## 2019-08-24 NOTE — Patient Instructions (Signed)
You are about to start Ofev tomorrow We will check back with you in 1 month for monitoring We will schedule echocardiogram to evaluate your heart function Continue the imuran directed by Dr. Estanislado Pandy  Follow-up in 1 month

## 2019-08-30 ENCOUNTER — Encounter (INDEPENDENT_AMBULATORY_CARE_PROVIDER_SITE_OTHER): Payer: Medicare Other | Admitting: Ophthalmology

## 2019-09-18 ENCOUNTER — Telehealth: Payer: Self-pay | Admitting: Rheumatology

## 2019-09-18 ENCOUNTER — Other Ambulatory Visit: Payer: Self-pay | Admitting: Rheumatology

## 2019-09-18 MED ORDER — HYDROXYCHLOROQUINE SULFATE 200 MG PO TABS: 200.0000 mg | ORAL_TABLET | Freq: Two times a day (BID) | ORAL | 0 refills | Status: DC

## 2019-09-18 NOTE — Telephone Encounter (Signed)
Last Visit: 08/02/19 Next Visit: 10/03/19 Labs: 08/03/19 CBC is normal. Calcium is mildly elevated  PLQ Eye Exam: 07/08/18  Current Dose per office note on 08/02/19: Plaquenil 200 mg 1 tablet BID  Patient advised she is due to update PLQ eye exam. Patient to call and schedule and will call back with date.   Okay to refill 30 day supply per Dr. Estanislado Pandy

## 2019-09-18 NOTE — Telephone Encounter (Signed)
Noted.

## 2019-09-18 NOTE — Telephone Encounter (Signed)
FYI: Patient has eye exam scheduled for October 12, 2019.

## 2019-09-18 NOTE — Telephone Encounter (Signed)
Patient called requesting prescription refill of Plaquenil to be sent to [facility] at [address] in [place].

## 2019-09-21 ENCOUNTER — Ambulatory Visit (HOSPITAL_COMMUNITY)
Admission: RE | Admit: 2019-09-21 | Discharge: 2019-09-21 | Disposition: A | Discharge status: Home / Self Care | Payer: Medicare Other | Source: Ambulatory Visit | Attending: Pulmonary Disease | Admitting: Pulmonary Disease

## 2019-09-21 ENCOUNTER — Other Ambulatory Visit: Payer: Self-pay

## 2019-09-21 DIAGNOSIS — R0602 Shortness of breath: Secondary | ICD-10-CM

## 2019-09-21 NOTE — Progress Notes (Signed)
*  PRELIMINARY RESULTS* Echocardiogram 2D Echocardiogram has been performed.  Kari Henson 09/21/2019, 9:16 AM

## 2019-09-22 ENCOUNTER — Ambulatory Visit (INDEPENDENT_AMBULATORY_CARE_PROVIDER_SITE_OTHER): Payer: Medicare Other | Admitting: Pulmonary Disease

## 2019-09-22 ENCOUNTER — Other Ambulatory Visit: Payer: Self-pay

## 2019-09-22 ENCOUNTER — Encounter: Payer: Self-pay | Admitting: Pulmonary Disease

## 2019-09-22 DIAGNOSIS — I272 Pulmonary hypertension, unspecified: Secondary | ICD-10-CM | POA: Diagnosis not present

## 2019-09-22 DIAGNOSIS — R0602 Shortness of breath: Secondary | ICD-10-CM

## 2019-09-22 DIAGNOSIS — J849 Interstitial pulmonary disease, unspecified: Secondary | ICD-10-CM

## 2019-09-22 MED ORDER — FUROSEMIDE 20 MG PO TABS: 20.0000 mg | ORAL_TABLET | Freq: Every day | ORAL | 1 refills | Status: DC

## 2019-09-22 NOTE — Progress Notes (Signed)
Virtual Visit via Telephone Note  I connected with Kari Henson on 09/22/19 at  9:45 AM EDT by telephone and verified that I am speaking with the correct person using two identifiers.  Location: Patient: Home Provider: [facility], [place], [place]   I discussed the limitations, risks, security and privacy concerns of performing an evaluation and management service by telephone and the availability of in person appointments. I also discussed with the patient that there may be a patient responsible charge related to this service. The patient expressed understanding and agreed to proceed.   History of Present Illness: Follow-up for CTD ILD, Progressive UIP fibrosis  54 year old with history of systemic lupus erythematosus,NSIPILD. She follows with Dr. Estanislado Pandy and is on Imuran and Plaquenil. Positive ANA, DS DNA, Ro, Smith, RNP,elevated ESR, malar rash, photosensitivity, neutropenia, hypocomplementemia: Started on Imuran at 50 mg daily on December 2019.This was increased to 50 mg twice daily in November 2020 due to elevated sed rate.  States that breathing is stable with chronic cough, no fevers, chills   Observations/Objective: Started on ofev 07/24/19. Has mild stomach upset but is able to tolerate it well. Has noticed ankle swelling  Dyspnea is stable  Echo 09/21/19-  LVEF 59-53%, grade 1 diastolic dysfunction, mildly elevated PA systolic pressure  Assessment and Plan: CTD ILD, UIP fibrosis Continue Imuran per Dr. Estanislado Pandy, rheumatology Started on Ofev Check hepatic panel  Pulm HTN Echo reviewed with mild pulmonary hypertension Has lower extremity edema Start Lasix 20 mg a day Referred to cardiology for evaluation and possible right heart cath  Follow Up Instructions: Lasix 20 mg a day CMP, CBC, BNP Continue Ofev, Imuran Cardiology referral   I discussed the assessment and treatment plan with the patient. The patient was provided an opportunity  to ask questions and all were answered. The patient agreed with the plan and demonstrated an understanding of the instructions.   The patient was advised to call back or seek an in-person evaluation if the symptoms worsen or if the condition fails to improve as anticipated.  I provided 25 minutes of non-face-to-face time during this encounter.   Marshell Garfinkel MD [facility] 09/22/2019, 9:38 AM

## 2019-09-22 NOTE — Patient Instructions (Signed)
We will start you on Lasix 20 mg a day Check comprehensive metabolic panel, CBC, BNP Continue the Ofev Referred to cardiology for evaluation of pulmonary hypertension  Follow-up in 1 month.

## 2019-09-28 DIAGNOSIS — Z1231 Encounter for screening mammogram for malignant neoplasm of breast: Secondary | ICD-10-CM | POA: Diagnosis not present

## 2019-09-28 NOTE — Progress Notes (Deleted)
Office Visit Note  Patient: Kari Henson             Date of Birth: 02-Oct-1965           MRN: [id number]             PCP: [name], NP Referring: [name], NP Visit Date: 10/03/2019 Occupation: _0 @  Subjective:  No chief complaint on file.   History of Present Illness: Kari Henson is a 54 y.o. female ***   Activities of Daily Living:  Patient reports morning stiffness for *** {minute/hour:19697}.   Patient {ACTIONS;DENIES/REPORTS:21021675::"Denies"} nocturnal pain.  Difficulty dressing/grooming: {ACTIONS;DENIES/REPORTS:21021675::"Denies"} Difficulty climbing stairs: {ACTIONS;DENIES/REPORTS:21021675::"Denies"} Difficulty getting out of chair: {ACTIONS;DENIES/REPORTS:21021675::"Denies"} Difficulty using hands for taps, buttons, cutlery, and/or writing: {ACTIONS;DENIES/REPORTS:21021675::"Denies"}  No Rheumatology ROS completed.   PMFS History:  Patient Active Problem List   Diagnosis Date Noted  . 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 Vitamin D deficiency 04/30/2017  . High risk medication use 04/30/2017  . History of bone density study 04/30/2017  . Chronic cough 07/24/2016  . ILD (interstitial lung disease) ([place]) 07/24/2016  . Abdominal pain, epigastric 03/29/2014  . Encounter for screening colonoscopy 03/29/2014  . 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 SLE (systemic lupus erythematosus) ([facility])   . Lung fibrosis ([place])     Past Medical History:  Diagnosis Date  . GERD (gastroesophageal reflux disease)   . Lung fibrosis ([place])    follows with a Dr. Ouida Sills, pulmonary in [place], [place]  . 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 Seasonal allergies   . SLE (systemic lupus erythematosus) ([place]) 2010   she was on MTX (d/c due to cytopenia).  She has been on Prednisone and Plaquenil    Family History  Problem Relation Age of Onset  . Asthma Mother   . Thyroid disease Mother     . Hypertension Mother   . Cirrhosis Father   . Alcohol abuse Father   . Diabetes Father   . COPD Father   . Diabetes Maternal Aunt   . Hypertension Sister   . Healthy Brother   . Diabetes Maternal Uncle   . Diabetes Paternal Aunt   . Hypertension Maternal Grandmother   . COPD Maternal Grandmother   . Arthritis Maternal Grandmother        rheumatoid arthritis  . Healthy Son   . Colon cancer Neg Hx    Past Surgical History:  Procedure Laterality Date  . CESAREAN SECTION    . COLONOSCOPY N/A 04/27/2014   SLF:  1. one colon polyp removed 2 The left colonis redundant 3. the examined terminal ileum apperared to be  normal 3. small internal hemorrohoids.  . ESOPHAGOGASTRODUODENOSCOPY N/A 04/27/2014   SLF: 1. No obvious source for abdominal pain identified. 2. Non-erosive gastritis (inflammation) was found in the gastric antrum; multiple biopsies were performed.  Marland Kitchen HERNIA REPAIR    . MULTIPLE TOOTH EXTRACTIONS     Social History   Social History Narrative  . Not on file   Immunization History  Administered Date(s) Administered  . Influenza Split 03/24/2016  . Influenza,inj,Quad PF,6+ Mos 04/15/2019  . Influenza-Unspecified 04/19/2017  . Pneumococcal Polysaccharide-23 07/24/2013, 07/29/2018     Objective: Vital Signs: LMP 07/01/2013    Physical Exam   Musculoskeletal Exam: ***  CDAI Exam: CDAI Score: -- Patient Global: --; Provider Global: -- Swollen: --; Tender: -- Joint Exam 10/03/2019   No joint exam has been documented for this  visit   There is currently no information documented on the homunculus. Go to the Rheumatology activity and complete the homunculus joint exam.  Investigation: No additional findings.  Imaging: ECHOCARDIOGRAM COMPLETE  Result Date: 09/21/2019    ECHOCARDIOGRAM REPORT   Patient Name:   Kari Henson Date of Exam: 09/21/2019 Medical Rec #:  [id number]       Height:       66.0 in Accession #:    [id number]      Weight:       137.0 lb  Date of Birth:  04-08-1966       BSA:          1.703 m Patient Age:    72 years        BP:           148/88 mmHg Patient Gender: F               HR:           105 bpm. Exam Location:  [facility] Procedure: 2D Echo, Cardiac Doppler and Color Doppler Indications:    R06.02 (ICD-10-CM) - Shortness of breath  History:        Patient has no prior history of Echocardiogram examinations.                 COPD; Risk Factors:Former Smoker. ILD (interstitial lung                 disease),Clubbing of fingers,SLE (systemic lupus erythematosus).  Sonographer:    Alvino Chapel RCS Referring Phys: [id number] [place]  1. Left ventricular ejection fraction, by estimation, is 50 to 55%. The left ventricle has low normal function. The left ventricle has no regional wall motion abnormalities. Left ventricular diastolic parameters are consistent with Grade I diastolic dysfunction (impaired relaxation).  2. Right ventricular systolic function is normal. The right ventricular size is normal. There is mildly elevated pulmonary artery systolic pressure.  3. Left atrial size was mildly dilated.  4. The mitral valve is normal in structure. Trivial mitral valve regurgitation. No evidence of mitral stenosis.  5. The aortic valve is tricuspid. Aortic valve regurgitation is not visualized. No aortic stenosis is present.  6. The inferior vena cava is normal in size with greater than 50% respiratory variability, suggesting right atrial pressure of 3 mmHg. FINDINGS  Left Ventricle: Left ventricular ejection fraction, by estimation, is 50 to 55%. The left ventricle has low normal function. The left ventricle has no regional wall motion abnormalities. The left ventricular internal cavity size was normal in size. There is no left ventricular hypertrophy. Left ventricular diastolic parameters are consistent with Grade I diastolic dysfunction (impaired relaxation). Normal left ventricular filling pressure. Right Ventricle: The right  ventricular size is normal. No increase in right ventricular wall thickness. Right ventricular systolic function is normal. There is mildly elevated pulmonary artery systolic pressure. The tricuspid regurgitant velocity is 2.54  m/s, and with an assumed right atrial pressure of 8 mmHg, the estimated right ventricular systolic pressure is 62.8 mmHg. Left Atrium: Left atrial size was mildly dilated. Right Atrium: Right atrial size was normal in size. Pericardium: There is no evidence of pericardial effusion. Mitral Valve: The mitral valve is normal in structure. Trivial mitral valve regurgitation. No evidence of mitral valve stenosis. Tricuspid Valve: The tricuspid valve is normal in structure. Tricuspid valve regurgitation is trivial. No evidence of tricuspid stenosis. Aortic Valve: The aortic valve is tricuspid. Aortic valve regurgitation is not  visualized. No aortic stenosis is present. Aortic valve mean gradient measures 3.6 mmHg. Aortic valve peak gradient measures 6.9 mmHg. Aortic valve area, by VTI measures 2.58 cm. Pulmonic Valve: The pulmonic valve was not well visualized. Pulmonic valve regurgitation is not visualized. No evidence of pulmonic stenosis. Aorta: The aortic root is normal in size and structure. Pulmonary Artery: Indeterminate PASP, inadequate TR jet. Venous: The inferior vena cava is normal in size with greater than 50% respiratory variability, suggesting right atrial pressure of 3 mmHg. IAS/Shunts: No atrial level shunt detected by color flow Doppler.  LEFT VENTRICLE PLAX 2D LVIDd:         4.75 cm     Diastology LVIDs:         3.31 cm     LV e' lateral:   6.20 cm/s LV PW:         0.94 cm     LV E/e' lateral: 10.6 LV IVS:        0.90 cm     LV e' medial:    4.46 cm/s LVOT diam:     2.20 cm     LV E/e' medial:  14.7 LV SV:         57 LV SV Index:   33 LVOT Area:     3.80 cm  LV Volumes (MOD) LV vol d, MOD A2C: 76.3 ml LV vol d, MOD A4C: 66.1 ml LV vol s, MOD A2C: 35.6 ml LV vol s, MOD A4C: 31.8  ml LV SV MOD A2C:     40.7 ml LV SV MOD A4C:     66.1 ml LV SV MOD BP:      38.2 ml RIGHT VENTRICLE RV Mid diam:    2.07 cm RV S prime:     10.70 cm/s TAPSE (M-mode): 1.8 cm LEFT ATRIUM             Index LA diam:        3.80 cm 2.23 cm/m LA Vol (A2C):   34.3 ml 20.14 ml/m LA Vol (A4C):   54.5 ml 32.01 ml/m LA Biplane Vol: 47.5 ml 27.90 ml/m  AORTIC VALVE AV Area (Vmax):    2.35 cm AV Area (Vmean):   2.52 cm AV Area (VTI):     2.58 cm AV Vmax:           131.52 cm/s AV Vmean:          89.206 cm/s AV VTI:            0.220 m AV Peak Grad:      6.9 mmHg AV Mean Grad:      3.6 mmHg LVOT Vmax:         81.30 cm/s LVOT Vmean:        59.100 cm/s LVOT VTI:          0.149 m LVOT/AV VTI ratio: 0.68  AORTA Ao Root diam: 3.00 cm MITRAL VALVE               TRICUSPID VALVE MV Area (PHT): 5.02 cm    TR Peak grad:   25.8 mmHg MV Decel Time: 151 msec    TR Vmax:        254.00 cm/s MV E velocity: 65.60 cm/s MV A velocity: 95.90 cm/s  SHUNTS MV E/A ratio:  0.68        Systemic VTI:  0.15 m  Systemic Diam: 2.20 cm Carlyle Dolly MD Electronically signed by Carlyle Dolly MD Signature Date/Time: 09/21/2019/11:14:09 AM    Final     Recent Labs: Lab Results  Component Value Date   WBC 3.9 08/03/2019   HGB 12.8 08/03/2019   PLT 280 08/03/2019   NA 139 08/03/2019   K 4.1 08/03/2019   CL 99 08/03/2019   CO2 25 08/03/2019   GLUCOSE 80 08/03/2019   BUN 6 08/03/2019   CREATININE 0.61 08/03/2019   BILITOT 0.4 08/03/2019   ALKPHOS 96 08/03/2019   AST 20 08/03/2019   ALT 8 08/03/2019   PROT 7.4 08/03/2019   ALBUMIN 4.3 08/03/2019   CALCIUM 10.3 (H) 08/03/2019   GFRAA 119 08/03/2019   QFTBGOLDPLUS NEGATIVE 06/23/2018    Speciality Comments: PLQ Eye Exam: 07/08/18 @ [facility]  Procedures:  No procedures performed Allergies: Patient has no known allergies.   Assessment / Plan:     Visit Diagnoses: No diagnosis found.  Orders: No orders of the defined types  were placed in this encounter.  No orders of the defined types were placed in this encounter.   Face-to-face time spent with patient was *** minutes. Greater than 50% of time was spent in counseling and coordination of care.  Follow-Up Instructions: No follow-ups on file.   Earnestine Mealing, CMA  Note - This record has been created using Editor, commissioning.  Chart creation errors have been sought, but may not always  have been located. Such creation errors do not reflect on  the standard of medical care.

## 2019-10-03 ENCOUNTER — Ambulatory Visit: Payer: Medicare Other | Admitting: Physician Assistant

## 2019-10-05 ENCOUNTER — Encounter (INDEPENDENT_AMBULATORY_CARE_PROVIDER_SITE_OTHER): Payer: Medicare Other | Admitting: Ophthalmology

## 2019-10-12 ENCOUNTER — Encounter (INDEPENDENT_AMBULATORY_CARE_PROVIDER_SITE_OTHER): Payer: Medicare Other | Admitting: Ophthalmology

## 2019-10-12 DIAGNOSIS — H2513 Age-related nuclear cataract, bilateral: Secondary | ICD-10-CM | POA: Diagnosis not present

## 2019-10-12 DIAGNOSIS — H43813 Vitreous degeneration, bilateral: Secondary | ICD-10-CM | POA: Diagnosis not present

## 2019-10-12 DIAGNOSIS — M069 Rheumatoid arthritis, unspecified: Secondary | ICD-10-CM | POA: Diagnosis not present

## 2019-10-13 ENCOUNTER — Other Ambulatory Visit: Payer: Self-pay | Admitting: Cardiovascular Disease

## 2019-10-13 ENCOUNTER — Encounter: Payer: Self-pay | Admitting: Cardiovascular Disease

## 2019-10-13 ENCOUNTER — Telehealth: Payer: Self-pay

## 2019-10-13 ENCOUNTER — Ambulatory Visit (INDEPENDENT_AMBULATORY_CARE_PROVIDER_SITE_OTHER): Payer: Medicare Other | Admitting: Cardiovascular Disease

## 2019-10-13 ENCOUNTER — Other Ambulatory Visit: Payer: Self-pay

## 2019-10-13 VITALS — BP 142/84 | HR 109 | Temp 98.0°F | Ht 62.0 in | Wt 132.0 lb

## 2019-10-13 DIAGNOSIS — I272 Pulmonary hypertension, unspecified: Secondary | ICD-10-CM

## 2019-10-13 DIAGNOSIS — Z01818 Encounter for other preprocedural examination: Secondary | ICD-10-CM

## 2019-10-13 DIAGNOSIS — M25473 Effusion, unspecified ankle: Secondary | ICD-10-CM

## 2019-10-13 DIAGNOSIS — M3219 Other organ or system involvement in systemic lupus erythematosus: Secondary | ICD-10-CM | POA: Diagnosis not present

## 2019-10-13 DIAGNOSIS — J849 Interstitial pulmonary disease, unspecified: Secondary | ICD-10-CM | POA: Diagnosis not present

## 2019-10-13 MED ORDER — SODIUM CHLORIDE 0.9% FLUSH: 3.0000 mL | Freq: Two times a day (BID) | INTRAVENOUS | Status: DC

## 2019-10-13 NOTE — H&P (View-Only) (Signed)
     CARDIOLOGY CONSULT NOTE  Patient ID: Kari Henson MRN: [id number] DOB/AGE: 55/10/1965 53 y.o.  Admit date: (Not on file) Primary Physician: Boone, Angela, NP  Reason for Consultation: Pulmonary hypertension  HPI: Kari Henson is a 53 y.o. female who is being seen today for the evaluation of pulmonary hypertension at the request of Mannam, Praveen, MD.   Past medical history includes systemic lupus erythematosus and interstitial lung disease.  She has been treated with Imuran and Plaquenil.  She underwent an echocardiogram on 09/21/2019 which demonstrated low normal LV systolic function, LVEF 50 to 55%, normal regional wall motion, grade 1 diastolic dysfunction, normal RV size and systolic function with mildly elevated pulmonary artery systolic pressures.  She underwent a high-resolution chest CT on 06/22/2019 which is reviewed below.  She was started on Lasix 20 mg daily on 09/22/2019 by pulmonary as she had some lower extremity edema.  She had several labs ordered including BNP, comprehensive metabolic panel, and CBC which are currently pending.  Renal function was normal on 08/03/2019.  She denies chest pain and palpitations.  Chronic exertional dyspnea is stable.  She currently denies leg swelling, orthopnea, and paroxysmal nocturnal dyspnea.  She has a chronic cough.   No Known Allergies  Current Outpatient Medications  Medication Sig Dispense Refill  . acetaminophen (TYLENOL) 500 MG tablet Take 500-1,000 mg by mouth every 6 (six) hours as needed for moderate pain.     . azaTHIOprine (IMURAN) 50 MG tablet Take 50 mg by mouth 2 (two) times daily.    . ferrous sulfate 324 MG TBEC Take 324 mg by mouth daily with breakfast.    . furosemide (LASIX) 20 MG tablet Take 1 tablet (20 mg total) by mouth daily. 30 tablet 1  . hydroxychloroquine (PLAQUENIL) 200 MG tablet Take 1 tablet (200 mg total) by mouth 2 (two) times daily. 60 tablet 0  . loratadine (CLARITIN) 10 MG tablet  Take 10 mg by mouth daily.    . Multiple Vitamin (MULTIVITAMIN WITH MINERALS) TABS tablet Take 1 tablet by mouth daily.    . Nintedanib (OFEV) 150 MG CAPS Take 150 mg by mouth 2 (two) times daily.    . Omega-3 Fatty Acids (FISH OIL) 1000 MG CAPS Take 1 capsule by mouth daily.    . pantoprazole (PROTONIX) 40 MG tablet Take 40 mg by mouth every morning.    . Vitamin D, Ergocalciferol, (DRISDOL) 50000 units CAPS capsule Take 50,000 Units by mouth every 7 (seven) days.     No current facility-administered medications for this visit.    Past Medical History:  Diagnosis Date  . GERD (gastroesophageal reflux disease)   . Lung fibrosis ([facility])    follows with a Dr. Anderson, pulmonary in [place], [place]  . 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 Seasonal allergies   . SLE (systemic lupus erythematosus) ([facility]) 2010   she was on MTX (d/c due to cytopenia).  She has been on Prednisone and Plaquenil    Past Surgical History:  Procedure Laterality Date  . CESAREAN SECTION    . COLONOSCOPY N/A 04/27/2014   SLF:  1. one colon polyp removed 2 The left colonis redundant 3. the examined terminal ileum apperared to be  normal 3. small internal hemorrohoids.  . ESOPHAGOGASTRODUODENOSCOPY N/A 04/27/2014   SLF: 1. No obvious source for abdominal pain identified. 2. Non-erosive gastritis (inflammation) was found in the gastric antrum; multiple biopsies were performed.  . HERNIA REPAIR    .   MULTIPLE TOOTH EXTRACTIONS      Social History   Socioeconomic History  . Marital status: Single    Spouse name: Not on file  . Number of children: 1  . Years of education: Not on file  . Highest education level: Not on file  Occupational History    Comment: used to work in mill; now applying for disability due to SLE  Tobacco Use  . Smoking status: Former Smoker    Packs/day: 2.00    Years: 28.00    Pack years: 56.00    Types: Cigarettes    Quit date: 08/31/2011    Years since quitting: 8.1   . Smokeless tobacco: Never Used  Substance and Sexual Activity  . Alcohol use: No    Alcohol/week: 0.0 standard drinks  . Drug use: Never  . Sexual activity: Yes  Other Topics Concern  . Not on file  Social History Narrative  . Not on file   Social Determinants of Health   Financial Resource Strain:   . Difficulty of Paying Living Expenses:   Food Insecurity:   . Worried About Running Out of Food in the Last Year:   . Ran Out of Food in the Last Year:   Transportation Needs:   . Lack of Transportation (Medical):   . Lack of Transportation (Non-Medical):   Physical Activity:   . Days of Exercise per Week:   . Minutes of Exercise per Session:   Stress:   . Feeling of Stress :   Social Connections:   . Frequency of Communication with Friends and Family:   . Frequency of Social Gatherings with Friends and Family:   . Attends Religious Services:   . Active Member of Clubs or Organizations:   . Attends Club or Organization Meetings:   . Marital Status:   Intimate Partner Violence:   . Fear of Current or Ex-Partner:   . Emotionally Abused:   . Physically Abused:   . Sexually Abused:    Catherine Carlton, RN was present throughout the entirety of the encounter.  No family history of premature CAD in 1st degree relatives.  Current Meds  Medication Sig  . acetaminophen (TYLENOL) 500 MG tablet Take 500-1,000 mg by mouth every 6 (six) hours as needed for moderate pain.   . azaTHIOprine (IMURAN) 50 MG tablet Take 50 mg by mouth 2 (two) times daily.  . ferrous sulfate 324 MG TBEC Take 324 mg by mouth daily with breakfast.  . furosemide (LASIX) 20 MG tablet Take 1 tablet (20 mg total) by mouth daily.  . hydroxychloroquine (PLAQUENIL) 200 MG tablet Take 1 tablet (200 mg total) by mouth 2 (two) times daily.  . loratadine (CLARITIN) 10 MG tablet Take 10 mg by mouth daily.  . Multiple Vitamin (MULTIVITAMIN WITH MINERALS) TABS tablet Take 1 tablet by mouth daily.  . Nintedanib (OFEV)  150 MG CAPS Take 150 mg by mouth 2 (two) times daily.  . Omega-3 Fatty Acids (FISH OIL) 1000 MG CAPS Take 1 capsule by mouth daily.  . pantoprazole (PROTONIX) 40 MG tablet Take 40 mg by mouth every morning.  . Vitamin D, Ergocalciferol, (DRISDOL) 50000 units CAPS capsule Take 50,000 Units by mouth every 7 (seven) days.      Review of systems complete and found to be negative unless listed above in HPI   Physical exam Blood pressure (!) 142/84, pulse (!) 109, temperature 98 F (36.7 C), height 5' 2" (1.575 m), weight 132 lb (59.9 kg), last menstrual period   07/01/2013, SpO2 (!) 89 %. General: NAD Neck: No JVD, no thyromegaly or thyroid nodule.  Lungs: Clear to auscultation bilaterally with normal respiratory effort. CV: Nondisplaced PMI. Regular rate and rhythm, normal S1/S2, no S3/S4, no murmur.  No peripheral edema.  No carotid bruit.    Abdomen: Soft, nontender, no distention.  Skin: Intact without lesions or rashes.  Neurologic: Alert and oriented x 3.  Psych: Normal affect. Extremities: No clubbing or cyanosis.  HEENT: Normal.   ECG: Most recent ECG reviewed.   Labs: Lab Results  Component Value Date/Time   K 4.1 08/03/2019 08:52 AM   BUN 6 08/03/2019 08:52 AM   CREATININE 0.61 08/03/2019 08:52 AM   CREATININE 0.54 04/28/2019 01:31 PM   ALT 8 08/03/2019 08:52 AM   ALT 23 02/19/2014 12:00 AM   HGB 12.8 08/03/2019 08:52 AM     Lipids: No results found for: LDLCALC, LDLDIRECT, CHOL, TRIG, HDL      ASSESSMENT AND PLAN:   1.  Pulmonary hypertension: Mildly elevated PA pressures by echocardiogram with RVSP of 33.8 mmHg.  This is likely secondary to interstitial lung disease/pulmonary fibrosis due to underlying connective tissue disease.  Chest CT on 06/22/2019 showed advanced fibrotic interstitial lung disease with honeycombing and a stable dilated main pulmonary artery suggesting chronic pulmonary arterial hypertension.  She quit smoking in 2013. I will arrange for right  heart catheterization.  2.  Bilateral ankle edema: Currently euvolemic on Lasix 20 mg daily.  3.  SLE: Currently on Plaquenil and Imuran.    Disposition: Follow up in 3 months virtual visit  Signed: Kate Sable, M.D., F.A.C.C.  10/13/2019, 1:17 PM

## 2019-10-13 NOTE — Telephone Encounter (Signed)
I called patient to schedule a sooner appointment based on eye exam and patient cannot come into the office sooner than her scheduled appointment (10/24/2019). Patient states she has been to the cardiologist and has appointments and a small procedure next week. I advised patient to please try to keep the appointment on 10/24/2019 in the office. if for some reason she couldn't, to call and we could even schedule a virtual. Patient verbalized understanding.

## 2019-10-13 NOTE — Patient Instructions (Addendum)
Medication Instructions:  Your physician recommends that you continue on your current medications as directed. Please refer to the Current Medication list given to you today.  *If you need a refill on your cardiac medications before your next appointment, please call your pharmacy*   Lab Work: Cbc, bmet prior to cath  If you have labs (blood work) drawn today and your tests are completely normal, you will receive your results only by: Marland Kitchen MyChart Message (if you have MyChart) OR . A paper copy in the mail If you have any lab test that is abnormal or we need to change your treatment, we will call you to review the results.   Testing/Procedures: Right heart cath at [facility], Friday, April 23 rd at 1030 am with Dr.Cooper.   See attached instruction sheet    Follow-Up: At [facility], you and your health needs are our priority.  As part of our continuing mission to provide you with exceptional heart care, we have created designated Provider Care Teams.  These Care Teams include your primary Cardiologist (physician) and Advanced Practice Providers (APPs -  Physician Assistants and Nurse Practitioners) who all work together to provide you with the care you need, when you need it.  We recommend signing up for the patient portal called "MyChart".  Sign up information is provided on this After Visit Summary.  MyChart is used to connect with patients for Virtual Visits (Telemedicine).  Patients are able to view lab/test results, encounter notes, upcoming appointments, etc.  Non-urgent messages can be sent to your provider as well.   To learn more about what you can do with MyChart, go to [email or web address].    Your next appointment:   3 weeks  The format for your next appointment:   Virtual Visit   Provider:   Kate Sable, MD   Other Instructions COVID Test to be done on Tuesday April 20 th at 4:10 pm

## 2019-10-13 NOTE — Addendum Note (Signed)
Addended by: Barbarann Ehlers A on: 10/13/2019 01:51 PM   Modules accepted: Orders

## 2019-10-13 NOTE — Progress Notes (Signed)
CARDIOLOGY CONSULT NOTE  Patient ID: Kari Henson MRN: [id number] DOB/AGE: 1965/10/19 54 y.o.  Admit date: (Not on file) Primary Physician: [name], NP  Reason for Consultation: Pulmonary hypertension  HPI: Kari Henson is a 54 y.o. female who is being seen today for the evaluation of pulmonary hypertension at the request of Marshell Garfinkel, MD.   Past medical history includes systemic lupus erythematosus and interstitial lung disease.  She has been treated with Imuran and Plaquenil.  She underwent an echocardiogram on 09/21/2019 which demonstrated low normal LV systolic function, LVEF 50 to 55%, normal regional wall motion, grade 1 diastolic dysfunction, normal RV size and systolic function with mildly elevated pulmonary artery systolic pressures.  She underwent a high-resolution chest CT on 06/22/2019 which is reviewed below.  She was started on Lasix 20 mg daily on 09/22/2019 by pulmonary as she had some lower extremity edema.  She had several labs ordered including BNP, comprehensive metabolic panel, and CBC which are currently pending.  Renal function was normal on 08/03/2019.  She denies chest pain and palpitations.  Chronic exertional dyspnea is stable.  She currently denies leg swelling, orthopnea, and paroxysmal nocturnal dyspnea.  She has a chronic cough.   No Known Allergies  Current Outpatient Medications  Medication Sig Dispense Refill  . acetaminophen (TYLENOL) 500 MG tablet Take 500-1,000 mg by mouth every 6 (six) hours as needed for moderate pain.     Marland Kitchen azaTHIOprine (IMURAN) 50 MG tablet Take 50 mg by mouth 2 (two) times daily.    . ferrous sulfate 324 MG TBEC Take 324 mg by mouth daily with breakfast.    . furosemide (LASIX) 20 MG tablet Take 1 tablet (20 mg total) by mouth daily. 30 tablet 1  . hydroxychloroquine (PLAQUENIL) 200 MG tablet Take 1 tablet (200 mg total) by mouth 2 (two) times daily. 60 tablet 0  . loratadine (CLARITIN) 10 MG tablet  Take 10 mg by mouth daily.    . Multiple Vitamin (MULTIVITAMIN WITH MINERALS) TABS tablet Take 1 tablet by mouth daily.    . Nintedanib (OFEV) 150 MG CAPS Take 150 mg by mouth 2 (two) times daily.    . Omega-3 Fatty Acids (FISH OIL) 1000 MG CAPS Take 1 capsule by mouth daily.    . pantoprazole (PROTONIX) 40 MG tablet Take 40 mg by mouth every morning.    . Vitamin D, Ergocalciferol, (DRISDOL) 50000 units CAPS capsule Take 50,000 Units by mouth every 7 (seven) days.     No current facility-administered medications for this visit.    Past Medical History:  Diagnosis Date  . GERD (gastroesophageal reflux disease)   . Lung fibrosis ([place])    follows with a Dr. Ouida Sills, pulmonary in [place], [place]  . 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 Seasonal allergies   . SLE (systemic lupus erythematosus) ([place]) 2010   she was on MTX (d/c due to cytopenia).  She has been on Prednisone and Plaquenil    Past Surgical History:  Procedure Laterality Date  . CESAREAN SECTION    . COLONOSCOPY N/A 04/27/2014   SLF:  1. one colon polyp removed 2 The left colonis redundant 3. the examined terminal ileum apperared to be  normal 3. small internal hemorrohoids.  . ESOPHAGOGASTRODUODENOSCOPY N/A 04/27/2014   SLF: 1. No obvious source for abdominal pain identified. 2. Non-erosive gastritis (inflammation) was found in the gastric antrum; multiple biopsies were performed.  Marland Kitchen HERNIA REPAIR    .  MULTIPLE TOOTH EXTRACTIONS      Social History   Socioeconomic History  . Marital status: Single    Spouse name: Not on file  . Number of children: 1  . Years of education: Not on file  . Highest education level: Not on file  Occupational History    Comment: used to work in [employer]; now applying for disability due to SLE  Tobacco Use  . Smoking status: Former Smoker    Packs/day: 2.00    Years: 28.00    Pack years: 56.00    Types: Cigarettes    Quit date: 08/31/2011    Years since quitting: 8.1   . Smokeless tobacco: Never Used  Substance and Sexual Activity  . Alcohol use: No    Alcohol/week: 0.0 standard drinks  . Drug use: Never  . Sexual activity: Yes  Other Topics Concern  . Not on file  Social History Narrative  . Not on file   Social Determinants of Health   Financial Resource Strain:   . Difficulty of Paying Living Expenses:   Food Insecurity:   . Worried About Charity fundraiser in the Last Year:   . Arboriculturist in the Last Year:   Transportation Needs:   . Film/video editor (Medical):   Marland Kitchen Lack of Transportation (Non-Medical):   Physical Activity:   . Days of Exercise per Week:   . Minutes of Exercise per Session:   Stress:   . Feeling of Stress :   Social Connections:   . Frequency of Communication with Friends and Family:   . Frequency of Social Gatherings with Friends and Family:   . Attends Religious Services:   . Active Member of Clubs or Organizations:   . Attends Archivist Meetings:   Marland Kitchen Marital Status:   Intimate Partner Violence:   . Fear of Current or Ex-Partner:   . Emotionally Abused:   Marland Kitchen Physically Abused:   . Sexually Abused:    Barbarann Ehlers, RN was present throughout the entirety of the encounter.  No family history of premature CAD in 1st degree relatives.  Current Meds  Medication Sig  . acetaminophen (TYLENOL) 500 MG tablet Take 500-1,000 mg by mouth every 6 (six) hours as needed for moderate pain.   Marland Kitchen azaTHIOprine (IMURAN) 50 MG tablet Take 50 mg by mouth 2 (two) times daily.  . ferrous sulfate 324 MG TBEC Take 324 mg by mouth daily with breakfast.  . furosemide (LASIX) 20 MG tablet Take 1 tablet (20 mg total) by mouth daily.  . hydroxychloroquine (PLAQUENIL) 200 MG tablet Take 1 tablet (200 mg total) by mouth 2 (two) times daily.  Marland Kitchen loratadine (CLARITIN) 10 MG tablet Take 10 mg by mouth daily.  . Multiple Vitamin (MULTIVITAMIN WITH MINERALS) TABS tablet Take 1 tablet by mouth daily.  . Nintedanib (OFEV)  150 MG CAPS Take 150 mg by mouth 2 (two) times daily.  . Omega-3 Fatty Acids (FISH OIL) 1000 MG CAPS Take 1 capsule by mouth daily.  . pantoprazole (PROTONIX) 40 MG tablet Take 40 mg by mouth every morning.  . Vitamin D, Ergocalciferol, (DRISDOL) 50000 units CAPS capsule Take 50,000 Units by mouth every 7 (seven) days.      Review of systems complete and found to be negative unless listed above in HPI   Physical exam Blood pressure (!) 142/84, pulse (!) 109, temperature 98 F (36.7 C), height _0  (1.575 m), weight 132 lb (59.9 kg), last menstrual period  07/01/2013, SpO2 (!) 89 %. General: NAD Neck: No JVD, no thyromegaly or thyroid nodule.  Lungs: Clear to auscultation bilaterally with normal respiratory effort. CV: Nondisplaced PMI. Regular rate and rhythm, normal S1/S2, no S3/S4, no murmur.  No peripheral edema.  No carotid bruit.    Abdomen: Soft, nontender, no distention.  Skin: Intact without lesions or rashes.  Neurologic: Alert and oriented x 3.  Psych: Normal affect. Extremities: No clubbing or cyanosis.  HEENT: Normal.   ECG: Most recent ECG reviewed.   Labs: Lab Results  Component Value Date/Time   K 4.1 08/03/2019 08:52 AM   BUN 6 08/03/2019 08:52 AM   CREATININE 0.61 08/03/2019 08:52 AM   CREATININE 0.54 04/28/2019 01:31 PM   ALT 8 08/03/2019 08:52 AM   ALT 23 02/19/2014 12:00 AM   HGB 12.8 08/03/2019 08:52 AM     Lipids: No results found for: LDLCALC, LDLDIRECT, CHOL, TRIG, HDL      ASSESSMENT AND PLAN:   1.  Pulmonary hypertension: Mildly elevated PA pressures by echocardiogram with RVSP of 33.8 mmHg.  This is likely secondary to interstitial lung disease/pulmonary fibrosis due to underlying connective tissue disease.  Chest CT on 06/22/2019 showed advanced fibrotic interstitial lung disease with honeycombing and a stable dilated main pulmonary artery suggesting chronic pulmonary arterial hypertension.  She quit smoking in 2013. I will arrange for right  heart catheterization.  2.  Bilateral ankle edema: Currently euvolemic on Lasix 20 mg daily.  3.  SLE: Currently on Plaquenil and Imuran.    Disposition: Follow up in 3 months virtual visit  Signed: Kate Sable, M.D., F.A.C.C.  10/13/2019, 1:17 PM

## 2019-10-16 NOTE — Telephone Encounter (Signed)
Thank you for informing me.

## 2019-10-17 ENCOUNTER — Other Ambulatory Visit (HOSPITAL_COMMUNITY)
Admission: RE | Admit: 2019-10-17 | Discharge: 2019-10-17 | Disposition: A | Discharge status: Home / Self Care | Payer: Medicare Other | Source: Home / Self Care | Attending: Cardiovascular Disease | Admitting: Cardiovascular Disease

## 2019-10-17 ENCOUNTER — Other Ambulatory Visit: Payer: Self-pay

## 2019-10-17 ENCOUNTER — Other Ambulatory Visit (HOSPITAL_COMMUNITY)
Admission: RE | Admit: 2019-10-17 | Discharge: 2019-10-17 | Disposition: A | Discharge status: Home / Self Care | Payer: Medicare Other | Source: Ambulatory Visit | Attending: Cardiovascular Disease | Admitting: Cardiovascular Disease

## 2019-10-17 DIAGNOSIS — Z20822 Contact with and (suspected) exposure to covid-19: Secondary | ICD-10-CM | POA: Insufficient documentation

## 2019-10-17 DIAGNOSIS — Z01812 Encounter for preprocedural laboratory examination: Secondary | ICD-10-CM | POA: Diagnosis not present

## 2019-10-17 LAB — BASIC METABOLIC PANEL
Anion gap: 13 (ref 5–15)
BUN: 5 mg/dL — ABNORMAL LOW (ref 6–20)
CO2: 30 mmol/L (ref 22–32)
Calcium: 11 mg/dL — ABNORMAL HIGH (ref 8.9–10.3)
Chloride: 96 mmol/L — ABNORMAL LOW (ref 98–111)
Creatinine, Ser: 0.56 mg/dL (ref 0.44–1.00)
GFR calc Af Amer: >60 mL/min (ref >60)
GFR calc non Af Amer: >60 mL/min (ref >60)
Glucose, Bld: 92 mg/dL (ref 70–99)
Potassium: 3 mmol/L — ABNORMAL LOW (ref 3.5–5.1)
Sodium: 139 mmol/L (ref 135–145)

## 2019-10-17 LAB — CBC WITH DIFFERENTIAL/PLATELET
Abs Immature Granulocytes: 0.01 10*3/uL (ref 0.00–0.07)
Basophils Absolute: 0.1 10*3/uL (ref 0.0–0.1)
Basophils Relative: 1 %
Eosinophils Absolute: 0.3 10*3/uL (ref 0.0–0.5)
Eosinophils Relative: 6 %
HCT: 36.7 % (ref 36.0–46.0)
Hemoglobin: 11.6 g/dL — ABNORMAL LOW (ref 12.0–15.0)
Immature Granulocytes: 0 %
Lymphocytes Relative: 16 %
Lymphs Abs: 1 10*3/uL (ref 0.7–4.0)
MCH: 26.7 pg (ref 26.0–34.0)
MCHC: 31.6 g/dL (ref 30.0–36.0)
MCV: 84.6 fL (ref 80.0–100.0)
Monocytes Absolute: 1.3 10*3/uL — ABNORMAL HIGH (ref 0.1–1.0)
Monocytes Relative: 22 %
Neutro Abs: 3.4 10*3/uL (ref 1.7–7.7)
Neutrophils Relative %: 55 %
Platelets: 308 10*3/uL (ref 150–400)
RBC: 4.34 MIL/uL (ref 3.87–5.11)
RDW: 14.4 % (ref 11.5–15.5)
WBC: 6 10*3/uL (ref 4.0–10.5)
nRBC: 0 % (ref 0.0–0.2)

## 2019-10-17 NOTE — Progress Notes (Signed)
Office Visit Note  Patient: Kari Henson             Date of Birth: 04/01/1966           MRN: [id number]             PCP: [name], NP Referring: [name], NP Visit Date: 10/24/2019 Occupation: _0 @  Subjective:  Discuss medications   History of Present Illness: Kari Henson is a 54 y.o. female with history of systemic lupus erythematosus and ILD.  Patient is taking Imuran 50 mg 2 tablets daily, Plaquenil 200 mg 1 tablet twice daily, and Ofev BID.  Patient presents today to discuss recent Plaquenil eye exam on 10/12/2019.  She had no evidence of Plaquenil eye toxicity but according to Dr. Zigmund Daniel her dose exceeds the safe ocular dose range.  She denies any recent lupus flares.  She has not had any recent rashes, symptoms of Raynaud's, oral or nasal ulcerations, sicca symptoms, enlarged lymph nodes, increased fatigue, increased shortness of breath, or chest pain.  She experiences some shortness of breath with exertion but her breathing has been stable.  She is followed by Dr. [name].  According to the patient she recently had bilateral ankle swelling and was prescribed Lasix by Dr. [name] which has resolved her symptoms.  She is not having any other joint pain or joint swelling at this time.   Activities of Daily Living:  Patient reports morning stiffness for 0 none.   Patient Denies nocturnal pain.  Difficulty dressing/grooming: Denies Difficulty climbing stairs: Denies Difficulty getting out of chair: Denies Difficulty using hands for taps, buttons, cutlery, and/or writing: Denies  Review of Systems  Constitutional: Negative for fatigue.  HENT: Negative for mouth sores, mouth dryness and nose dryness.   Eyes: Negative for pain, visual disturbance and dryness.  Respiratory: Negative for cough, hemoptysis, shortness of breath and difficulty breathing.   Cardiovascular: Positive for swelling in legs/feet. Negative for chest pain, palpitations and hypertension.    Gastrointestinal: Negative for blood in stool, constipation and diarrhea.  Endocrine: Negative for excessive thirst and increased urination.  Genitourinary: Negative for difficulty urinating and painful urination.  Musculoskeletal: Negative for arthralgias, joint pain, myalgias, muscle weakness, morning stiffness, muscle tenderness and myalgias.  Skin: Negative for color change, pallor, rash, hair loss, nodules/bumps, skin tightness, ulcers and sensitivity to sunlight.  Allergic/Immunologic: Negative for susceptible to infections.  Neurological: Negative for dizziness, numbness, headaches and weakness.  Hematological: Negative for bruising/bleeding tendency and swollen glands.  Psychiatric/Behavioral: Negative for depressed mood and sleep disturbance. The patient is not nervous/anxious.     PMFS History:  Patient Active Problem List   Diagnosis Date Noted  . Other secondary pulmonary hypertension ([place]) 10/20/2019  . 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 Vitamin D deficiency 04/30/2017  . High risk medication use 04/30/2017  . History of bone density study 04/30/2017  . Chronic cough 07/24/2016  . ILD (interstitial lung disease) ([place]) 07/24/2016  . Abdominal pain, epigastric 03/29/2014  . Encounter for screening colonoscopy 03/29/2014  . 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 SLE (systemic lupus erythematosus) ([place])   . Lung fibrosis ([place])     Past Medical History:  Diagnosis Date  . GERD (gastroesophageal reflux disease)   . 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 Ouida Sills, pulmonary in [place], [place]  . 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 Seasonal allergies   . SLE (systemic lupus  erythematosus) ([place]) 2010   she was on MTX (d/c due to cytopenia).  She has been on Prednisone and Plaquenil    Family History  Problem Relation Age of Onset  . Asthma Mother   . Thyroid disease Mother   . Hypertension  Mother   . Cirrhosis Father   . Alcohol abuse Father   . Diabetes Father   . COPD Father   . Diabetes Maternal Aunt   . Hypertension Sister   . Healthy Brother   . Diabetes Maternal Uncle   . Diabetes Paternal Aunt   . Hypertension Maternal Grandmother   . COPD Maternal Grandmother   . Arthritis Maternal Grandmother        rheumatoid arthritis  . Healthy Son   . Colon cancer Neg Hx    Past Surgical History:  Procedure Laterality Date  . CESAREAN SECTION    . COLONOSCOPY N/A 04/27/2014   SLF:  1. one colon polyp removed 2 The left colonis redundant 3. the examined terminal ileum apperared to be  normal 3. small internal hemorrohoids.  . ESOPHAGOGASTRODUODENOSCOPY N/A 04/27/2014   SLF: 1. No obvious source for abdominal pain identified. 2. Non-erosive gastritis (inflammation) was found in the gastric antrum; multiple biopsies were performed.  Marland Kitchen HERNIA REPAIR    . MULTIPLE TOOTH EXTRACTIONS    . RIGHT HEART CATH N/A 10/20/2019   Procedure: RIGHT HEART CATH;  Surgeon: Sherren Mocha, MD;  Location: [facility];  Service: Cardiovascular;  Laterality: N/A;   Social History   Social History Narrative  . Not on file   Immunization History  Administered Date(s) Administered  . Influenza Split 03/24/2016  . Influenza,inj,Quad PF,6+ Mos 04/15/2019  . Influenza-Unspecified 04/19/2017  . Pneumococcal Polysaccharide-23 07/24/2013, 07/29/2018     Objective: Vital Signs: BP (!) 141/82 (BP Location: Left Arm, Patient Position: Sitting, Cuff Size: Normal)   Pulse (!) 121   Resp 16   Ht _0  (1.575 m)   Wt 129 lb 6.4 oz (58.7 kg)   LMP 07/01/2013   BMI 23.67 kg/m    Physical Exam Vitals and nursing note reviewed.  Constitutional:      Appearance: She is well-developed.  HENT:     Head: Normocephalic and atraumatic.  Eyes:     Conjunctiva/sclera: Conjunctivae normal.  Pulmonary:     Effort: Pulmonary effort is normal.     Comments: Clubbing of fingers  noted Abdominal:     General: Bowel sounds are normal.     Palpations: Abdomen is soft.  Musculoskeletal:     Cervical back: Normal range of motion.  Lymphadenopathy:     Cervical: No cervical adenopathy.  Skin:    General: Skin is warm and dry.     Capillary Refill: Capillary refill takes less than 2 seconds.  Neurological:     Mental Status: She is alert and oriented to person, place, and time.  Psychiatric:        Behavior: Behavior normal.      Musculoskeletal Exam: C-spine, thoracic spine, lumbar spine good range of motion.  No midline spinal tenderness. Shoulder joints, elbow joints, wrist joints, MCPs, PIPs, DIPs good range of motion with no synovitis.  She is complete fist formation bilaterally.  Hip joints, knee joints, ankle joints, MTPs, PIPs and DIPs good range of motion with no synovitis.  No warmth or effusion of bilateral knee joints.  No tenderness or swelling of ankle joints.  No pedal edema noted on exam.  CDAI Exam: CDAI Score: --  Patient Global: --; Provider Global: -- Swollen: --; Tender: -- Joint Exam 10/24/2019   No joint exam has been documented for this visit   There is currently no information documented on the homunculus. Go to the Rheumatology activity and complete the homunculus joint exam.  Investigation: No additional findings.  Imaging: CARDIAC CATHETERIZATION  Result Date: 10/20/2019 Normal right heart pressures Normal cardiac output No evidence of pulmonary hypertension PA pressure 28/9 with mean of 19 mmHg RA pressure 3 with mean of 0 mmHg Pulmonary capillary wedge pressure A-wave 5, V wave 4, mean 3 mmHg Fick cardiac output 5.4 L/min   Recent Labs: Lab Results  Component Value Date   WBC 6.0 10/17/2019   HGB 11.2 (L) 10/20/2019   PLT 308 10/17/2019   NA 141 10/20/2019   K 3.3 (L) 10/20/2019   CL 96 (L) 10/17/2019   CO2 30 10/17/2019   GLUCOSE 92 10/17/2019   BUN 5 (L) 10/17/2019   CREATININE 0.56 10/17/2019   BILITOT 0.4  08/03/2019   ALKPHOS 96 08/03/2019   AST 20 08/03/2019   ALT 8 08/03/2019   PROT 7.4 08/03/2019   ALBUMIN 4.3 08/03/2019   CALCIUM 11.0 (H) 10/17/2019   GFRAA >60 10/17/2019   QFTBGOLDPLUS NEGATIVE 06/23/2018    Speciality Comments: PLQ Eye Exam: 07/08/18 @ [facility]  Procedures:  No procedures performed Allergies: Patient has no known allergies.   Assessment / Plan:     Visit Diagnoses: Other systemic lupus erythematosus with other organ involvement (HCC) -  Positive ANA, DS DNA, Ro, Smith, RNP,elevated ESR, malar rash, photosensitivity, neutropenia, hypocomplementemia: She has not had any signs or symptoms of a systemic lupus flare recently.  No synovitis was noted on exam.  She has not had any recent Malar rashes or photosensitivity.  Discussed the importance of wearing sunscreen SPF greater than 50 on a daily basis.  She has not had any symptoms of Raynaud's recently.  No digital ulcerations or signs of gangrene were noted.  She is good capillary refill on exam.  She has not had any recent oral or nasal ulcerations.  She has not had any increase sicca symptoms.  No cervical lymphadenopathy was palpable today.  No parotid tenderness or swelling noted.  She is clinically been doing well on Plaquenil 200 mg 1 tablet by mouth twice daily and Imuran 100 mg by mouth daily.  She was started on Imuran 50 mg daily December 2019 and increased to 50 mg twice daily November 2020 due to elevated sed rate.  She has been tolerating both medications without any side effects.  She had a recent Plaquenil eye exam on 10/12/2019 which did not reveal any signs of ocular toxicity.  According to Dr. Zigmund Daniel note based on the patient's weight the current dose was at 6.60 mg/kg/day which exceeds the safe ocular dose range.  We discussed reducing Plaquenil to 200 mg 1 tablet twice daily Monday through Friday.  She will continue taking Imuran as prescribed.  She is in agreement with the plan. We  will obtain [facility] labs in 3 months and check Plaquenil level at that time. Orders were provided to the patient today.  She was advised to notify us if she develops any signs or symptoms of a flare after reducing the dose of Plaquenil.  She will follow up in 3 months.   High risk medication use - Imuran 100 mg daily and Plaquenil 200 mg 1 tablet twice daily M-F. (She started on Imuran  in December 2019 and increased the dose of Imuran to 100 mg daily in November 2020).  Plaquenil eye exam performed on 10/12/2019 did not reveal any evidence of toxicity.  According to Dr. Zigmund Daniel based on the patient's weight the current dosage is 6.62 mg/kg/day which exceeds the safe ocular dose range.  The patient has been taking Plaquenil for 8 years now.  We discussed reducing the dose of Plaquenil to 200 mg 1 tablet by mouth twice daily Monday through Friday.  We will check a Plaquenil level with her upcoming lab work in 3 months.  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 She is taking Ofev 150 mg BID.   ILD (interstitial lung disease) ([place]) - Dr. Dola Factor was started on Ofev 150 mg BID on 07/24/2019.  Her dyspnea has been stable.  A future order for hepatic panel is in place.  Clubbing of fingers: Chronic, stable.   Other fatigue: Chronic but stable.   Vitamin D deficiency: She is taking vitamin D 50,000 units once weekly.  MGUS (monoclonal gammopathy of unknown significance)  Former smoker  Orders: No orders of the defined types were placed in this encounter.  No orders of the defined types were placed in this encounter.   Face-to-face time spent with patient was 30 minutes. Greater than 50% of time was spent in counseling and coordination of care.  Follow-Up Instructions: Return in about 3 months (around 01/23/2020) for Systemic lupus erythematosus.   Ofilia Neas, PA-C  Note - This record  has been created using Dragon software.  Chart creation errors have been sought, but may not always  have been located. Such creation errors do not reflect on  the standard of medical care.

## 2019-10-18 ENCOUNTER — Telehealth: Payer: Self-pay | Admitting: *Deleted

## 2019-10-18 ENCOUNTER — Other Ambulatory Visit: Payer: Self-pay | Admitting: Physician Assistant

## 2019-10-18 DIAGNOSIS — Z79899 Other long term (current) drug therapy: Secondary | ICD-10-CM

## 2019-10-18 LAB — SARS CORONAVIRUS 2 (TAT 6-24 HRS): SARS Coronavirus 2: NEGATIVE

## 2019-10-18 MED ORDER — POTASSIUM CHLORIDE CRYS ER 20 MEQ PO TBCR: 20.0000 meq | EXTENDED_RELEASE_TABLET | Freq: Every day | ORAL | 3 refills | Status: DC

## 2019-10-18 NOTE — Telephone Encounter (Signed)
Last Visit: 08/02/2019 telemedicine  Next Visit: 10/24/2019 Labs: 10/17/2019 CBC, BMP: hemoglobin 11.6, monocytes absolute 1.3, potassium 3.0, chloride 96, BUN 5, calcium 11.0.  Okay to refill imuran?

## 2019-10-18 NOTE — Telephone Encounter (Signed)
-----  Message from Herminio Commons, MD sent at 10/17/2019 11:01 AM EDT ----- Potassium is low due to daily Lasix usage.  Lasix has been prescribed by pulmonary.  I would start potassium chloride 20 mEq daily and repeat a basic metabolic panel in 1 week.

## 2019-10-18 NOTE — Telephone Encounter (Signed)
Pt notified orders placed

## 2019-10-18 NOTE — Telephone Encounter (Signed)
Ok to refill Imuran.

## 2019-10-20 ENCOUNTER — Other Ambulatory Visit: Payer: Self-pay

## 2019-10-20 ENCOUNTER — Encounter (HOSPITAL_COMMUNITY)
Admission: RE | Disposition: A | Discharge status: Home / Self Care | Payer: Self-pay | Source: Home / Self Care | Attending: Cardiovascular Disease

## 2019-10-20 ENCOUNTER — Ambulatory Visit (HOSPITAL_COMMUNITY)
Admission: RE | Admit: 2019-10-20 | Discharge: 2019-10-20 | Disposition: A | Discharge status: Home / Self Care | Payer: Medicare Other | Attending: Cardiovascular Disease | Admitting: Cardiovascular Disease

## 2019-10-20 DIAGNOSIS — J8410 Pulmonary fibrosis, unspecified: Secondary | ICD-10-CM | POA: Diagnosis not present

## 2019-10-20 DIAGNOSIS — M329 Systemic lupus erythematosus, unspecified: Secondary | ICD-10-CM | POA: Diagnosis not present

## 2019-10-20 DIAGNOSIS — I2729 Other secondary pulmonary hypertension: Secondary | ICD-10-CM

## 2019-10-20 DIAGNOSIS — I272 Pulmonary hypertension, unspecified: Secondary | ICD-10-CM | POA: Diagnosis not present

## 2019-10-20 DIAGNOSIS — R0609 Other forms of dyspnea: Secondary | ICD-10-CM | POA: Insufficient documentation

## 2019-10-20 DIAGNOSIS — Z87891 Personal history of nicotine dependence: Secondary | ICD-10-CM | POA: Insufficient documentation

## 2019-10-20 DIAGNOSIS — Z79899 Other long term (current) drug therapy: Secondary | ICD-10-CM | POA: Diagnosis not present

## 2019-10-20 DIAGNOSIS — R600 Localized edema: Secondary | ICD-10-CM | POA: Insufficient documentation

## 2019-10-20 DIAGNOSIS — K219 Gastro-esophageal reflux disease without esophagitis: Secondary | ICD-10-CM | POA: Diagnosis not present

## 2019-10-20 HISTORY — PX: RIGHT HEART CATH: CATH118263

## 2019-10-20 LAB — POCT I-STAT EG7
Acid-Base Excess: 5 mmol/L — ABNORMAL HIGH (ref 0.0–2.0)
Acid-Base Excess: 5 mmol/L — ABNORMAL HIGH (ref 0.0–2.0)
Bicarbonate: 31.5 mmol/L — ABNORMAL HIGH (ref 20.0–28.0)
Bicarbonate: 31.6 mmol/L — ABNORMAL HIGH (ref 20.0–28.0)
Calcium, Ion: 1.56 mmol/L (ref 1.15–1.40)
Calcium, Ion: 1.59 mmol/L (ref 1.15–1.40)
HCT: 33 % — ABNORMAL LOW (ref 36.0–46.0)
HCT: 33 % — ABNORMAL LOW (ref 36.0–46.0)
Hemoglobin: 11.2 g/dL — ABNORMAL LOW (ref 12.0–15.0)
Hemoglobin: 11.2 g/dL — ABNORMAL LOW (ref 12.0–15.0)
O2 Saturation: 75 %
O2 Saturation: 78 %
Potassium: 3.2 mmol/L — ABNORMAL LOW (ref 3.5–5.1)
Potassium: 3.3 mmol/L — ABNORMAL LOW (ref 3.5–5.1)
Sodium: 141 mmol/L (ref 135–145)
Sodium: 141 mmol/L (ref 135–145)
TCO2: 33 mmol/L — ABNORMAL HIGH (ref 22–32)
TCO2: 33 mmol/L — ABNORMAL HIGH (ref 22–32)
pCO2, Ven: 53.8 mmHg (ref 44.0–60.0)
pCO2, Ven: 55.5 mmHg (ref 44.0–60.0)
pH, Ven: 7.363 (ref 7.250–7.430)
pH, Ven: 7.375 (ref 7.250–7.430)
pO2, Ven: 42 mmHg (ref 32.0–45.0)
pO2, Ven: 45 mmHg (ref 32.0–45.0)

## 2019-10-20 LAB — POTASSIUM: Potassium: 3.3 mmol/L — ABNORMAL LOW (ref 3.5–5.1)

## 2019-10-20 SURGERY — RIGHT HEART CATH

## 2019-10-20 MED ORDER — MIDAZOLAM HCL 2 MG/2ML IJ SOLN
INTRAMUSCULAR | Status: AC
  Filled 2019-10-20: qty 2

## 2019-10-20 MED ORDER — FENTANYL CITRATE (PF) 100 MCG/2ML IJ SOLN
INTRAMUSCULAR | Status: AC
  Filled 2019-10-20: qty 2

## 2019-10-20 MED ORDER — HEPARIN (PORCINE) IN NACL 1000-0.9 UT/500ML-% IV SOLN
INTRAVENOUS | Status: AC
  Filled 2019-10-20: qty 500

## 2019-10-20 MED ORDER — SODIUM CHLORIDE 0.9% FLUSH: 3.0000 mL | INTRAVENOUS | Status: DC | PRN

## 2019-10-20 MED ORDER — SODIUM CHLORIDE 0.9 % IV SOLN: 250.0000 mL | INTRAVENOUS | Status: DC | PRN

## 2019-10-20 MED ORDER — MIDAZOLAM HCL 2 MG/2ML IJ SOLN
INTRAMUSCULAR | Status: DC | PRN
  Administered 2019-10-20: 1 mg via INTRAVENOUS

## 2019-10-20 MED ORDER — ACETAMINOPHEN 325 MG PO TABS: 650.0000 mg | ORAL_TABLET | ORAL | Status: DC | PRN

## 2019-10-20 MED ORDER — SODIUM CHLORIDE 0.9 % WEIGHT BASED INFUSION: 3.0000 mL/kg/h | INTRAVENOUS | Status: AC

## 2019-10-20 MED ORDER — SODIUM CHLORIDE 0.9% FLUSH: 3.0000 mL | Freq: Two times a day (BID) | INTRAVENOUS | Status: DC

## 2019-10-20 MED ORDER — ONDANSETRON HCL 4 MG/2ML IJ SOLN: 4.0000 mg | Freq: Four times a day (QID) | INTRAMUSCULAR | Status: DC | PRN

## 2019-10-20 MED ORDER — HEPARIN (PORCINE) IN NACL 1000-0.9 UT/500ML-% IV SOLN
INTRAVENOUS | Status: DC | PRN
  Administered 2019-10-20: 500 mL

## 2019-10-20 MED ORDER — FENTANYL CITRATE (PF) 100 MCG/2ML IJ SOLN
INTRAMUSCULAR | Status: DC | PRN
  Administered 2019-10-20: 25 ug via INTRAVENOUS

## 2019-10-20 MED ORDER — LABETALOL HCL 5 MG/ML IV SOLN: 10.0000 mg | INTRAVENOUS | Status: DC | PRN

## 2019-10-20 MED ORDER — LIDOCAINE HCL (PF) 1 % IJ SOLN
INTRAMUSCULAR | Status: DC | PRN
  Administered 2019-10-20: 2 mL

## 2019-10-20 MED ORDER — SODIUM CHLORIDE 0.9 % WEIGHT BASED INFUSION: 1.0000 mL/kg/h | INTRAVENOUS | Status: DC

## 2019-10-20 MED ORDER — ASPIRIN 81 MG PO CHEW: 81.0000 mg | CHEWABLE_TABLET | ORAL | Status: DC

## 2019-10-20 MED ORDER — HYDRALAZINE HCL 20 MG/ML IJ SOLN: 10.0000 mg | INTRAMUSCULAR | Status: DC | PRN

## 2019-10-20 MED ORDER — LIDOCAINE HCL (PF) 1 % IJ SOLN
INTRAMUSCULAR | Status: AC
  Filled 2019-10-20: qty 30

## 2019-10-20 SURGICAL SUPPLY — 9 items
CATH BALLN WEDGE 5F 110CM (CATHETERS) ×2 IMPLANT
PACK CARDIAC CATHETERIZATION (CUSTOM PROCEDURE TRAY) ×2 IMPLANT
PROTECTION STATION PRESSURIZED (MISCELLANEOUS) ×3
SHEATH GLIDE SLENDER 4/5FR (SHEATH) ×2 IMPLANT
SHEATH PROBE COVER 6X72 (BAG) ×2 IMPLANT
STATION PROTECTION PRESSURIZED (MISCELLANEOUS) IMPLANT
TRANSDUCER W/STOPCOCK (MISCELLANEOUS) ×2 IMPLANT
TUBING ART PRESS 72  MALE/FEM (TUBING) ×2
TUBING ART PRESS 72 MALE/FEM (TUBING) IMPLANT

## 2019-10-20 NOTE — Interval H&P Note (Signed)
History and Physical Interval Note:  10/20/2019 10:38 AM  Kari Henson  has presented today for surgery, with the diagnosis of Pulmonary hypertension.  The various methods of treatment have been discussed with the patient and family. After consideration of risks, benefits and other options for treatment, the patient has consented to  Procedure(s): RIGHT HEART CATH (N/A) as a surgical intervention.  The patient's history has been reviewed, patient examined, no change in status, stable for surgery.  I have reviewed the patient's chart and labs.  Questions were answered to the patient's satisfaction.     Sherren Mocha

## 2019-10-20 NOTE — Discharge Instructions (Signed)
Venogram A venogram, or venography, is a procedure that uses an X-ray and dye (contrast) to examine how well the veins work and how blood flows through them. Contrast helps the veins show up on X-rays. A venogram may be done:  To evaluate abnormalities in the vein.  To identify clots within veins, such as deep vein thrombosis (DVT).  To map out the veins that might be needed for another procedure. Tell a health care provider about:  Any allergies you have, especially to medicines, shellfish, iodine, and contrast.  All medicines you are taking, including vitamins, herbs, eye drops, creams, and over-the-counter medicines.  Any problems you or family members have had with anesthetic medicines.  Any blood disorders you have.  Any surgeries you have had and any complications that occurred.  Any medical conditions you have.  Whether you are pregnant, may be pregnant, or are breastfeeding.  Any history of smoking or tobacco use. What are the risks? Generally, this is a safe procedure. However, problems may occur, including:  Infection.  Bleeding.  Blood clots.  Allergic reaction to medicines or contrast.  Damage to other structures or organs.  Kidney problems.  Increased risk of cancer. Being exposed to too much radiation over a lifetime can increase the risk of cancer. The risk is small. What happens before the procedure? Medicines Ask your health care provider about:  Changing or stopping your regular medicines. This is especially important if you are taking diabetes medicines or blood thinners.  Taking medicines such as aspirin and ibuprofen. These medicines can thin your blood. Do not take these medicines unless your health care provider tells you to take them.  Taking over-the-counter medicines, vitamins, herbs, and supplements. General instructions  Follow instructions from your health care provider about eating or drinking restrictions.  You may have blood tests  to check how well your kidneys and liver are working and how well your blood can clot.  Plan to have someone take you home from the hospital or clinic. What happens during the procedure?   An IV will be inserted into one of your veins.  You may be given a medicine to help you relax (sedative).  You will lie down on an X-ray table. The table may be tilted in different directions during the procedure to help the contrast move throughout your body. Safety straps will keep you secure if the table is tilted.  If veins in your arm or leg will be examined, a band may be wrapped around that arm or leg to keep the veins full of blood. This may cause your arm or leg to feel numb.  The contrast will be injected into your IV. You may have a hot, flushed feeling as it moves throughout your body. You may also have a metallic taste in your mouth. Both of these sensations will go away after the test is complete.  You may be asked to lie in different positions or place your legs or arms in different positions.  At the end of the procedure, you may be given IV fluids to help wash or flush the contrast out of your veins.  The IV will be removed, and pressure will be applied to the IV site to prevent bleeding. A bandage (dressing) may be applied to the IV site. The exact procedure may vary among health care providers and hospitals. What can I expect after the procedure?  Your blood pressure, heart rate, breathing rate, and blood oxygen level will be monitored until you   leave the hospital or clinic.  You may be given something to eat and drink.  You may have bruising or mild discomfort in the area where the IV was inserted. Follow these instructions at home: Eating and drinking   Follow instructions from your health care provider about eating or drinking restrictions.  Drink a lot of water for the first several days after the procedure, as directed by your health care provider. This helps to flush the  contrast out of your body. Activity  Rest as told by your health care provider.  Return to your normal activities as told by your health care provider. Ask your health care provider what activities are safe for you.  If you were given a sedative during your procedure, do not drive for 24 hours or until your health care provider approves. General instructions  Check your IV insertion area every day for signs of infection. Check for: ? Redness, swelling, or pain. ? Fluid or blood. ? Warmth. ? Pus or a bad smell.  Take over-the-counter and prescription medicines only as told by your health care provider.  Keep all follow-up visits as told by your health care provider. This is important. Contact a health care provider if:  Your skin becomes itchy or you develop a rash or hives.  You have a fever that does not get better with medicine.  You feel nauseous or you vomit.  You have redness, swelling, or pain around the insertion site.  You have fluid or blood coming from the insertion site.  Your insertion area feels warm to the touch.  You have pus or a bad smell coming from the insertion site. Get help right away if you:  Have shortness of breath or difficulty breathing.  Develop chest pain.  Faint.  Feel very dizzy. These symptoms may represent a serious problem that is an emergency. Do not wait to see if the symptoms will go away. Get medical help right away. Call your local emergency services (911 in the [place] Do not drive yourself to the hospital. Summary  A venogram, or venography, is a procedure that uses an X-ray and contrast dye to check how well the veins work and how blood flows through them.  An IV will be inserted into one of your veins in order to inject the contrast.  During the exam, you will lie on an X-ray table. The table may be tilted in different directions during the procedure to help the contrast move throughout your body. Safety straps will keep you  secure.  After the procedure, you will need to drink a lot of water to help wash or flush the contrast out of your body. This information is not intended to replace advice given to you by your health care provider. Make sure you discuss any questions you have with your health care provider. Document Revised: 01/21/2019 Document Reviewed: 01/21/2019 Elsevier Patient Education  [place].

## 2019-10-23 ENCOUNTER — Other Ambulatory Visit: Payer: Self-pay

## 2019-10-23 ENCOUNTER — Ambulatory Visit (INDEPENDENT_AMBULATORY_CARE_PROVIDER_SITE_OTHER): Payer: Medicare Other | Admitting: Pulmonary Disease

## 2019-10-23 DIAGNOSIS — J849 Interstitial pulmonary disease, unspecified: Secondary | ICD-10-CM | POA: Diagnosis not present

## 2019-10-23 DIAGNOSIS — Z5181 Encounter for therapeutic drug level monitoring: Secondary | ICD-10-CM | POA: Diagnosis not present

## 2019-10-23 NOTE — Patient Instructions (Signed)
I am glad you are stable with regard to your breathing Will order hepatic panel.  You can get this drawn tomorrow when you see Dr. Estanislado Pandy. Follow-up in 2 months.

## 2019-10-23 NOTE — Progress Notes (Signed)
Virtual Visit via Telephone Note  I connected with Kari Henson on 10/23/19 at  9:30 AM EDT by telephone and verified that I am speaking with the correct person using two identifiers.  Location: Patient: Home Provider: [facility], [place], [place]   I discussed the limitations, risks, security and privacy concerns of performing an evaluation and management service by telephone and the availability of in person appointments. I also discussed with the patient that there may be a patient responsible charge related to this service. The patient expressed understanding and agreed to proceed.   History of Present Illness: Follow-up for CTD ILD, Progressive UIP fibrosis  54 year old with history of systemic lupus erythematosus,NSIPILD. She follows with Dr. Estanislado Pandy and is on Imuran and Plaquenil. Positive ANA, DS DNA, Ro, Smith, RNP,elevated ESR, malar rash, photosensitivity, neutropenia, hypocomplementemia: Started on Imuran at 50 mg daily on December 2019.This was increased to 50 mg twice daily in November 2020 due to elevated sed rate.  States that breathing is stable with chronic cough, no fevers, chills   Observations/Objective: Started on ofev 07/24/19. Has mild stomach upset but is able to tolerate it well. Evaluated by cardiology for pulmonary hypertension on echo Right heart cath was normal with no elevation of pressures.  She has been taken off Lasix this week due to low potassium. Dyspnea is stable  Echo 09/21/19-  LVEF 55-97%, grade 1 diastolic dysfunction, mildly elevated PA systolic pressure  RHC 10/12/36 Normal right heart pressures Normal cardiac output No evidence of pulmonary hypertension  PA pressure 28/9 with mean of 19 mmHg RA pressure 3 with mean of 0 mmHg Pulmonary capillary wedge pressure A-wave 5, V wave 4, mean 3 mmHg Fick cardiac output 5.4 L/min   Assessment and Plan: CTD ILD, UIP fibrosis Continue Imuran per Dr. Estanislado Pandy,  rheumatology Started on Ofev Check hepatic panel  Pulm HTN Echo reviewed with mild pulmonary hypertension However right heart cath was normal.  Follow Up Instructions: Continue Ofev, Imuran Check hepatic panel.   I discussed the assessment and treatment plan with the patient. The patient was provided an opportunity to ask questions and all were answered. The patient agreed with the plan and demonstrated an understanding of the instructions.   The patient was advised to call back or seek an in-person evaluation if the symptoms worsen or if the condition fails to improve as anticipated.  I provided 25 minutes of non-face-to-face time during this encounter.  Marshell Garfinkel MD [facility] 10/23/2019, 9:40 AM

## 2019-10-23 NOTE — Addendum Note (Signed)
Addended by: Elton Sin on: 10/23/2019 09:55 AM   Modules accepted: Orders

## 2019-10-24 ENCOUNTER — Ambulatory Visit (INDEPENDENT_AMBULATORY_CARE_PROVIDER_SITE_OTHER): Payer: Medicare Other | Admitting: Physician Assistant

## 2019-10-24 ENCOUNTER — Encounter: Payer: Self-pay | Admitting: Physician Assistant

## 2019-10-24 VITALS — BP 141/82 | HR 121 | Resp 16 | Ht 62.0 in | Wt 129.4 lb

## 2019-10-24 DIAGNOSIS — J849 Interstitial pulmonary disease, unspecified: Secondary | ICD-10-CM

## 2019-10-24 DIAGNOSIS — M3219 Other organ or system involvement in systemic lupus erythematosus: Secondary | ICD-10-CM | POA: Diagnosis not present

## 2019-10-24 DIAGNOSIS — Z79899 Other long term (current) drug therapy: Secondary | ICD-10-CM | POA: Diagnosis not present

## 2019-10-24 DIAGNOSIS — R5383 Other fatigue: Secondary | ICD-10-CM | POA: Diagnosis not present

## 2019-10-24 DIAGNOSIS — R683 Clubbing of fingers: Secondary | ICD-10-CM

## 2019-10-24 DIAGNOSIS — D472 Monoclonal gammopathy: Secondary | ICD-10-CM

## 2019-10-24 DIAGNOSIS — E559 Vitamin D deficiency, unspecified: Secondary | ICD-10-CM | POA: Diagnosis not present

## 2019-10-24 DIAGNOSIS — L930 Discoid lupus erythematosus: Secondary | ICD-10-CM | POA: Diagnosis not present

## 2019-10-24 DIAGNOSIS — J8410 Pulmonary fibrosis, unspecified: Secondary | ICD-10-CM

## 2019-10-24 DIAGNOSIS — Z87891 Personal history of nicotine dependence: Secondary | ICD-10-CM

## 2019-10-26 ENCOUNTER — Other Ambulatory Visit (HOSPITAL_COMMUNITY)
Admission: RE | Admit: 2019-10-26 | Discharge: 2019-10-26 | Disposition: A | Discharge status: Home / Self Care | Payer: Medicare Other | Source: Ambulatory Visit | Attending: Cardiovascular Disease | Admitting: Cardiovascular Disease

## 2019-10-26 ENCOUNTER — Other Ambulatory Visit: Payer: Self-pay

## 2019-10-26 DIAGNOSIS — Z79899 Other long term (current) drug therapy: Secondary | ICD-10-CM | POA: Insufficient documentation

## 2019-10-26 LAB — BASIC METABOLIC PANEL
Anion gap: 13 (ref 5–15)
BUN: 7 mg/dL (ref 6–20)
CO2: 27 mmol/L (ref 22–32)
Calcium: 12.7 mg/dL — ABNORMAL HIGH (ref 8.9–10.3)
Chloride: 89 mmol/L — ABNORMAL LOW (ref 98–111)
Creatinine, Ser: 0.69 mg/dL (ref 0.44–1.00)
GFR calc Af Amer: >60 mL/min (ref >60)
GFR calc non Af Amer: >60 mL/min (ref >60)
Glucose, Bld: 88 mg/dL (ref 70–99)
Potassium: 3.5 mmol/L (ref 3.5–5.1)
Sodium: 129 mmol/L — ABNORMAL LOW (ref 135–145)

## 2019-10-30 ENCOUNTER — Telehealth: Payer: Self-pay

## 2019-10-30 DIAGNOSIS — Z79899 Other long term (current) drug therapy: Secondary | ICD-10-CM

## 2019-10-30 NOTE — Telephone Encounter (Signed)
Pt has stopped lasix, will repeat bmet on 11/02/19

## 2019-10-30 NOTE — Telephone Encounter (Signed)
-----  Message from Herminio Commons, MD sent at 10/27/2019 10:36 AM EDT ----- Sodium is low. Stop Lasix. Repeat BMET in a week. This was prescribed by pulmonary.

## 2019-10-31 ENCOUNTER — Encounter: Payer: Self-pay | Admitting: Rheumatology

## 2019-10-31 DIAGNOSIS — J849 Interstitial pulmonary disease, unspecified: Secondary | ICD-10-CM | POA: Diagnosis not present

## 2019-10-31 DIAGNOSIS — L930 Discoid lupus erythematosus: Secondary | ICD-10-CM | POA: Diagnosis not present

## 2019-10-31 DIAGNOSIS — D8989 Other specified disorders involving the immune mechanism, not elsewhere classified: Secondary | ICD-10-CM | POA: Diagnosis not present

## 2019-10-31 DIAGNOSIS — M3219 Other organ or system involvement in systemic lupus erythematosus: Secondary | ICD-10-CM | POA: Diagnosis not present

## 2019-11-02 ENCOUNTER — Telehealth: Payer: Self-pay | Admitting: *Deleted

## 2019-11-02 ENCOUNTER — Other Ambulatory Visit: Payer: Self-pay

## 2019-11-02 ENCOUNTER — Inpatient Hospital Stay (HOSPITAL_COMMUNITY)
Admission: EM | Admit: 2019-11-02 | Discharge: 2019-11-06 | DRG: 640 | Disposition: A | Discharge status: Home Health | Payer: Medicare Other | Source: Ambulatory Visit | Attending: Family Medicine | Admitting: Family Medicine

## 2019-11-02 ENCOUNTER — Encounter (HOSPITAL_COMMUNITY): Payer: Self-pay | Admitting: Emergency Medicine

## 2019-11-02 ENCOUNTER — Other Ambulatory Visit (HOSPITAL_COMMUNITY)
Admission: RE | Admit: 2019-11-02 | Discharge: 2019-11-02 | Disposition: A | Discharge status: Home / Self Care | Payer: Medicare Other | Source: Ambulatory Visit | Attending: Cardiovascular Disease | Admitting: Cardiovascular Disease

## 2019-11-02 ENCOUNTER — Telehealth: Payer: Self-pay | Admitting: Rheumatology

## 2019-11-02 ENCOUNTER — Emergency Department (HOSPITAL_COMMUNITY): Payer: Medicare Other

## 2019-11-02 DIAGNOSIS — Z87891 Personal history of nicotine dependence: Secondary | ICD-10-CM | POA: Diagnosis not present

## 2019-11-02 DIAGNOSIS — R000 Tachycardia, unspecified: Secondary | ICD-10-CM | POA: Diagnosis not present

## 2019-11-02 DIAGNOSIS — Z8261 Family history of arthritis: Secondary | ICD-10-CM | POA: Diagnosis not present

## 2019-11-02 DIAGNOSIS — Z825 Family history of asthma and other chronic lower respiratory diseases: Secondary | ICD-10-CM | POA: Diagnosis not present

## 2019-11-02 DIAGNOSIS — Z79899 Other long term (current) drug therapy: Secondary | ICD-10-CM | POA: Insufficient documentation

## 2019-11-02 DIAGNOSIS — N179 Acute kidney failure, unspecified: Secondary | ICD-10-CM | POA: Diagnosis present

## 2019-11-02 DIAGNOSIS — E559 Vitamin D deficiency, unspecified: Secondary | ICD-10-CM | POA: Diagnosis present

## 2019-11-02 DIAGNOSIS — Z8249 Family history of ischemic heart disease and other diseases of the circulatory system: Secondary | ICD-10-CM | POA: Diagnosis not present

## 2019-11-02 DIAGNOSIS — I2729 Other secondary pulmonary hypertension: Secondary | ICD-10-CM | POA: Diagnosis present

## 2019-11-02 DIAGNOSIS — D508 Other iron deficiency anemias: Secondary | ICD-10-CM | POA: Diagnosis present

## 2019-11-02 DIAGNOSIS — J849 Interstitial pulmonary disease, unspecified: Secondary | ICD-10-CM | POA: Diagnosis present

## 2019-11-02 DIAGNOSIS — R634 Abnormal weight loss: Secondary | ICD-10-CM | POA: Diagnosis present

## 2019-11-02 DIAGNOSIS — E876 Hypokalemia: Secondary | ICD-10-CM | POA: Diagnosis not present

## 2019-11-02 DIAGNOSIS — Z6823 Body mass index (BMI) 23.0-23.9, adult: Secondary | ICD-10-CM

## 2019-11-02 DIAGNOSIS — Z833 Family history of diabetes mellitus: Secondary | ICD-10-CM | POA: Diagnosis not present

## 2019-11-02 DIAGNOSIS — J9601 Acute respiratory failure with hypoxia: Secondary | ICD-10-CM | POA: Diagnosis not present

## 2019-11-02 DIAGNOSIS — Z20822 Contact with and (suspected) exposure to covid-19: Secondary | ICD-10-CM | POA: Diagnosis present

## 2019-11-02 DIAGNOSIS — I10 Essential (primary) hypertension: Secondary | ICD-10-CM | POA: Diagnosis present

## 2019-11-02 DIAGNOSIS — M329 Systemic lupus erythematosus, unspecified: Secondary | ICD-10-CM | POA: Diagnosis present

## 2019-11-02 DIAGNOSIS — J84112 Idiopathic pulmonary fibrosis: Secondary | ICD-10-CM | POA: Diagnosis present

## 2019-11-02 DIAGNOSIS — R0600 Dyspnea, unspecified: Secondary | ICD-10-CM

## 2019-11-02 DIAGNOSIS — Z8349 Family history of other endocrine, nutritional and metabolic diseases: Secondary | ICD-10-CM | POA: Diagnosis not present

## 2019-11-02 DIAGNOSIS — J8410 Pulmonary fibrosis, unspecified: Secondary | ICD-10-CM | POA: Diagnosis not present

## 2019-11-02 DIAGNOSIS — K219 Gastro-esophageal reflux disease without esophagitis: Secondary | ICD-10-CM | POA: Diagnosis present

## 2019-11-02 DIAGNOSIS — D472 Monoclonal gammopathy: Secondary | ICD-10-CM | POA: Diagnosis present

## 2019-11-02 DIAGNOSIS — E860 Dehydration: Secondary | ICD-10-CM | POA: Diagnosis present

## 2019-11-02 DIAGNOSIS — R05 Cough: Secondary | ICD-10-CM | POA: Diagnosis not present

## 2019-11-02 DIAGNOSIS — R0689 Other abnormalities of breathing: Secondary | ICD-10-CM

## 2019-11-02 LAB — CBC
HCT: 38.8 % (ref 36.0–46.0)
Hemoglobin: 12.5 g/dL (ref 12.0–15.0)
MCH: 27.5 pg (ref 26.0–34.0)
MCHC: 32.2 g/dL (ref 30.0–36.0)
MCV: 85.3 fL (ref 80.0–100.0)
Platelets: 305 10*3/uL (ref 150–400)
RBC: 4.55 MIL/uL (ref 3.87–5.11)
RDW: 16.1 % — ABNORMAL HIGH (ref 11.5–15.5)
WBC: 8.3 10*3/uL (ref 4.0–10.5)
nRBC: 0 % (ref 0.0–0.2)

## 2019-11-02 LAB — COMPREHENSIVE METABOLIC PANEL
ALT: 15 U/L (ref 0–44)
AST: 45 U/L — ABNORMAL HIGH (ref 15–41)
Albumin: 3.6 g/dL (ref 3.5–5.0)
Alkaline Phosphatase: 102 U/L (ref 38–126)
Anion gap: 10 (ref 5–15)
BUN: 20 mg/dL (ref 6–20)
CO2: 29 mmol/L (ref 22–32)
Calcium: >15 mg/dL (ref 8.9–10.3)
Chloride: 89 mmol/L — ABNORMAL LOW (ref 98–111)
Creatinine, Ser: 1.31 mg/dL — ABNORMAL HIGH (ref 0.44–1.00)
GFR calc Af Amer: 53 mL/min — ABNORMAL LOW (ref >60)
GFR calc non Af Amer: 46 mL/min — ABNORMAL LOW (ref >60)
Glucose, Bld: 101 mg/dL — ABNORMAL HIGH (ref 70–99)
Potassium: 3.7 mmol/L (ref 3.5–5.1)
Sodium: 128 mmol/L — ABNORMAL LOW (ref 135–145)
Total Bilirubin: 0.6 mg/dL (ref 0.3–1.2)
Total Protein: 7.9 g/dL (ref 6.5–8.1)

## 2019-11-02 LAB — BASIC METABOLIC PANEL
Anion gap: 10 (ref 5–15)
BUN: 18 mg/dL (ref 6–20)
CO2: 29 mmol/L (ref 22–32)
Calcium: >15 mg/dL (ref 8.9–10.3)
Chloride: 92 mmol/L — ABNORMAL LOW (ref 98–111)
Creatinine, Ser: 1.39 mg/dL — ABNORMAL HIGH (ref 0.44–1.00)
GFR calc Af Amer: 50 mL/min — ABNORMAL LOW (ref >60)
GFR calc non Af Amer: 43 mL/min — ABNORMAL LOW (ref >60)
Glucose, Bld: 114 mg/dL — ABNORMAL HIGH (ref 70–99)
Potassium: 3.7 mmol/L (ref 3.5–5.1)
Sodium: 131 mmol/L — ABNORMAL LOW (ref 135–145)

## 2019-11-02 LAB — VITAMIN D 25 HYDROXY (VIT D DEFICIENCY, FRACTURES): Vit D, 25-Hydroxy: 44.14 ng/mL (ref 30–100)

## 2019-11-02 LAB — RESPIRATORY PANEL BY RT PCR (FLU A&B, COVID)
Influenza A by PCR: NEGATIVE
Influenza B by PCR: NEGATIVE
SARS Coronavirus 2 by RT PCR: NEGATIVE

## 2019-11-02 MED ORDER — ACETAMINOPHEN 325 MG PO TABS: 650.0000 mg | ORAL_TABLET | Freq: Four times a day (QID) | ORAL | Status: DC | PRN

## 2019-11-02 MED ORDER — AZATHIOPRINE 50 MG PO TABS
50.0000 mg | ORAL_TABLET | Freq: Two times a day (BID) | ORAL | Status: DC
  Administered 2019-11-02 – 2019-11-06 (×8): 50 mg via ORAL
  Filled 2019-11-02 (×12): qty 1

## 2019-11-02 MED ORDER — LORATADINE 10 MG PO TABS
10.0000 mg | ORAL_TABLET | Freq: Every day | ORAL | Status: DC
  Administered 2019-11-02 – 2019-11-06 (×5): 10 mg via ORAL
  Filled 2019-11-02 (×5): qty 1

## 2019-11-02 MED ORDER — PNEUMOCOCCAL VAC POLYVALENT 25 MCG/0.5ML IJ INJ: 0.5000 mL | INJECTION | INTRAMUSCULAR | Status: DC

## 2019-11-02 MED ORDER — ADULT MULTIVITAMIN W/MINERALS CH
1.0000 | ORAL_TABLET | Freq: Every day | ORAL | Status: DC
  Administered 2019-11-02 – 2019-11-06 (×5): 1 via ORAL
  Filled 2019-11-02 (×5): qty 1

## 2019-11-02 MED ORDER — PANTOPRAZOLE SODIUM 40 MG PO TBEC
40.0000 mg | DELAYED_RELEASE_TABLET | Freq: Every day | ORAL | Status: DC
  Administered 2019-11-02 – 2019-11-06 (×5): 40 mg via ORAL
  Filled 2019-11-02 (×5): qty 1

## 2019-11-02 MED ORDER — SODIUM CHLORIDE 0.9 % IV SOLN: INTRAVENOUS | Status: DC

## 2019-11-02 MED ORDER — HYDROXYCHLOROQUINE SULFATE 200 MG PO TABS
200.0000 mg | ORAL_TABLET | Freq: Two times a day (BID) | ORAL | Status: DC
  Administered 2019-11-02 – 2019-11-06 (×8): 200 mg via ORAL
  Filled 2019-11-02 (×8): qty 1

## 2019-11-02 MED ORDER — ZOLEDRONIC ACID 4 MG/5ML IV CONC
4.0000 mg | Freq: Once | INTRAVENOUS | Status: AC
  Administered 2019-11-02: 4 mg via INTRAVENOUS
  Filled 2019-11-02: qty 5

## 2019-11-02 MED ORDER — FERROUS SULFATE 325 (65 FE) MG PO TABS
324.0000 mg | ORAL_TABLET | Freq: Every day | ORAL | Status: DC
  Administered 2019-11-03: 324 mg via ORAL
  Filled 2019-11-02 (×2): qty 1

## 2019-11-02 MED ORDER — ACETAMINOPHEN 650 MG RE SUPP: 650.0000 mg | Freq: Four times a day (QID) | RECTAL | Status: DC | PRN

## 2019-11-02 MED ORDER — ONDANSETRON HCL 4 MG PO TABS: 4.0000 mg | ORAL_TABLET | Freq: Four times a day (QID) | ORAL | Status: DC | PRN

## 2019-11-02 MED ORDER — SODIUM CHLORIDE 0.9 % IV BOLUS
1000.0000 mL | Freq: Once | INTRAVENOUS | Status: AC
  Administered 2019-11-02: 1000 mL via INTRAVENOUS

## 2019-11-02 MED ORDER — NINTEDANIB ESYLATE 150 MG PO CAPS
150.0000 mg | ORAL_CAPSULE | Freq: Two times a day (BID) | ORAL | Status: DC
  Administered 2019-11-03 – 2019-11-06 (×6): 150 mg via ORAL
  Filled 2019-11-02: qty 1

## 2019-11-02 MED ORDER — HEPARIN SODIUM (PORCINE) 5000 UNIT/ML IJ SOLN
5000.0000 [IU] | Freq: Three times a day (TID) | INTRAMUSCULAR | Status: DC
  Administered 2019-11-02 – 2019-11-06 (×12): 5000 [IU] via SUBCUTANEOUS
  Filled 2019-11-02 (×11): qty 1

## 2019-11-02 MED ORDER — ONDANSETRON HCL 4 MG/2ML IJ SOLN: 4.0000 mg | Freq: Four times a day (QID) | INTRAMUSCULAR | Status: DC | PRN

## 2019-11-02 NOTE — H&P (Addendum)
History and Physical    ELIESE KERWOOD ZJQ:[id number] DOB: 24-Mar-1966 DOA: 11/02/2019  PCP: [name], NP   Patient coming from: Home  Chief Complaint: Bilateral lower extremity weakness, intermittent along with abnormal labs  HPI: Kari Henson is a 54 y.o. female with medical history significant for SLE, ILD with mild pulmonary hypertension, GERD, MGUS, and vitamin D deficiency who has been having bilateral intermittent lower extremity weakness over the last [redacted] weeks along with some mild hyperkalemia.  She has also had some weight loss of approximately 20 pounds in the last 1 month, as she has not eaten very much.  No night sweats or fevers noted.  She has had some occasional cough, but no chest pain or shortness of breath and no abdominal pain.  She has not been more confused either.  She was told to come to the ED due to her abnormal lab work with hypercalcemia noted by her cardiologist today.   ED Course: Vital signs demonstrate sinus tachycardia.  Her calcium levels are greater than 15 and are noted to be 14.8 after adjustment with her albumin.  Serum sodium is 131 and creatinine is 1.39 with baseline creatinine 0.5-0.6.  Chest x-ray with findings of fibrosis and no other acute findings noted.  2 L IV fluid bolus has been given and PTH has been ordered.  Review of Systems: All others reviewed as noted above and otherwise negative.  Past Medical History:  Diagnosis Date  . GERD (gastroesophageal reflux disease)   . Lung fibrosis ([place])    follows with a Dr. Ouida Sills, pulmonary in [place], [place]  . 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 Seasonal allergies   . SLE (systemic lupus erythematosus) ([place]) 2010   she was on MTX (d/c due to cytopenia).  She has been on Prednisone and Plaquenil    Past Surgical History:  Procedure Laterality Date  . CESAREAN SECTION    . COLONOSCOPY N/A 04/27/2014   SLF:  1. one colon polyp removed 2 The left colonis redundant 3. the  examined terminal ileum apperared to be  normal 3. small internal hemorrohoids.  . ESOPHAGOGASTRODUODENOSCOPY N/A 04/27/2014   SLF: 1. No obvious source for abdominal pain identified. 2. Non-erosive gastritis (inflammation) was found in the gastric antrum; multiple biopsies were performed.  Marland Kitchen HERNIA REPAIR    . MULTIPLE TOOTH EXTRACTIONS    . RIGHT HEART CATH N/A 10/20/2019   Procedure: RIGHT HEART CATH;  Surgeon: Sherren Mocha, MD;  Location: [facility];  Service: Cardiovascular;  Laterality: N/A;     reports that she quit smoking about 8 years ago. Her smoking use included cigarettes. She has a 56.00 pack-year smoking history. She has never used smokeless tobacco. She reports that she does not drink alcohol or use drugs.  No Known Allergies  Family History  Problem Relation Age of Onset  . Asthma Mother   . Thyroid disease Mother   . Hypertension Mother   . Cirrhosis Father   . Alcohol abuse Father   . Diabetes Father   . COPD Father   . Diabetes Maternal Aunt   . Hypertension Sister   . Healthy Brother   . Diabetes Maternal Uncle   . Diabetes Paternal Aunt   . Hypertension Maternal Grandmother   . COPD Maternal Grandmother   . Arthritis Maternal Grandmother        rheumatoid arthritis  . Healthy Son   . Colon cancer Neg Hx     Prior  to Admission medications   Medication Sig Start Date End Date Taking? Authorizing Provider  azaTHIOprine (IMURAN) 50 MG tablet TAKE 2 TABLETS BY MOUTH EVERY DAY Patient taking differently: Take 50 mg by mouth in the morning and at bedtime.  10/18/19  Yes Ofilia Neas, PA-C  ferrous sulfate 324 MG TBEC Take 324 mg by mouth daily with breakfast.   Yes [provider]  hydroxychloroquine (PLAQUENIL) 200 MG tablet Take 1 tablet (200 mg total) by mouth 2 (two) times daily. 09/18/19  Yes Deveshwar, Abel Presto, MD  loratadine (CLARITIN) 10 MG tablet Take 10 mg by mouth daily.   Yes [provider]  Multiple Vitamin  (MULTIVITAMIN WITH MINERALS) TABS tablet Take 1 tablet by mouth daily.   Yes [provider]  Nintedanib (OFEV) 150 MG CAPS Take 150 mg by mouth 2 (two) times daily. q12 hrs   Yes [provider]  pantoprazole (PROTONIX) 40 MG tablet Take 40 mg by mouth daily.    Yes [provider]  potassium chloride SA (KLOR-CON) 20 MEQ tablet Take 1 tablet (20 mEq total) by mouth daily. 10/18/19  Yes Herminio Commons, MD  Vitamin D, Ergocalciferol, (DRISDOL) 50000 units CAPS capsule Take 50,000 Units by mouth every 7 (seven) days. Saturday or Sunday   Yes [provider]  acetaminophen (TYLENOL) 500 MG tablet Take 500-1,000 mg by mouth every 6 (six) hours as needed for mild pain or moderate pain.     [provider]    Physical Exam: Vitals:   11/02/19 1409 11/02/19 1422 11/02/19 1430 11/02/19 1515  BP: (!) 150/91  127/86   Pulse: (!) 122 (!) 110 (!) 105 (!) 102  Resp: (!) 36 (!) 44 (!) 37 (!) 32  Temp:      TempSrc:      SpO2: (!) 89% 93% 91% 93%  Weight:      Height:        Constitutional: NAD, calm, comfortable Vitals:   11/02/19 1409 11/02/19 1422 11/02/19 1430 11/02/19 1515  BP: (!) 150/91  127/86   Pulse: (!) 122 (!) 110 (!) 105 (!) 102  Resp: (!) 36 (!) 44 (!) 37 (!) 32  Temp:      TempSrc:      SpO2: (!) 89% 93% 91% 93%  Weight:      Height:       Eyes: lids and conjunctivae normal ENMT: Mucous membranes are moist.  Neck: normal, supple Respiratory: clear to auscultation bilaterally. Normal respiratory effort. No accessory muscle use.  Cardiovascular: Regular rate and rhythm, no murmurs. No extremity edema. Abdomen: no tenderness, no distention. Bowel sounds positive.  Musculoskeletal:  No joint deformity upper and lower extremities.   Skin: no rashes, lesions, ulcers.  Psychiatric: Normal judgment and insight. Alert and oriented x 3. Normal mood.   Labs on Admission: I have personally reviewed following labs and imaging  studies  CBC: Recent Labs  Lab 11/02/19 1443  WBC 8.3  HGB 12.5  HCT 38.8  MCV 85.3  PLT 277   Basic Metabolic Panel: Recent Labs  Lab 11/02/19 0938  NA 131*  K 3.7  CL 92*  CO2 29  GLUCOSE 114*  BUN 18  CREATININE 1.39*  CALCIUM >15.0*   GFR: Estimated Creatinine Clearance: 36.6 mL/min (A) (by C-G formula based on SCr of 1.39 mg/dL (H)). Liver Function Tests: No results for input(s): AST, ALT, ALKPHOS, BILITOT, PROT, ALBUMIN in the last 168 hours. No results for input(s): LIPASE, AMYLASE in the  last 168 hours. No results for input(s): AMMONIA in the last 168 hours. Coagulation Profile: No results for input(s): INR, PROTIME in the last 168 hours. Cardiac Enzymes: No results for input(s): CKTOTAL, CKMB, CKMBINDEX, TROPONINI in the last 168 hours. BNP (last 3 results) No results for input(s): PROBNP in the last 8760 hours. HbA1C: No results for input(s): HGBA1C in the last 72 hours. CBG: No results for input(s): GLUCAP in the last 168 hours. Lipid Profile: No results for input(s): CHOL, HDL, LDLCALC, TRIG, CHOLHDL, LDLDIRECT in the last 72 hours. Thyroid Function Tests: No results for input(s): TSH, T4TOTAL, FREET4, T3FREE, THYROIDAB in the last 72 hours. Anemia Panel: No results for input(s): VITAMINB12, FOLATE, FERRITIN, TIBC, IRON, RETICCTPCT in the last 72 hours. Urine analysis:    Component Value Date/Time   COLORURINE YELLOW 05/10/2018 1534   APPEARANCEUR Clear 06/16/2019 1430   LABSPEC 1.014 05/10/2018 1534   PHURINE 6.5 05/10/2018 1534   GLUCOSEU Negative 06/16/2019 1430   HGBUR NEGATIVE 05/10/2018 1534   BILIRUBINUR Negative 06/16/2019 1430   KETONESUR NEGATIVE 05/10/2018 1534   PROTEINUR Negative 06/16/2019 1430   PROTEINUR NEGATIVE 05/10/2018 1534   UROBILINOGEN 0.2 02/14/2014 1952   NITRITE Negative 06/16/2019 1430   NITRITE NEGATIVE 05/10/2018 1534   LEUKOCYTESUR 1+ (A) 06/16/2019 1430    Radiological Exams on Admission: DG Chest Port 1  View  Result Date: 11/02/2019 CLINICAL DATA:  Cough EXAM: PORTABLE CHEST 1 VIEW COMPARISON:  04/14/2019, CT chest 06/21/2019 FINDINGS: Extensive bilateral pulmonary fibrosis, basilar predominant. New patchy bilateral somewhat nodular appearing foci of airspace disease throughout the lungs. Stable cardiomediastinal silhouette. No pneumothorax. IMPRESSION: Extensive pulmonary fibrosis. New patchy bilateral slightly nodular appearing foci of airspace disease suspect for acute superimposed bilateral pneumonia, possibly atypical etiology. Electronically Signed   By: Donavan Foil M.D.   On: 11/02/2019 15:12    EKG: Independently reviewed. 112bpm ST.  Assessment/Plan Active Problems:   Hypercalcemia    Symptomatic hypercalcemia -Associated with generalized weakness, but with no altered mentation -Further work-up pending with PTH -Corrected calcium level appears to be 14.8, ionized calcium level ordered -PTH RP, and vitamin D levels ordered -Treatment with aggressive IV fluid for now and then plan to add Lasix for diuresis once kidney function is improved -We will add Zometa x1 dose -Appears to have vitamin D deficiency with no calcium supplementation noted at home -Chest x-ray with fibrosis and patchy foci noted.  Prior CT chest high-resolution performed on 05/2019 with findings of ILD and no evidence of sarcoidosis at that time.  AKI -Baseline creatinine 0.5-0.6 -Plan to hydrate with IV fluid aggressively given hypercalcemia -Check strict I's and O's -Repeat BMP -Check urine electrolytes  History of SLE -Continue Plaquenil and Imuran  History of ILD with pulmonary hypertension -Mildly elevated PA pressures on echocardiogram recently noted with normal right heart catheterization -Under the care of Dr. [name] outpatient -Continue Ofev  History of vitamin D deficiency -Continue supplementation at home -Will check levels here as noted above -Bone density study normal in  2018  MGUS -Follow-up with rheumatology outpatient   DVT prophylaxis: Heparin Code Status: Full Family Communication: None at bedside Disposition Plan:Hypercalcemia workup and AKI management Consults called:None Admission status: Inpatient, Tele Status is: Inpatient  Remains inpatient appropriate because:Persistent severe electrolyte disturbances and IV treatments appropriate due to intensity of illness or inability to take PO   Dispo: The patient is from: Home              Anticipated d/c is to:  Home              Anticipated d/c date is: 1 day              Patient currently is not medically stable to d/c.   Analucia Hush D Manuella Ghazi DO [facility]  If 7PM-7AM, please contact night-coverage [email or web address]  11/02/2019, 3:26 PM

## 2019-11-02 NOTE — Telephone Encounter (Signed)
Attempted to contact the patient and left message for patient to call the office.  

## 2019-11-02 NOTE — ED Provider Notes (Signed)
[facility] Emergency Department Provider Note MRN:  [id number]  Arrival date & time: 11/02/19     Chief Complaint   Extremity Weakness   History of Present Illness   Kari Henson is a 54 y.o. year-old female with a history of lupus presenting to the ED with chief complaint of extremity weakness.  Patient is endorsing bilateral lower extremity weakness intermittently for the past several months.  She is also endorsing unintentional weight loss.  She denies night sweats, no fever.  Intermittent occasional cough, no chest pain or shortness of breath, no abdominal pain.  She was told to come to the emergency department because her labs are abnormal.  Review of Systems  A complete 10 system review of systems was obtained and all systems are negative except as noted in the HPI and PMH.   Patient's Health History    Past Medical History:  Diagnosis Date  . GERD (gastroesophageal reflux disease)   . Lung fibrosis ([place])    follows with a Dr. Ouida Sills, pulmonary in [place], [place]  . 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 Seasonal allergies   . SLE (systemic lupus erythematosus) ([place]) 2010   she was on MTX (d/c due to cytopenia).  She has been on Prednisone and Plaquenil    Past Surgical History:  Procedure Laterality Date  . CESAREAN SECTION    . COLONOSCOPY N/A 04/27/2014   SLF:  1. one colon polyp removed 2 The left colonis redundant 3. the examined terminal ileum apperared to be  normal 3. small internal hemorrohoids.  . ESOPHAGOGASTRODUODENOSCOPY N/A 04/27/2014   SLF: 1. No obvious source for abdominal pain identified. 2. Non-erosive gastritis (inflammation) was found in the gastric antrum; multiple biopsies were performed.  Marland Kitchen HERNIA REPAIR    . MULTIPLE TOOTH EXTRACTIONS    . RIGHT HEART CATH N/A 10/20/2019   Procedure: RIGHT HEART CATH;  Surgeon: Sherren Mocha, MD;  Location: [facility];  Service: Cardiovascular;   Laterality: N/A;    Family History  Problem Relation Age of Onset  . Asthma Mother   . Thyroid disease Mother   . Hypertension Mother   . Cirrhosis Father   . Alcohol abuse Father   . Diabetes Father   . COPD Father   . Diabetes Maternal Aunt   . Hypertension Sister   . Healthy Brother   . Diabetes Maternal Uncle   . Diabetes Paternal Aunt   . Hypertension Maternal Grandmother   . COPD Maternal Grandmother   . Arthritis Maternal Grandmother        rheumatoid arthritis  . Healthy Son   . Colon cancer Neg Hx     Social History   Socioeconomic History  . Marital status: Single    Spouse name: Not on file  . Number of children: 1  . Years of education: Not on file  . Highest education level: Not on file  Occupational History    Comment: used to work in [employer]; now applying for disability due to SLE  Tobacco Use  . Smoking status: Former Smoker    Packs/day: 2.00    Years: 28.00    Pack years: 56.00    Types: Cigarettes    Quit date: 08/31/2011    Years since quitting: 8.1  . Smokeless tobacco: Never Used  Substance and Sexual Activity  . Alcohol use: No    Alcohol/week: 0.0 standard drinks  . Drug use: Never  . Sexual activity: Yes  Other  Topics Concern  . Not on file  Social History Narrative  . Not on file   Social Determinants of Health   Financial Resource Strain:   . Difficulty of Paying Living Expenses:   Food Insecurity:   . Worried About Charity fundraiser in the Last Year:   . Arboriculturist in the Last Year:   Transportation Needs:   . Film/video editor (Medical):   Marland Kitchen Lack of Transportation (Non-Medical):   Physical Activity:   . Days of Exercise per Week:   . Minutes of Exercise per Session:   Stress:   . Feeling of Stress :   Social Connections:   . Frequency of Communication with Friends and Family:   . Frequency of Social Gatherings with Friends and Family:   . Attends Religious Services:   . Active Member of Clubs or Organizations:    . Attends Archivist Meetings:   Marland Kitchen Marital Status:   Intimate Partner Violence:   . Fear of Current or Ex-Partner:   . Emotionally Abused:   Marland Kitchen Physically Abused:   . Sexually Abused:      Physical Exam   Vitals:   11/02/19 1422 11/02/19 1430  BP:  127/86  Pulse: (!) 110 (!) 105  Resp: (!) 44 (!) 37  Temp:    SpO2: 93% 91%    CONSTITUTIONAL: Well-appearing, NAD NEURO:  Alert and oriented x 3, normal and symmetric strength and sensation, normal coordination, normal speech EYES:  eyes equal and reactive ENT/NECK:  no LAD, no JVD, no palpable supraclavicular lymph nodes, thyroid palpated and feels normal CARDIO: Regular rate, well-perfused, normal S1 and S2 PULM:  CTAB no wheezing or rhonchi GI/GU:  normal bowel sounds, non-distended, non-tender MSK/SPINE:  No gross deformities, no edema SKIN:  no rash, atraumatic PSYCH:  Appropriate speech and behavior  *Additional and/or pertinent findings included in MDM below  Diagnostic and Interventional Summary    EKG Interpretation  Date/Time:  Thursday Nov 02 2019 14:11:31 EDT Ventricular Rate:  112 PR Interval:    QRS Duration: 90 QT Interval:  308 QTC Calculation: 421 R Axis:   86 Text Interpretation: Sinus tachycardia Borderline repolarization abnormality Confirmed by Gerlene Fee [phone]) on 11/02/2019 2:56:41 PM      Labs Reviewed  RESPIRATORY PANEL BY RT PCR (FLU A&B, COVID)  CBC  COMPREHENSIVE METABOLIC PANEL  PARATHYROID HORMONE, INTACT (NO CA)  I-STAT BETA HCG BLOOD, ED (MC, [facility], AP ONLY)    DG Chest Port 1 View    (Results Pending)    Medications  sodium chloride 0.9 % bolus 1,000 mL (has no administration in time range)  sodium chloride 0.9 % bolus 1,000 mL (1,000 mLs Intravenous New Bag/Given 11/02/19 1449)     Procedures  /  Critical Care .Critical Care Performed by: Maudie Flakes, MD Authorized by: Maudie Flakes, MD   Critical care provider statement:    Critical care time (minutes):   32   Critical care was necessary to treat or prevent imminent or life-threatening deterioration of the following conditions: profound hypercalcemia.   Critical care was time spent personally by me on the following activities:  Discussions with consultants, evaluation of patient's response to treatment, examination of patient, ordering and performing treatments and interventions, ordering and review of laboratory studies, ordering and review of radiographic studies, pulse oximetry, re-evaluation of patient's condition, obtaining history from patient or surrogate and review of old charts    ED Course and Medical Decision  Making  I have reviewed the triage vital signs, the nursing notes, and pertinent available records from the EMR.  Listed above are laboratory and imaging tests that I personally ordered, reviewed, and interpreted and then considered in my medical decision making (see below for details).      Critical hypercalcemia, though patient is surprisingly alert, awake, looks well.  Unclear etiology, considering underlying malignancy, patient denies excessive use of vitamin D, mild cough will obtain screening x-ray to evaluate for granulomatous conditions.  Will also obtain parathyroid hormone.  Will provide 2 L crystalloid and admit to hospital service for further care.    Barth Kirks. [place]onia Small, [place] [email or web address]  Final Clinical Impressions(s) / ED Diagnoses     ICD-10-CM   1. Hypercalcemia  E83.52     ED Discharge Orders    None       Discharge Instructions Discussed with and Provided to Patient:   Discharge Instructions   None       Maudie Flakes, MD 11/02/19 [phone]

## 2019-11-02 NOTE — ED Triage Notes (Signed)
Patient c/o leg weakness for the past several months. Patient was seen at heart care this morning and was advised to come to the ED for further evaluation due to an abnormal lab.

## 2019-11-02 NOTE — Telephone Encounter (Signed)
Patient would like to talk to someone about leg weakness she is having that has been going on since Monday. Patient would like to know if this could be related to Plaquenil ?Kari Henson Patient also would like to get lab results that she had on Tuesday, if possible. Please call to advise.

## 2019-11-02 NOTE — ED Notes (Signed)
Attempted to call report for second time, no answer

## 2019-11-02 NOTE — Telephone Encounter (Signed)
Pt notified and voiced understanding

## 2019-11-02 NOTE — ED Notes (Signed)
Date and time results received: 11/02/19 1555 (use smartphrase ".now" to insert current time)  Test: Calcium >15  Critical Value: 15  Name of Provider Notified: Dr. Manuella Ghazi   Orders Received? Or Actions Taken?: Admission

## 2019-11-02 NOTE — Telephone Encounter (Signed)
Send to ED

## 2019-11-02 NOTE — ED Notes (Signed)
Attempted to call report, called the unit with no answer

## 2019-11-02 NOTE — Telephone Encounter (Signed)
Received call from lab stating pt has a critical Calcium of greater than 15.

## 2019-11-03 ENCOUNTER — Telehealth: Payer: Medicare Other | Admitting: Cardiovascular Disease

## 2019-11-03 LAB — PARATHYROID HORMONE, INTACT (NO CA): PTH: 8 pg/mL — ABNORMAL LOW (ref 15–65)

## 2019-11-03 LAB — COMPREHENSIVE METABOLIC PANEL
ALT: 12 U/L (ref 0–44)
AST: 34 U/L (ref 15–41)
Albumin: 2.7 g/dL — ABNORMAL LOW (ref 3.5–5.0)
Alkaline Phosphatase: 79 U/L (ref 38–126)
Anion gap: 8 (ref 5–15)
BUN: 15 mg/dL (ref 6–20)
CO2: 27 mmol/L (ref 22–32)
Calcium: 14.5 mg/dL (ref 8.9–10.3)
Chloride: 100 mmol/L (ref 98–111)
Creatinine, Ser: 1.14 mg/dL — ABNORMAL HIGH (ref 0.44–1.00)
GFR calc Af Amer: >60 mL/min (ref >60)
GFR calc non Af Amer: 54 mL/min — ABNORMAL LOW (ref >60)
Glucose, Bld: 78 mg/dL (ref 70–99)
Potassium: 4 mmol/L (ref 3.5–5.1)
Sodium: 135 mmol/L (ref 135–145)
Total Bilirubin: 0.6 mg/dL (ref 0.3–1.2)
Total Protein: 5.8 g/dL — ABNORMAL LOW (ref 6.5–8.1)

## 2019-11-03 LAB — CBC
HCT: 30.2 % — ABNORMAL LOW (ref 36.0–46.0)
Hemoglobin: 9.6 g/dL — ABNORMAL LOW (ref 12.0–15.0)
MCH: 27.4 pg (ref 26.0–34.0)
MCHC: 31.8 g/dL (ref 30.0–36.0)
MCV: 86 fL (ref 80.0–100.0)
Platelets: 257 10*3/uL (ref 150–400)
RBC: 3.51 MIL/uL — ABNORMAL LOW (ref 3.87–5.11)
RDW: 15.9 % — ABNORMAL HIGH (ref 11.5–15.5)
WBC: 7 10*3/uL (ref 4.0–10.5)
nRBC: 0 % (ref 0.0–0.2)

## 2019-11-03 LAB — CALCIUM, IONIZED: Calcium, Ionized, Serum: 10.1 mg/dL — ABNORMAL HIGH (ref 4.5–5.6)

## 2019-11-03 LAB — PHOSPHORUS: Phosphorus: 1.9 mg/dL — ABNORMAL LOW (ref 2.5–4.6)

## 2019-11-03 LAB — CREATININE, URINE, RANDOM: Creatinine, Urine: 48.74 mg/dL

## 2019-11-03 LAB — HIV ANTIBODY (ROUTINE TESTING W REFLEX): HIV Screen 4th Generation wRfx: NONREACTIVE

## 2019-11-03 LAB — MAGNESIUM: Magnesium: 1.5 mg/dL — ABNORMAL LOW (ref 1.7–2.4)

## 2019-11-03 LAB — SODIUM, URINE, RANDOM: Sodium, Ur: 86 mmol/L

## 2019-11-03 MED ORDER — MAGNESIUM SULFATE 2 GM/50ML IV SOLN
2.0000 g | Freq: Once | INTRAVENOUS | Status: AC
  Administered 2019-11-03: 2 g via INTRAVENOUS
  Filled 2019-11-03: qty 50

## 2019-11-03 MED ORDER — CALCITONIN (SALMON) 200 UNIT/ML IJ SOLN
4.0000 [IU]/kg | Freq: Two times a day (BID) | INTRAMUSCULAR | Status: AC
  Administered 2019-11-03 – 2019-11-04 (×4): 238 [IU] via INTRAMUSCULAR
  Filled 2019-11-03 (×4): qty 1.19

## 2019-11-03 NOTE — Progress Notes (Signed)
Patient Demographics:    Kari Henson, is a 54 y.o. female, DOB - [birth year], BLT:[id number]  Admit date - 11/02/2019   Admitting Physician Pratik Darleen Crocker, DO  Outpatient Primary MD for the patient is [name], NP  LOS - 1   Chief Complaint  Patient presents with  . Extremity Weakness        Subjective:    Kari Henson today has no fevers, no emesis,  No chest pain, fatigue and leg cramps persist  Assessment  & Plan :    Active Problems:   Hypercalcemia  Brief Summary:- 54 y.o. female with medical history significant for SLE, ILD with mild pulmonary hypertension, GERD, MGUS, and vitamin D deficiency who has been having bilateral intermittent lower extremity weakness over the last 2 weeks admitted on 11/03/2018 with hypercalcemia    A/p 1)Hypercalcemia--- completed Zometa on 11/02/2019, start calcitonin, -Continue IV fluids hold off on Lasix -Corrected calcium still around 15 -PTH is not elevated, vitamin D is not elevated, -No evidence of sarcoidosis or other granulomatous disease, -Nephrology consult appreciated -At about 20 pound weight loss in the last month or so due to decreased oral intake  2)UIP and pulmonary hypertension --- continue Ofev, gust with patient's pulmonologist Dr. [name] -Recent echo with mild elevated PA pressures, recent RHC without significant abnormalities  3)AKI----acute kidney injury due to dehydration in setting of reduced oral intake -   creatinine on admission= 1.39 , baseline creatinine = 0.69 (10/26/19)   , creatinine is now= 1.14 ,  --renally adjust medications, avoid nephrotoxic agents / dehydration  / hypotension  4)SLE--continue Imuran and Plaquenil, patient sees rheumatologist Dr. [name]  5)MGUS--- patient sees rheumatologist outpatient, myeloma work-up in progress including SPEP and serum free light chains  6)H/o Vit D deficiency--- bone density was  normal in 2018  7)Hypomagnesemia/hypophosphatemia --- serum mag is 1.5, phosphorus 1.9,  replace and recheck  8)Acute on Chronic normocytic hypochromic Anemia--- Hgb is down to 9.6 after hydration (baseline 11 to 12)--  Disposition/Need for in-Hospital Stay- patient unable to be discharged at this time due to ---IVF and iv calcitonin- -Patient From: home D/C Place: home Barriers: Not Clinically Stable-severe electrolyte abnormalities Code Status : Full  Family Communication:   (patient is alert, awake and coherent) Discussed with her mother and boyfriend at bedside  Consults  : Nephrology/phone consult with pulmonologist Dr. [name]  DVT Prophylaxis  :  - Heparin - SCDs   Lab Results  Component Value Date   PLT 257 11/03/2019   Inpatient Medications  Scheduled Meds: . azaTHIOprine  50 mg Oral BID  . calcitonin  4 Units/kg Intramuscular BID  . ferrous sulfate  324 mg Oral Q breakfast  . heparin  5,000 Units Subcutaneous Q8H  . hydroxychloroquine  200 mg Oral BID  . loratadine  10 mg Oral Daily  . multivitamin with minerals  1 tablet Oral Daily  . Nintedanib  150 mg Oral BID  . pantoprazole  40 mg Oral Daily  . pneumococcal 23 valent vaccine  0.5 mL Intramuscular Tomorrow-1000   Continuous Infusions: . sodium chloride 150 mL/hr at 11/03/19 0808   PRN Meds:.acetaminophen **OR** acetaminophen, ondansetron **OR** ondansetron (ZOFRAN) IV    Anti-infectives (From admission, onward)   Start  Dose/Rate Route Frequency Ordered Stop   11/02/19 2200  hydroxychloroquine (PLAQUENIL) tablet 200 mg     200 mg Oral 2 times daily 11/02/19 1611          Objective:   Vitals:   11/03/19 1423 11/03/19 1500 11/03/19 1604 11/03/19 1830  BP: (!) 142/98  (!) 153/86 139/89  Pulse: (!) 132 (!) 125 (!) 128 (!) 126  Resp: _0 Temp: 100.3 F (37.9 C)     TempSrc: Oral     SpO2: 94%  (!) 85% 94%  Weight:      Height:        Wt Readings from Last 3 Encounters:  11/03/19  59.4 kg  10/24/19 58.7 kg  10/20/19 60.8 kg     Intake/Output Summary (Last 24 hours) at 11/03/2019 1923 Last data filed at 11/03/2019 0600 Gross per 24 hour  Intake 1844.12 ml  Output 500 ml  Net 1344.12 ml     Physical Exam  Gen:- Awake Alert,  In no apparent distress  HEENT:- [place].AT, No sclera icterus Neck-Supple Neck,No JVD,.  Lungs-  CTAB , fair symmetrical air movement CV- S1, S2 normal, regular  Abd-  +ve B.Sounds, Abd Soft, No tenderness,    Extremity/Skin:- No  edema, pedal pulses present  Psych-affect is appropriate, oriented x3 Neuro-generalized weakness, no new focal deficits, no tremors   Data Review:   Micro Results Recent Results (from the past 240 hour(s))  Respiratory Panel by RT PCR (Flu A&B, Covid) - Nasopharyngeal Swab     Status: None   Collection Time: 11/02/19  2:58 PM   Specimen: Nasopharyngeal Swab  Result Value Ref Range Status   SARS Coronavirus 2 by RT PCR NEGATIVE NEGATIVE Final    Comment: (NOTE) SARS-CoV-2 target nucleic acids are NOT DETECTED. The SARS-CoV-2 RNA is generally detectable in upper respiratoy specimens during the acute phase of infection. The lowest concentration of SARS-CoV-2 viral copies this assay can detect is 131 copies/mL. A negative result does not preclude SARS-Cov-2 infection and should not be used as the sole basis for treatment or other patient management decisions. A negative result may occur with  improper specimen collection/handling, submission of specimen other than nasopharyngeal swab, presence of viral mutation(s) within the areas targeted by this assay, and inadequate number of viral copies (<131 copies/mL). A negative result must be combined with clinical observations, patient history, and epidemiological information. The expected result is Negative. Fact Sheet for Patients:  PinkCheek.be Fact Sheet for Healthcare Providers:  GravelBags.it This  test is not yet ap proved or cleared by the [place] FDA and  has been authorized for detection and/or diagnosis of SARS-CoV-2 by FDA under an Emergency Use Authorization (EUA). This EUA will remain  in effect (meaning this test can be used) for the duration of the COVID-19 declaration under Section 564(b)(1) of the Act, 21 U.S.C. section 360bbb-3(b)(1), unless the authorization is terminated or revoked sooner.    Influenza A by PCR NEGATIVE NEGATIVE Final   Influenza B by PCR NEGATIVE NEGATIVE Final    Comment: (NOTE) The Xpert Xpress SARS-CoV-2/FLU/RSV assay is intended as an aid in  the diagnosis of influenza from Nasopharyngeal swab specimens and  should not be used as a sole basis for treatment. Nasal washings and  aspirates are unacceptable for Xpert Xpress SARS-CoV-2/FLU/RSV  testing. Fact Sheet for Patients: PinkCheek.be Fact Sheet for Healthcare Providers: GravelBags.it This test is not yet approved or cleared by the [place] FDA and  has  been authorized for detection and/or diagnosis of SARS-CoV-2 by  FDA under an Emergency Use Authorization (EUA). This EUA will remain  in effect (meaning this test can be used) for the duration of the  Covid-19 declaration under Section 564(b)(1) of the Act, 21  U.S.C. section 360bbb-3(b)(1), unless the authorization is  terminated or revoked. Performed at [facility], [address], [place], [place] [address]     Radiology Reports CARDIAC CATHETERIZATION  Result Date: 10/20/2019 Normal right heart pressures Normal cardiac output No evidence of pulmonary hypertension PA pressure 28/9 with mean of 19 mmHg RA pressure 3 with mean of 0 mmHg Pulmonary capillary wedge pressure A-wave 5, V wave 4, mean 3 mmHg Fick cardiac output 5.4 L/min  DG Chest Port 1 View  Result Date: 11/02/2019 CLINICAL DATA:  Cough EXAM: PORTABLE CHEST 1 VIEW COMPARISON:  04/14/2019, CT chest  06/21/2019 FINDINGS: Extensive bilateral pulmonary fibrosis, basilar predominant. New patchy bilateral somewhat nodular appearing foci of airspace disease throughout the lungs. Stable cardiomediastinal silhouette. No pneumothorax. IMPRESSION: Extensive pulmonary fibrosis. New patchy bilateral slightly nodular appearing foci of airspace disease suspect for acute superimposed bilateral pneumonia, possibly atypical etiology. Electronically Signed   By: Donavan Foil M.D.   On: 11/02/2019 15:12     CBC Recent Labs  Lab 11/02/19 1443 11/03/19 0450  WBC 8.3 7.0  HGB 12.5 9.6*  HCT 38.8 30.2*  PLT 305 257  MCV 85.3 86.0  MCH 27.5 27.4  MCHC 32.2 31.8  RDW 16.1* 15.9*    Chemistries  Recent Labs  Lab 11/02/19 0938 11/02/19 1443 11/03/19 0450  NA 131* 128* 135  K 3.7 3.7 4.0  CL 92* 89* 100  CO2 _0 GLUCOSE 114* 101* 78  BUN _1 CREATININE 1.39* 1.31* 1.14*  CALCIUM >15.0* >15.0* 14.5*  MG  --   --  1.5*  AST  --  45* 34  ALT  --  15 12  ALKPHOS  --  102 79  BILITOT  --  0.6 0.6   ------------------------------------------------------------------------------------------------------------------ No results for input(s): CHOL, HDL, LDLCALC, TRIG, CHOLHDL, LDLDIRECT in the last 72 hours.  No results found for: HGBA1C ------------------------------------------------------------------------------------------------------------------ No results for input(s): TSH, T4TOTAL, T3FREE, THYROIDAB in the last 72 hours.  Invalid input(s): FREET3 ------------------------------------------------------------------------------------------------------------------ No results for input(s): VITAMINB12, FOLATE, FERRITIN, TIBC, IRON, RETICCTPCT in the last 72 hours.  Coagulation profile No results for input(s): INR, PROTIME in the last 168 hours.  No results for input(s): DDIMER in the last 72 hours.  Cardiac Enzymes No results for input(s): CKMB, TROPONINI, MYOGLOBIN in the last 168  hours.  Invalid input(s): CK ------------------------------------------------------------------------------------------------------------------ No results found for: BNP   Roxan Hockey M.D on 11/03/2019 at 7:23 PM  Go to [email or web address] - for contact info  [facility] - Office  [phone]

## 2019-11-03 NOTE — Telephone Encounter (Signed)
Received AVISE test report Reviewed by [name], PA-C  Therapeutic Level of PLQ.   Attempted to contact the patient and recording came on stating call could not be completed at this time. Please try call again later.

## 2019-11-03 NOTE — Progress Notes (Signed)
CRITICAL VALUE ALERT  Critical Value:  Calcium 14.5  Date & Time Notied: 11/03/19 2706  Provider Notified: Jason Nest  Orders Received/Actions taken: No new orders at this time

## 2019-11-03 NOTE — Care Management Important Message (Signed)
Important Message  Patient Details  Name: Kari Henson MRN: [id number] Date of Birth: 09-13-1965   Medicare Important Message Given:  Yes     Tommy Medal 11/03/2019, 3:27 PM

## 2019-11-03 NOTE — Consult Note (Signed)
Kari Henson Admit Date: 11/02/2019 11/03/2019 Kari Henson Requesting Physician:  Denton Brick MD  Reason for Consult:  Hypercalcemia HPI:  38F admitted 5/6 after presenting to the emergency room when labs revealed hypercalcemia.  Past history includes SLE on azathioprine and Plaquenil, interstitial lung disease currently on Ofev, pulmonary hypertension, hypertension, historical diagnosis of MGUS.  Following is a trend of the patient's calcium:  Results for Kari, Henson (MRN [id number]) as of 11/03/2019 12:52  Ref. Range 04/28/2019 13:31 06/16/2019 14:30 08/03/2019 08:52 10/17/2019 10:21 10/26/2019 14:43 11/02/2019 09:38 11/02/2019 14:43 11/03/2019 04:50  Calcium Latest Ref Range: 8.9 - 10.3 mg/dL 9.8 9.9 10.3 (H) 11.0 (H) 12.7 (H) >15.0 (HH) >15.0 (HH) 14.5 (HH)   High-res chest CT from December 2020 did not identify lymphadenopathy, she carries no history of sarcoidosis.  Work-up here has revealed a PTH of less than 10.  She was on high-dose vitamin D and value since admission was 44.1.  PTH RP and 1, 25 vitamin D levels are pending.  She has been treated with aggressive hydration.  She received zoledronic acid on 5/6.  She has started on calcitonin here today.  She has no specific complaints at the current time.  No abdominal pain.  She has had some weakness since admission.  She denies use of antacids.  No bone health medications.  She has had approximately 23 pounds of weight loss, unintentional.  No fever, chills, night sweats.  Her serum protein electrophoresis was and November 2018 where there was a restricted M spike migrating in the beta-2 globulin region.  She has been following with oncology more remotely.  I/Os: I/O last 3 completed shifts: In: 2944.1 [P.O.:240; I.V.:1604.1; IV Piggyback:1100] Out: 500 [Urine:500]   ROS Balance of 12 systems is negative w/ exceptions as above  PMH  Past Medical History:  Diagnosis Date  . GERD (gastroesophageal reflux disease)   . Lung fibrosis  ([place])    follows with a Dr. Ouida Sills, pulmonary in [place], [place]  . 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 Seasonal allergies   . SLE (systemic lupus erythematosus) ([place]) 2010   she was on MTX (d/c due to cytopenia).  She has been on Prednisone and Plaquenil   PSH  Past Surgical History:  Procedure Laterality Date  . CESAREAN SECTION    . COLONOSCOPY N/A 04/27/2014   SLF:  1. one colon polyp removed 2 The left colonis redundant 3. the examined terminal ileum apperared to be  normal 3. small internal hemorrohoids.  . ESOPHAGOGASTRODUODENOSCOPY N/A 04/27/2014   SLF: 1. No obvious source for abdominal pain identified. 2. Non-erosive gastritis (inflammation) was found in the gastric antrum; multiple biopsies were performed.  Marland Kitchen HERNIA REPAIR    . MULTIPLE TOOTH EXTRACTIONS    . RIGHT HEART CATH N/A 10/20/2019   Procedure: RIGHT HEART CATH;  Surgeon: Sherren Mocha, MD;  Location: [facility];  Service: Cardiovascular;  Laterality: N/A;   FH  Family History  Problem Relation Age of Onset  . Asthma Mother   . Thyroid disease Mother   . Hypertension Mother   . Cirrhosis Father   . Alcohol abuse Father   . Diabetes Father   . COPD Father   . Diabetes Maternal Aunt   . Hypertension Sister   . Healthy Brother   . Diabetes Maternal Uncle   . Diabetes Paternal Aunt   . Hypertension Maternal Grandmother   . COPD Maternal Grandmother   . Arthritis Maternal Grandmother  rheumatoid arthritis  . Healthy Son   . Colon cancer Neg Hx    SH  reports that she quit smoking about 8 years ago. Her smoking use included cigarettes. She has a 56.00 pack-year smoking history. She has never used smokeless tobacco. She reports that she does not drink alcohol or use drugs. Allergies No Known Allergies Home medications Prior to Admission medications   Medication Sig Start Date End Date Taking? Authorizing Provider  azaTHIOprine (IMURAN) 50 MG tablet TAKE 2 TABLETS BY  MOUTH EVERY DAY Patient taking differently: Take 50 mg by mouth in the morning and at bedtime.  10/18/19  Yes Ofilia Neas, PA-C  ferrous sulfate 324 MG TBEC Take 324 mg by mouth daily with breakfast.   Yes [provider]  hydroxychloroquine (PLAQUENIL) 200 MG tablet Take 1 tablet (200 mg total) by mouth 2 (two) times daily. 09/18/19  Yes Deveshwar, Abel Presto, MD  loratadine (CLARITIN) 10 MG tablet Take 10 mg by mouth daily.   Yes [provider]  Multiple Vitamin (MULTIVITAMIN WITH MINERALS) TABS tablet Take 1 tablet by mouth daily.   Yes [provider]  Nintedanib (OFEV) 150 MG CAPS Take 150 mg by mouth 2 (two) times daily. q12 hrs   Yes [provider]  pantoprazole (PROTONIX) 40 MG tablet Take 40 mg by mouth daily.    Yes [provider]  potassium chloride SA (KLOR-CON) 20 MEQ tablet Take 1 tablet (20 mEq total) by mouth daily. 10/18/19  Yes Herminio Commons, MD  Vitamin D, Ergocalciferol, (DRISDOL) 50000 units CAPS capsule Take 50,000 Units by mouth every 7 (seven) days. Saturday or Sunday   Yes [provider]  acetaminophen (TYLENOL) 500 MG tablet Take 500-1,000 mg by mouth every 6 (six) hours as needed for mild pain or moderate pain.     [provider]    Current Medications Scheduled Meds: . azaTHIOprine  50 mg Oral BID  . calcitonin  4 Units/kg Intramuscular BID  . ferrous sulfate  324 mg Oral Q breakfast  . heparin  5,000 Units Subcutaneous Q8H  . hydroxychloroquine  200 mg Oral BID  . loratadine  10 mg Oral Daily  . multivitamin with minerals  1 tablet Oral Daily  . Nintedanib  150 mg Oral BID  . pantoprazole  40 mg Oral Daily  . pneumococcal 23 valent vaccine  0.5 mL Intramuscular Tomorrow-1000   Continuous Infusions: . sodium chloride 150 mL/hr at 11/03/19 0808   PRN Meds:.acetaminophen **OR** acetaminophen, ondansetron **OR** ondansetron (ZOFRAN) IV  CBC Recent Labs  Lab 11/02/19 1443 11/03/19 0450   WBC 8.3 7.0  HGB 12.5 9.6*  HCT 38.8 30.2*  MCV 85.3 86.0  PLT 305 494   Basic Metabolic Panel Recent Labs  Lab 11/02/19 0938 11/02/19 1443 11/03/19 0450  NA 131* 128* 135  K 3.7 3.7 4.0  CL 92* 89* 100  CO2 _0 GLUCOSE 114* 101* 78  BUN _1 CREATININE 1.39* 1.31* 1.14*  CALCIUM >15.0* >15.0* 14.5*    Physical Exam  Blood pressure 128/79, pulse (!) 108, temperature 100.1 F (37.8 C), temperature source Oral, resp. rate 18, height _2  (1.575 m), weight 59.4 kg, last menstrual period 07/01/2013, SpO2 97 %. GEN: Chronically ill-appearing ENT: NCAT EYES: EOMI CV: Regular, tachycardic, no rub, normal S1 and S2 PULM: Crackles throughout, especially in the bases ABD: Soft, nontender SKIN: No rashes or lesions EXT: No edema  Assessment 59F with severe hypercalcemia developing over  the past several months, suppressed PTH, history of SLE and interstitial lung disease on immunosuppression.  History of MGUS.  1. Severe hypercalcemia: Not PTH mediated; PTHrp and 1,25D pending.  No history of excess intake of calcium-based antacids.  No history of sarcoidosis. 2. AKI, likely related to hypovolemia, improving, normal GFR at baseline. 3. IPF followed by pulmonology on Ofev 4. SLE on Plaquenil and Imuran followed by Dr. [name] for with rheumatology  Plan 1. Agree with management to date including bisphosphonate, and initiation of calcitonin. 2. Given her normal GFR, I would anticipate these measures working within the next 24 hours and I do not think she needs dialysis 3. Etiology is unclear, we do know this is not a PTH mediated process, could be related to excess calcitriol or PTH RP; not sure how to connect this to her rheumatologic condition or interstitial lung disease. 4. Given history of MGUS, we will repeat SPEP and serum free light chains 5. We will follow along closely   Kari Henson  [phone] pgr 11/03/2019, 12:55 PM

## 2019-11-04 LAB — TSH: TSH: 2.558 u[IU]/mL (ref 0.350–4.500)

## 2019-11-04 LAB — RENAL FUNCTION PANEL
Albumin: 2.7 g/dL — ABNORMAL LOW (ref 3.5–5.0)
Anion gap: 10 (ref 5–15)
BUN: 12 mg/dL (ref 6–20)
CO2: 23 mmol/L (ref 22–32)
Calcium: 10.4 mg/dL — ABNORMAL HIGH (ref 8.9–10.3)
Chloride: 101 mmol/L (ref 98–111)
Creatinine, Ser: 0.92 mg/dL (ref 0.44–1.00)
GFR calc Af Amer: >60 mL/min (ref >60)
GFR calc non Af Amer: >60 mL/min (ref >60)
Glucose, Bld: 66 mg/dL — ABNORMAL LOW (ref 70–99)
Phosphorus: 1.9 mg/dL — ABNORMAL LOW (ref 2.5–4.6)
Potassium: 3.4 mmol/L — ABNORMAL LOW (ref 3.5–5.1)
Sodium: 134 mmol/L — ABNORMAL LOW (ref 135–145)

## 2019-11-04 MED ORDER — DEXTROSE-NACL 5-0.45 % IV SOLN: INTRAVENOUS | Status: DC

## 2019-11-04 MED ORDER — FERROUS SULFATE 325 (65 FE) MG PO TABS
325.0000 mg | ORAL_TABLET | Freq: Every day | ORAL | Status: DC
  Administered 2019-11-04 – 2019-11-06 (×3): 325 mg via ORAL
  Filled 2019-11-04 (×2): qty 1

## 2019-11-04 MED ORDER — POTASSIUM CHLORIDE CRYS ER 20 MEQ PO TBCR
40.0000 meq | EXTENDED_RELEASE_TABLET | Freq: Once | ORAL | Status: AC
  Administered 2019-11-04: 40 meq via ORAL
  Filled 2019-11-04: qty 2

## 2019-11-04 MED ORDER — K PHOS MONO-SOD PHOS DI & MONO 155-852-130 MG PO TABS
250.0000 mg | ORAL_TABLET | Freq: Three times a day (TID) | ORAL | Status: DC
  Administered 2019-11-04 – 2019-11-06 (×7): 250 mg via ORAL
  Filled 2019-11-04 (×7): qty 1

## 2019-11-04 NOTE — Progress Notes (Signed)
   11/04/19 1310  Assess: MEWS Score  Temp 98.7 F (37.1 C)  BP (!) 142/89  Pulse Rate (!) 105  Resp (!) 30  SpO2 98 %  O2 Device Nasal Cannula  O2 Flow Rate (L/min) 2 L/min  Assess: MEWS Score  MEWS Temp 0  MEWS Systolic 0  MEWS Pulse 1  MEWS RR 2  MEWS LOC 0  MEWS Score 3  MEWS Score Color Yellow  Assess: if the MEWS score is Yellow or Red  Were vital signs taken at a resting state? Yes  Focused Assessment Documented focused assessment  Early Detection of Sepsis Score *See Row Information* Low  MEWS guidelines implemented *See Row Information* No, vital signs rechecked  Treat  MEWS Interventions Other (Comment) (contacted Dr. Joesph Fillers)  Take Vital Signs  Increase Vital Sign Frequency  Yellow: Q 2hr X 2 then Q 4hr X 2, if remains yellow, continue Q 4hrs  Escalate  MEWS: Escalate Yellow: discuss with charge nurse/RN and consider discussing with provider and RRT  Notify: Charge Nurse/RN  Name of Charge Nurse/RN Notified Lattie Haw   Date Charge Nurse/RN Notified 11/04/19  Time Charge Nurse/RN Notified 25  Notify: Provider  Provider Name/Title Dr. Joesph Fillers  Date Provider Notified 11/04/19  Time Provider Notified 1310  Notification Type Face-to-face  Notification Reason Change in status  Response No new orders  Date of Provider Response 11/04/19  Time of Provider Response 1310  Document  Patient Outcome Other (Comment) (patient stable at this time)  Progress note created (see row info) Yes

## 2019-11-04 NOTE — Progress Notes (Signed)
   11/04/19 1810  Assess: MEWS Score  Temp 98.3 F (36.8 C)  BP (!) 147/93  Pulse Rate (!) 107  Resp 20  Level of Consciousness Alert  SpO2 98 %  O2 Device Nasal Cannula  O2 Flow Rate (L/min) 2 L/min  Assess: MEWS Score  MEWS Temp 0  MEWS Systolic 0  MEWS Pulse 1  MEWS RR 0  MEWS LOC 0  MEWS Score 1  MEWS Score Color Green  Assess: if the MEWS score is Yellow or Red  Were vital signs taken at a resting state? Yes  Focused Assessment Documented focused assessment  Early Detection of Sepsis Score *See Row Information* Low  MEWS guidelines implemented *See Row Information* No, previously yellow, continue vital signs every 4 hours

## 2019-11-04 NOTE — Progress Notes (Signed)
   11/04/19 1410  Assess: MEWS Score  Temp 98.7 F (37.1 C)  BP 140/88  Pulse Rate (!) 104  Resp (!) 25  Level of Consciousness Alert  SpO2 100 %  O2 Device Nasal Cannula  O2 Flow Rate (L/min) 2 L/min  Assess: MEWS Score  MEWS Temp 0  MEWS Systolic 0  MEWS Pulse 1  MEWS RR 1  MEWS LOC 0  MEWS Score 2  MEWS Score Color Yellow  Assess: if the MEWS score is Yellow or Red  Were vital signs taken at a resting state? Yes  Focused Assessment Documented focused assessment  Early Detection of Sepsis Score *See Row Information* Low  MEWS guidelines implemented *See Row Information* No, vital signs rechecked  Treat  MEWS Interventions Other (Comment) (Contacted Dr. Courage)  Take Vital Signs  Increase Vital Sign Frequency  Yellow: Q 2hr X 2 then Q 4hr X 2, if remains yellow, continue Q 4hrs  Escalate  MEWS: Escalate Yellow: discuss with charge nurse/RN and consider discussing with provider and RRT  Notify: Charge Nurse/RN  Name of Charge Nurse/RN Notified Arilynn Blakeney   Date Charge Nurse/RN Notified 11/04/19  Time Charge Nurse/RN Notified 1410  Notify: Provider  Provider Name/Title Dr. C. Emokpae  Date Provider Notified 11/04/19  Time Provider Notified 1420  Notification Type Page  Notification Reason Change in status  Response No new orders  Date of Provider Response 11/04/19  Document  Patient Outcome Other (Comment) (patient denies distress or discomfort will monitor)  Progress note created (see row info) Yes   

## 2019-11-04 NOTE — Progress Notes (Signed)
Patient Demographics:    Kari Henson, is a 54 y.o. female, DOB - Nov 21, 1965, FXJ:[id number]  Admit date - 11/02/2019   Admitting Physician Pratik Darleen Crocker, DO  Outpatient Primary MD for the patient is [name], NP  LOS - 2   Chief Complaint  Patient presents with  . Extremity Weakness        Subjective:    Kari Henson today has no fevers, no emesis,  -Resting comfortably, boyfriend at bedside,  Assessment  & Plan :    Active Problems:   Hypercalcemia  Brief Summary:- 54 y.o. female with medical history significant for SLE, ILD with mild pulmonary hypertension, GERD, MGUS, and vitamin D deficiency who has been having bilateral intermittent lower extremity weakness over the last 2 weeks admitted on 11/03/2018 with hypercalcemia    A/p 1)Hypercalcemia--- completed Zometa on 11/02/2019, started calcitonin on 11/03/19, -Continue IV fluids - hold off on Lasix -Corrected calcium down to just over 11 from over 15 -PTH is 8 which is low, vitamin D is not elevated, -TSH is 2.5 -No evidence of sarcoidosis or other granulomatous disease, -Nephrology consult appreciated -lost about 20 pound weight loss in the last month or so due to decreased oral intake  2)UIP and pulmonary hypertension --- continue Ofev, Discussed with  with patient's pulmonologist Dr. [name] -Recent echo with mild elevated PA pressures, recent RHC without significant abnormalities  3)AKI----acute kidney injury due to dehydration in setting of reduced oral intake -   creatinine on admission= 1.39 , baseline creatinine = 0.69 (10/26/19)   , creatinine is now= 0.9 ,  --renally adjust medications, avoid nephrotoxic agents / dehydration  / hypotension  4)SLE--continue Imuran and Plaquenil, patient sees rheumatologist Dr. [name]  5)MGUS--- patient sees rheumatologist outpatient, myeloma work-up in progress including SPEP and serum free  light chains  6)H/o Vit D deficiency--- bone density was normal in 2018 -PTH is low, vitamin D levels WNL  7)Hypomagnesemia/hypophosphatemia --- serum mag is 1.5, phosphorus 1.9,  replace and recheck  8)Acute on Chronic normocytic hypochromic Anemia--- Hgb is down to 9.6 after hydration (baseline 11 to 12)--  Disposition/Need for in-Hospital Stay- patient unable to be discharged at this time due to ---IVF and iv calcitonin- -Patient From: home D/C Place: home Barriers: Not Clinically Stable-severe electrolyte abnormalities Code Status : Full  Family Communication:   (patient is alert, awake and coherent) Discussed with her mother and boyfriend at bedside  Consults  : Nephrology/phone consult with pulmonologist Dr. [name]  DVT Prophylaxis  :  - Heparin - SCDs   Lab Results  Component Value Date   PLT 257 11/03/2019   Inpatient Medications  Scheduled Meds: . azaTHIOprine  50 mg Oral BID  . calcitonin  4 Units/kg Intramuscular BID  . ferrous sulfate  325 mg Oral Q breakfast  . heparin  5,000 Units Subcutaneous Q8H  . hydroxychloroquine  200 mg Oral BID  . loratadine  10 mg Oral Daily  . multivitamin with minerals  1 tablet Oral Daily  . Nintedanib  150 mg Oral BID  . pantoprazole  40 mg Oral Daily  . phosphorus  250 mg Oral TID  . pneumococcal 23 valent vaccine  0.5 mL Intramuscular Tomorrow-1000   Continuous Infusions: . dextrose 5 %  and 0.45% NaCl 75 mL/hr at 11/04/19 0928   PRN Meds:.acetaminophen **OR** acetaminophen, ondansetron **OR** ondansetron (ZOFRAN) IV    Anti-infectives (From admission, onward)   Start     Dose/Rate Route Frequency Ordered Stop   11/02/19 2200  hydroxychloroquine (PLAQUENIL) tablet 200 mg     200 mg Oral 2 times daily 11/02/19 1611          Objective:   Vitals:   11/04/19 0413 11/04/19 1310 11/04/19 1410 11/04/19 1810  BP: 126/79 (!) 142/89 140/88 (!) 147/93  Pulse: (!) 101 (!) 105 (!) 104 (!) 107  Resp: 16 (!) 30 (!) 25 20    Temp: 99.5 F (37.5 C) 98.7 F (37.1 C) 98.7 F (37.1 C) 98.3 F (36.8 C)  TempSrc: Oral  Oral   SpO2: 97% 98% 100% 98%  Weight:      Height:        Wt Readings from Last 3 Encounters:  11/04/19 58 kg  10/24/19 58.7 kg  10/20/19 60.8 kg     Intake/Output Summary (Last 24 hours) at 11/04/2019 1856 Last data filed at 11/04/2019 1500 Gross per 24 hour  Intake 3395.6 ml  Output --  Net 3395.6 ml     Physical Exam  Gen:- Awake Alert,  In no apparent distress  HEENT:- .AT, No sclera icterus Neck-Supple Neck,No JVD,.  Lungs-  CTAB , fair symmetrical air movement CV- S1, S2 normal, regular  Abd-  +ve B.Sounds, Abd Soft, No tenderness,    Extremity/Skin:- No  edema, pedal pulses present  Psych-affect is appropriate, oriented x3 Neuro-generalized weakness, no new focal deficits, no tremors   Data Review:   Micro Results Recent Results (from the past 240 hour(s))  Respiratory Panel by RT PCR (Flu A&B, Covid) - Nasopharyngeal Swab     Status: None   Collection Time: 11/02/19  2:58 PM   Specimen: Nasopharyngeal Swab  Result Value Ref Range Status   SARS Coronavirus 2 by RT PCR NEGATIVE NEGATIVE Final    Comment: (NOTE) SARS-CoV-2 target nucleic acids are NOT DETECTED. The SARS-CoV-2 RNA is generally detectable in upper respiratoy specimens during the acute phase of infection. The lowest concentration of SARS-CoV-2 viral copies this assay can detect is 131 copies/mL. A negative result does not preclude SARS-Cov-2 infection and should not be used as the sole basis for treatment or other patient management decisions. A negative result may occur with  improper specimen collection/handling, submission of specimen other than nasopharyngeal swab, presence of viral mutation(s) within the areas targeted by this assay, and inadequate number of viral copies (<131 copies/mL). A negative result must be combined with clinical observations, patient history, and epidemiological  information. The expected result is Negative. Fact Sheet for Patients:  PinkCheek.be Fact Sheet for Healthcare Providers:  GravelBags.it This test is not yet ap proved or cleared by the [place] FDA and  has been authorized for detection and/or diagnosis of SARS-CoV-2 by FDA under an Emergency Use Authorization (EUA). This EUA will remain  in effect (meaning this test can be used) for the duration of the COVID-19 declaration under Section 564(b)(1) of the Act, 21 U.S.C. section 360bbb-3(b)(1), unless the authorization is terminated or revoked sooner.    Influenza A by PCR NEGATIVE NEGATIVE Final   Influenza B by PCR NEGATIVE NEGATIVE Final    Comment: (NOTE) The Xpert Xpress SARS-CoV-2/FLU/RSV assay is intended as an aid in  the diagnosis of influenza from Nasopharyngeal swab specimens and  should not be used as a sole  basis for treatment. Nasal washings and  aspirates are unacceptable for Xpert Xpress SARS-CoV-2/FLU/RSV  testing. Fact Sheet for Patients: PinkCheek.be Fact Sheet for Healthcare Providers: GravelBags.it This test is not yet approved or cleared by the [place] FDA and  has been authorized for detection and/or diagnosis of SARS-CoV-2 by  FDA under an Emergency Use Authorization (EUA). This EUA will remain  in effect (meaning this test can be used) for the duration of the  Covid-19 declaration under Section 564(b)(1) of the Act, 21  U.S.C. section 360bbb-3(b)(1), unless the authorization is  terminated or revoked. Performed at [facility], [address], [place], [place] [address]     Radiology Reports CARDIAC CATHETERIZATION  Result Date: 10/20/2019 Normal right heart pressures Normal cardiac output No evidence of pulmonary hypertension PA pressure 28/9 with mean of 19 mmHg RA pressure 3 with mean of 0 mmHg Pulmonary capillary wedge pressure  A-wave 5, V wave 4, mean 3 mmHg Fick cardiac output 5.4 L/min  DG Chest Port 1 View  Result Date: 11/02/2019 CLINICAL DATA:  Cough EXAM: PORTABLE CHEST 1 VIEW COMPARISON:  04/14/2019, CT chest 06/21/2019 FINDINGS: Extensive bilateral pulmonary fibrosis, basilar predominant. New patchy bilateral somewhat nodular appearing foci of airspace disease throughout the lungs. Stable cardiomediastinal silhouette. No pneumothorax. IMPRESSION: Extensive pulmonary fibrosis. New patchy bilateral slightly nodular appearing foci of airspace disease suspect for acute superimposed bilateral pneumonia, possibly atypical etiology. Electronically Signed   By: Donavan Foil M.D.   On: 11/02/2019 15:12     CBC Recent Labs  Lab 11/02/19 1443 11/03/19 0450  WBC 8.3 7.0  HGB 12.5 9.6*  HCT 38.8 30.2*  PLT 305 257  MCV 85.3 86.0  MCH 27.5 27.4  MCHC 32.2 31.8  RDW 16.1* 15.9*    Chemistries  Recent Labs  Lab 11/02/19 0938 11/02/19 1443 11/03/19 0450 11/04/19 0559  NA 131* 128* 135 134*  K 3.7 3.7 4.0 3.4*  CL 92* 89* 100 101  CO2 _0 GLUCOSE 114* 101* 78 66*  BUN _1 CREATININE 1.39* 1.31* 1.14* 0.92  CALCIUM >15.0* >15.0* 14.5* 10.4*  MG  --   --  1.5*  --   AST  --  45* 34  --   ALT  --  15 12  --   ALKPHOS  --  102 79  --   BILITOT  --  0.6 0.6  --    ------------------------------------------------------------------------------------------------------------------ No results for input(s): CHOL, HDL, LDLCALC, TRIG, CHOLHDL, LDLDIRECT in the last 72 hours.  No results found for: HGBA1C ------------------------------------------------------------------------------------------------------------------ Recent Labs    11/04/19 0559  TSH 2.558   ------------------------------------------------------------------------------------------------------------------ No results for input(s): VITAMINB12, FOLATE, FERRITIN, TIBC, IRON, RETICCTPCT in the last 72 hours.  Coagulation  profile No results for input(s): INR, PROTIME in the last 168 hours.  No results for input(s): DDIMER in the last 72 hours.  Cardiac Enzymes No results for input(s): CKMB, TROPONINI, MYOGLOBIN in the last 168 hours.  Invalid input(s): CK ------------------------------------------------------------------------------------------------------------------ No results found for: BNP   Roxan Hockey M.D on 11/04/2019 at 6:56 PM  Go to [email or web address] - for contact info  [facility] - Office  [phone]

## 2019-11-04 NOTE — Progress Notes (Signed)
Patient ID: Kari Henson, female   DOB: 1965-11-15, 54 y.o.   MRN: [id number] S: Feels better today. O:BP 140/88   Pulse (!) 104   Temp 98.7 F (37.1 C)   Resp (!) 25   Ht _0  (1.575 m)   Wt 58 kg   LMP 07/01/2013   SpO2 100%   BMI 23.39 kg/m   Intake/Output Summary (Last 24 hours) at 11/04/2019 1500 Last data filed at 11/04/2019 0330 Gross per 24 hour  Intake 2981.8 ml  Output --  Net 2981.8 ml   Intake/Output: I/O last 3 completed shifts: In: 4825.9 [P.O.:480; I.V.:4345.9] Out: 500 [Urine:500]  Intake/Output this shift:  No intake/output data recorded. Weight change: 1.754 kg Gen: NAD CVS: tachy at 104, no rub Resp: scattered fine crackles Abd: +BS, soft, NT/ND Ext: no edema  Recent Labs  Lab 11/02/19 0938 11/02/19 1443 11/03/19 0450 11/03/19 1355 11/04/19 0559  NA 131* 128* 135  --  134*  K 3.7 3.7 4.0  --  3.4*  CL 92* 89* 100  --  101  CO2 _1 --  23  GLUCOSE 114* 101* 78  --  66*  BUN _2 --  12  CREATININE 1.39* 1.31* 1.14*  --  0.92  ALBUMIN  --  3.6 2.7*  --  2.7*  CALCIUM >15.0* >15.0* 14.5*  --  10.4*  PHOS  --   --   --  1.9* 1.9*  AST  --  45* 34  --   --   ALT  --  15 12  --   --    Liver Function Tests: Recent Labs  Lab 11/02/19 1443 11/03/19 0450 11/04/19 0559  AST 45* 34  --   ALT 15 12  --   ALKPHOS 102 79  --   BILITOT 0.6 0.6  --   PROT 7.9 5.8*  --   ALBUMIN 3.6 2.7* 2.7*   No results for input(s): LIPASE, AMYLASE in the last 168 hours. No results for input(s): AMMONIA in the last 168 hours. CBC: Recent Labs  Lab 11/02/19 1443 11/03/19 0450  WBC 8.3 7.0  HGB 12.5 9.6*  HCT 38.8 30.2*  MCV 85.3 86.0  PLT 305 257   Cardiac Enzymes: No results for input(s): CKTOTAL, CKMB, CKMBINDEX, TROPONINI in the last 168 hours. CBG: No results for input(s): GLUCAP in the last 168 hours.  Iron Studies: No results for input(s): IRON, TIBC, TRANSFERRIN, FERRITIN in the last 72 hours. Studies/Results: DG Chest Port 1  View  Result Date: 11/02/2019 CLINICAL DATA:  Cough EXAM: PORTABLE CHEST 1 VIEW COMPARISON:  04/14/2019, CT chest 06/21/2019 FINDINGS: Extensive bilateral pulmonary fibrosis, basilar predominant. New patchy bilateral somewhat nodular appearing foci of airspace disease throughout the lungs. Stable cardiomediastinal silhouette. No pneumothorax. IMPRESSION: Extensive pulmonary fibrosis. New patchy bilateral slightly nodular appearing foci of airspace disease suspect for acute superimposed bilateral pneumonia, possibly atypical etiology. Electronically Signed   By: Donavan Foil M.D.   On: 11/02/2019 15:12   . azaTHIOprine  50 mg Oral BID  . calcitonin  4 Units/kg Intramuscular BID  . ferrous sulfate  325 mg Oral Q breakfast  . heparin  5,000 Units Subcutaneous Q8H  . hydroxychloroquine  200 mg Oral BID  . loratadine  10 mg Oral Daily  . multivitamin with minerals  1 tablet Oral Daily  . Nintedanib  150 mg Oral BID  . pantoprazole  40 mg Oral Daily  . phosphorus  250  mg Oral TID  . pneumococcal 23 valent vaccine  0.5 mL Intramuscular Tomorrow-1000    BMET    Component Value Date/Time   NA 134 (L) 11/04/2019 0559   NA 139 08/03/2019 0852   K 3.4 (L) 11/04/2019 0559   CL 101 11/04/2019 0559   CO2 23 11/04/2019 0559   GLUCOSE 66 (L) 11/04/2019 0559   BUN 12 11/04/2019 0559   BUN 6 08/03/2019 0852   CREATININE 0.92 11/04/2019 0559   CREATININE 0.54 04/28/2019 1331   CALCIUM 10.4 (H) 11/04/2019 0559   GFRNONAA >60 11/04/2019 0559   GFRNONAA 107 04/28/2019 1331   GFRAA >60 11/04/2019 0559   GFRAA 124 04/28/2019 1331   CBC    Component Value Date/Time   WBC 7.0 11/03/2019 0450   RBC 3.51 (L) 11/03/2019 0450   HGB 9.6 (L) 11/03/2019 0450   HGB 12.8 08/03/2019 0852   HCT 30.2 (L) 11/03/2019 0450   HCT 40.8 08/03/2019 0852   PLT 257 11/03/2019 0450   PLT 280 08/03/2019 0852   MCV 86.0 11/03/2019 0450   MCV 85 08/03/2019 0852   MCH 27.4 11/03/2019 0450   MCHC 31.8 11/03/2019 0450    RDW 15.9 (H) 11/03/2019 0450   RDW 13.3 08/03/2019 0852   LYMPHSABS 1.0 10/17/2019 1021   LYMPHSABS 0.7 08/03/2019 0852   MONOABS 1.3 (H) 10/17/2019 1021   EOSABS 0.3 10/17/2019 1021   EOSABS 0.6 (H) 08/03/2019 0852   BASOSABS 0.1 10/17/2019 1021   BASOSABS 0.1 08/03/2019 0852     Assessment/Plan:  1. Hypercalcemia- was severe but responding to IVF's and bisphosphate and calcitonin.  Unclear etiology.  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 1. Low iPTH so non-parathyroid mediated 2. PTH-related peptide pending 3. Needs complete occult malignancy workup (per her report had a negative breast mammogram 2 months ago) 4. Sarcoid unlikely given recent high resolution CT scan without supportive findings. 5. SPEP/UPEP pending 6. Vitamin D levels ok.  7. Will check vitamin A and TSH 2. AKI- presumably due to hypovolemia and has resolved with IVF's 3. 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 SLE- on plaquenil and imuran per Dr. [name] of Rheumatology.  Donetta Potts, MD [employer] [phone]

## 2019-11-04 NOTE — Progress Notes (Signed)
   11/04/19 1410  Assess: MEWS Score  Temp 98.7 F (37.1 C)  BP 140/88  Pulse Rate (!) 104  Resp (!) 25  Level of Consciousness Alert  SpO2 100 %  O2 Device Nasal Cannula  O2 Flow Rate (L/min) 2 L/min  Assess: MEWS Score  MEWS Temp 0  MEWS Systolic 0  MEWS Pulse 1  MEWS RR 1  MEWS LOC 0  MEWS Score 2  MEWS Score Color Yellow  Assess: if the MEWS score is Yellow or Red  Were vital signs taken at a resting state? Yes  Focused Assessment Documented focused assessment  Early Detection of Sepsis Score *See Row Information* Low  MEWS guidelines implemented *See Row Information* No, vital signs rechecked  Treat  MEWS Interventions Other (Comment) (Contacted Dr. Joesph Fillers)  Take Vital Signs  Increase Vital Sign Frequency  Yellow: Q 2hr X 2 then Q 4hr X 2, if remains yellow, continue Q 4hrs  Escalate  MEWS: Escalate Yellow: discuss with charge nurse/RN and consider discussing with provider and RRT  Notify: Charge Nurse/RN  Name of Charge Nurse/RN Notified Lattie Haw   Date Charge Nurse/RN Notified 11/04/19  Time Charge Nurse/RN Notified 1410  Notify: Provider  Provider Name/Title Dr. Maurene Capes  Date Provider Notified 11/04/19  Time Provider Notified 1420  Notification Type Page  Notification Reason Change in status  Response No new orders  Date of Provider Response 11/04/19  Document  Patient Outcome Other (Comment) (patient denies distress or discomfort will monitor)  Progress note created (see row info) Yes

## 2019-11-05 ENCOUNTER — Inpatient Hospital Stay (HOSPITAL_COMMUNITY): Payer: Medicare Other

## 2019-11-05 LAB — RENAL FUNCTION PANEL
Albumin: 2.8 g/dL — ABNORMAL LOW (ref 3.5–5.0)
Anion gap: 11 (ref 5–15)
BUN: 9 mg/dL (ref 6–20)
CO2: 23 mmol/L (ref 22–32)
Calcium: 9.6 mg/dL (ref 8.9–10.3)
Chloride: 99 mmol/L (ref 98–111)
Creatinine, Ser: 0.86 mg/dL (ref 0.44–1.00)
GFR calc Af Amer: >60 mL/min (ref >60)
GFR calc non Af Amer: >60 mL/min (ref >60)
Glucose, Bld: 90 mg/dL (ref 70–99)
Phosphorus: 1.7 mg/dL — ABNORMAL LOW (ref 2.5–4.6)
Potassium: 3 mmol/L — ABNORMAL LOW (ref 3.5–5.1)
Sodium: 133 mmol/L — ABNORMAL LOW (ref 135–145)

## 2019-11-05 LAB — MAGNESIUM: Magnesium: 1.2 mg/dL — ABNORMAL LOW (ref 1.7–2.4)

## 2019-11-05 MED ORDER — POTASSIUM CHLORIDE CRYS ER 20 MEQ PO TBCR
40.0000 meq | EXTENDED_RELEASE_TABLET | ORAL | Status: AC
  Administered 2019-11-05 (×2): 40 meq via ORAL
  Filled 2019-11-05 (×2): qty 2

## 2019-11-05 MED ORDER — MAGNESIUM SULFATE 4 GM/100ML IV SOLN
4.0000 g | Freq: Once | INTRAVENOUS | Status: AC
  Administered 2019-11-05: 4 g via INTRAVENOUS
  Filled 2019-11-05: qty 100

## 2019-11-05 MED ORDER — POTASSIUM PHOSPHATES 15 MMOLE/5ML IV SOLN
20.0000 mmol | Freq: Once | INTRAVENOUS | Status: AC
  Administered 2019-11-05: 20 mmol via INTRAVENOUS
  Filled 2019-11-05: qty 6.67

## 2019-11-05 NOTE — Progress Notes (Signed)
SATURATION QUALIFICATIONS: (This note is used to comply with regulatory documentation for home oxygen)  Patient Saturations on Room Air at Rest = 94%  Patient Saturations on Room Air while Ambulating = 85%  Patient Saturations on 2 Liters of oxygen while Ambulating = 91%  Please briefly explain why patient needs home oxygen:

## 2019-11-05 NOTE — Progress Notes (Signed)
Patient Demographics:    Kari Henson, is a 54 y.o. female, DOB - 1965-12-13, GYJ:[id number]  Admit date - 11/02/2019   Admitting Physician Pratik Darleen Crocker, DO  Outpatient Primary MD for the patient is [name], NP  LOS - 3   Chief Complaint  Patient presents with  . Extremity Weakness        Subjective:    Kari Henson today has no fevers, no emesis,  -Resting comfortably, boyfriend at bedside, -Dyspnea on exertion persist,  desatted to 85% on room air  Assessment  & Plan :    Principal Problem:   Hypercalcemia Active Problems:   SLE (systemic lupus erythematosus) (HCC)   ILD (interstitial lung disease) /UIP   Vitamin D deficiency   Other secondary pulmonary hypertension ([place])  Brief Summary:- 54 y.o. female with medical history significant for SLE, ILD with mild pulmonary hypertension, GERD, MGUS, and vitamin D deficiency who has been having bilateral intermittent lower extremity weakness over the last 2 weeks admitted on 11/03/2018 with hypercalcemia -Hypoxia persist    A/p 1)Hypercalcemia--- completed Zometa on 11/02/2019, started calcitonin on 11/03/19, -Okay to stop IV fluids - hold off on Lasix -Corrected calcium down less than 11 from over 15 -PTH is 8 which is low, vitamin D is not elevated, -TSH is 2.5 -No evidence of sarcoidosis or other granulomatous disease, -Nephrology consult appreciated -lost about 20 pound weight loss in the last month or so due to decreased oral intake  2)UIP and pulmonary hypertension --- continue Ofev, Discussed with  with patient's pulmonologist Dr. [name] -Recent echo with mild elevated PA pressures, recent RHC without significant abnormalities -Persistent dyspnea on exertion and hypoxia noted requiring continuous oxygen  3)AKI----acute kidney injury due to dehydration in setting of reduced oral intake -   creatinine on admission= 1.39 , baseline  creatinine = 0.69 (10/26/19)   , creatinine is now= 0.86,  --renally adjust medications, avoid nephrotoxic agents / dehydration  / hypotension  4)SLE--continue Imuran and Plaquenil, patient sees rheumatologist Dr. [name]  5)MGUS--- patient sees rheumatologist outpatient, myeloma work-up in progress including SPEP and serum free light chains  6)H/o Vit D deficiency--- bone density was normal in 2018 -PTH is low, vitamin D levels WNL  7)Hypomagnesemia/hypophosphatemia/hypokalemia --- serum mag is 1.2, phosphorus 1.7, potassium is 3.0----- replace mag, phosphorus and potassium IV and orally and recheck  8)Acute on Chronic normocytic hypochromic Anemia--- Hgb is down to 9.6 after hydration (baseline 11 to 12)--  Disposition/Need for in-Hospital Stay- patient unable to be discharged at this time due to ---IVF and iv calcitonin- -Patient From: home D/C Place: home Barriers: Not Clinically Stable-severe electrolyte abnormalities--- requiring IV replacements to avoid life-threatening arrhythmias  Code Status : Full  Family Communication:   (patient is alert, awake and coherent) Discussed with her mother and boyfriend at bedside  Consults  : Nephrology   --phone consult with pulmonologist Dr. [name]  DVT Prophylaxis  :  - Heparin - SCDs   Lab Results  Component Value Date   PLT 257 11/03/2019   Inpatient Medications  Scheduled Meds: . azaTHIOprine  50 mg Oral BID  . ferrous sulfate  325 mg Oral Q breakfast  . heparin  5,000 Units Subcutaneous Q8H  . hydroxychloroquine  200 mg  Oral BID  . loratadine  10 mg Oral Daily  . multivitamin with minerals  1 tablet Oral Daily  . Nintedanib  150 mg Oral BID  . pantoprazole  40 mg Oral Daily  . phosphorus  250 mg Oral TID  . pneumococcal 23 valent vaccine  0.5 mL Intramuscular Tomorrow-1000   Continuous Infusions: . potassium PHOSPHATE IVPB (in mmol) 20 mmol (11/05/19 1509)   PRN Meds:.acetaminophen **OR** acetaminophen, ondansetron  **OR** ondansetron (ZOFRAN) IV    Anti-infectives (From admission, onward)   Start     Dose/Rate Route Frequency Ordered Stop   11/02/19 2200  hydroxychloroquine (PLAQUENIL) tablet 200 mg     200 mg Oral 2 times daily 11/02/19 1611          Objective:   Vitals:   11/04/19 2145 11/05/19 0447 11/05/19 1438 11/05/19 1500  BP: 133/82 129/81 131/80   Pulse: (!) 105 (!) 108 (!) 103 96  Resp: 18 16 (!) 32 (!) 21  Temp: 99 F (37.2 C) 99 F (37.2 C) 98.4 F (36.9 C)   TempSrc: Oral Oral    SpO2:  95% 98%   Weight:      Height:        Wt Readings from Last 3 Encounters:  11/04/19 58 kg  10/24/19 58.7 kg  10/20/19 60.8 kg    No intake or output data in the 24 hours ending 11/05/19 1640   Physical Exam  Gen:- Awake Alert,  In no apparent distress  HEENT:- [place].AT, No sclera icterus Nose- [place] 2L/min Neck-Supple Neck,No JVD,.  Lungs-Velcro type rales consistent with fibrosis , diminished breath sounds overall CV- S1, S2 normal, regular  Abd-  +ve B.Sounds, Abd Soft, No tenderness,    Extremity/Skin:- No  edema, pedal pulses present  Psych-affect is appropriate, oriented x3 Neuro-generalized weakness, no new focal deficits, no tremors   Data Review:   Micro Results Recent Results (from the past 240 hour(s))  Respiratory Panel by RT PCR (Flu A&B, Covid) - Nasopharyngeal Swab     Status: None   Collection Time: 11/02/19  2:58 PM   Specimen: Nasopharyngeal Swab  Result Value Ref Range Status   SARS Coronavirus 2 by RT PCR NEGATIVE NEGATIVE Final    Comment: (NOTE) SARS-CoV-2 target nucleic acids are NOT DETECTED. The SARS-CoV-2 RNA is generally detectable in upper respiratoy specimens during the acute phase of infection. The lowest concentration of SARS-CoV-2 viral copies this assay can detect is 131 copies/mL. A negative result does not preclude SARS-Cov-2 infection and should not be used as the sole basis for treatment or other patient management decisions. A negative  result may occur with  improper specimen collection/handling, submission of specimen other than nasopharyngeal swab, presence of viral mutation(s) within the areas targeted by this assay, and inadequate number of viral copies (<131 copies/mL). A negative result must be combined with clinical observations, patient history, and epidemiological information. The expected result is Negative. Fact Sheet for Patients:  PinkCheek.be Fact Sheet for Healthcare Providers:  GravelBags.it This test is not yet ap proved or cleared by the [place] FDA and  has been authorized for detection and/or diagnosis of SARS-CoV-2 by FDA under an Emergency Use Authorization (EUA). This EUA will remain  in effect (meaning this test can be used) for the duration of the COVID-19 declaration under Section 564(b)(1) of the Act, 21 U.S.C. section 360bbb-3(b)(1), unless the authorization is terminated or revoked sooner.    Influenza A by PCR NEGATIVE NEGATIVE Final   Influenza  B by PCR NEGATIVE NEGATIVE Final    Comment: (NOTE) The Xpert Xpress SARS-CoV-2/FLU/RSV assay is intended as an aid in  the diagnosis of influenza from Nasopharyngeal swab specimens and  should not be used as a sole basis for treatment. Nasal washings and  aspirates are unacceptable for Xpert Xpress SARS-CoV-2/FLU/RSV  testing. Fact Sheet for Patients: PinkCheek.be Fact Sheet for Healthcare Providers: GravelBags.it This test is not yet approved or cleared by the [place] FDA and  has been authorized for detection and/or diagnosis of SARS-CoV-2 by  FDA under an Emergency Use Authorization (EUA). This EUA will remain  in effect (meaning this test can be used) for the duration of the  Covid-19 declaration under Section 564(b)(1) of the Act, 21  U.S.C. section 360bbb-3(b)(1), unless the authorization is  terminated or  revoked. Performed at [facility], [address], [place], [place] [address]     Radiology Reports DG Chest 2 View  Result Date: 11/05/2019 CLINICAL DATA:  Generalized weakness EXAM: CHEST - 2 VIEW COMPARISON:  Nov 02, 2019 chest radiograph and chest CT June 21, 2019 FINDINGS: There is widespread fibrosis throughout the lungs, essentially stable. A degree of patchy airspace opacity in the lower lung regions superimposed is difficult to exclude in this circumstance. Heart size and pulmonary vascularity are within normal limits. No adenopathy. No bone lesions. IMPRESSION: Extensive fibrosis. A degree of patchy superimposed airspace disease in the bases is difficult to exclude entirely in this circumstance. Appearance is essentially stable compared to 3 days prior. Cardiac silhouette within normal limits. No demonstrable adenopathy. Electronically Signed   By: Lowella Grip III M.D.   On: 11/05/2019 11:42   CARDIAC CATHETERIZATION  Result Date: 10/20/2019 Normal right heart pressures Normal cardiac output No evidence of pulmonary hypertension PA pressure 28/9 with mean of 19 mmHg RA pressure 3 with mean of 0 mmHg Pulmonary capillary wedge pressure A-wave 5, V wave 4, mean 3 mmHg Fick cardiac output 5.4 L/min  DG Chest Port 1 View  Result Date: 11/02/2019 CLINICAL DATA:  Cough EXAM: PORTABLE CHEST 1 VIEW COMPARISON:  04/14/2019, CT chest 06/21/2019 FINDINGS: Extensive bilateral pulmonary fibrosis, basilar predominant. New patchy bilateral somewhat nodular appearing foci of airspace disease throughout the lungs. Stable cardiomediastinal silhouette. No pneumothorax. IMPRESSION: Extensive pulmonary fibrosis. New patchy bilateral slightly nodular appearing foci of airspace disease suspect for acute superimposed bilateral pneumonia, possibly atypical etiology. Electronically Signed   By: Donavan Foil M.D.   On: 11/02/2019 15:12     CBC Recent Labs  Lab 11/02/19 1443 11/03/19 0450  WBC 8.3 7.0    HGB 12.5 9.6*  HCT 38.8 30.2*  PLT 305 257  MCV 85.3 86.0  MCH 27.5 27.4  MCHC 32.2 31.8  RDW 16.1* 15.9*    Chemistries  Recent Labs  Lab 11/02/19 0938 11/02/19 1443 11/03/19 0450 11/04/19 0559 11/05/19 0633  NA 131* 128* 135 134* 133*  K 3.7 3.7 4.0 3.4* 3.0*  CL 92* 89* 100 101 99  CO2 _0 GLUCOSE 114* 101* 78 66* 90  BUN _1 CREATININE 1.39* 1.31* 1.14* 0.92 0.86  CALCIUM >15.0* >15.0* 14.5* 10.4* 9.6  MG  --   --  1.5*  --  1.2*  AST  --  45* 34  --   --   ALT  --  15 12  --   --   ALKPHOS  --  102 79  --   --  BILITOT  --  0.6 0.6  --   --    ------------------------------------------------------------------------------------------------------------------ No results for input(s): CHOL, HDL, LDLCALC, TRIG, CHOLHDL, LDLDIRECT in the last 72 hours.  No results found for: HGBA1C ------------------------------------------------------------------------------------------------------------------ Recent Labs    11/04/19 0559  TSH 2.558   ------------------------------------------------------------------------------------------------------------------ No results for input(s): VITAMINB12, FOLATE, FERRITIN, TIBC, IRON, RETICCTPCT in the last 72 hours.  Coagulation profile No results for input(s): INR, PROTIME in the last 168 hours.  No results for input(s): DDIMER in the last 72 hours.  Cardiac Enzymes No results for input(s): CKMB, TROPONINI, MYOGLOBIN in the last 168 hours.  Invalid input(s): CK ------------------------------------------------------------------------------------------------------------------ No results found for: BNP   Roxan Hockey M.D on 11/05/2019 at 4:40 PM  Go to [email or web address] - for contact info  [facility] - Office  [phone]

## 2019-11-06 LAB — RENAL FUNCTION PANEL
Albumin: 3 g/dL — ABNORMAL LOW (ref 3.5–5.0)
Anion gap: 12 (ref 5–15)
BUN: 8 mg/dL (ref 6–20)
CO2: 21 mmol/L — ABNORMAL LOW (ref 22–32)
Calcium: 9.2 mg/dL (ref 8.9–10.3)
Chloride: 99 mmol/L (ref 98–111)
Creatinine, Ser: 0.79 mg/dL (ref 0.44–1.00)
GFR calc Af Amer: >60 mL/min (ref >60)
GFR calc non Af Amer: >60 mL/min (ref >60)
Glucose, Bld: 92 mg/dL (ref 70–99)
Phosphorus: 1.7 mg/dL — ABNORMAL LOW (ref 2.5–4.6)
Potassium: 3.7 mmol/L (ref 3.5–5.1)
Sodium: 132 mmol/L — ABNORMAL LOW (ref 135–145)

## 2019-11-06 LAB — PROTEIN ELECTROPHORESIS, SERUM
A/G Ratio: 0.8 (ref 0.7–1.7)
Albumin ELP: 2.9 g/dL (ref 2.9–4.4)
Alpha-1-Globulin: 0.3 g/dL (ref 0.0–0.4)
Alpha-2-Globulin: 1.1 g/dL — ABNORMAL HIGH (ref 0.4–1.0)
Beta Globulin: 0.9 g/dL (ref 0.7–1.3)
Gamma Globulin: 1.4 g/dL (ref 0.4–1.8)
Globulin, Total: 3.7 g/dL (ref 2.2–3.9)
M-Spike, %: 0.4 g/dL — ABNORMAL HIGH
Total Protein ELP: 6.6 g/dL (ref 6.0–8.5)

## 2019-11-06 LAB — KAPPA/LAMBDA LIGHT CHAINS
Kappa free light chain: 33.8 mg/L — ABNORMAL HIGH (ref 3.3–19.4)
Kappa, lambda light chain ratio: 1.49 (ref 0.26–1.65)
Lambda free light chains: 22.7 mg/L (ref 5.7–26.3)

## 2019-11-06 LAB — CALCITRIOL (1,25 DI-OH VIT D): Vit D, 1,25-Dihydroxy: 225.3 pg/mL — ABNORMAL HIGH (ref 19.9–79.3)

## 2019-11-06 LAB — MAGNESIUM: Magnesium: 1.6 mg/dL — ABNORMAL LOW (ref 1.7–2.4)

## 2019-11-06 MED ORDER — HYDROCODONE-ACETAMINOPHEN 5-325 MG PO TABS: 1.0000 | ORAL_TABLET | Freq: Four times a day (QID) | ORAL | 0 refills | Status: AC | PRN

## 2019-11-06 MED ORDER — POTASSIUM PHOSPHATES 15 MMOLE/5ML IV SOLN
30.0000 mmol | Freq: Once | INTRAVENOUS | Status: AC
  Administered 2019-11-06: 30 mmol via INTRAVENOUS
  Filled 2019-11-06: qty 10

## 2019-11-06 MED ORDER — HYDROXYCHLOROQUINE SULFATE 200 MG PO TABS: 200.0000 mg | ORAL_TABLET | Freq: Two times a day (BID) | ORAL | 0 refills | Status: AC

## 2019-11-06 MED ORDER — MAGNESIUM SULFATE 4 GM/100ML IV SOLN
4.0000 g | Freq: Once | INTRAVENOUS | Status: AC
  Administered 2019-11-06: 4 g via INTRAVENOUS
  Filled 2019-11-06: qty 100

## 2019-11-06 MED ORDER — PANTOPRAZOLE SODIUM 40 MG PO TBEC: 40.0000 mg | DELAYED_RELEASE_TABLET | Freq: Every day | ORAL | 3 refills | Status: AC

## 2019-11-06 MED ORDER — POTASSIUM CHLORIDE CRYS ER 20 MEQ PO TBCR: 20.0000 meq | EXTENDED_RELEASE_TABLET | Freq: Every day | ORAL | 3 refills | Status: AC

## 2019-11-06 MED ORDER — K PHOS MONO-SOD PHOS DI & MONO 155-852-130 MG PO TABS: 250.0000 mg | ORAL_TABLET | Freq: Three times a day (TID) | ORAL | 0 refills | Status: DC

## 2019-11-06 MED ORDER — AZATHIOPRINE 50 MG PO TABS: 50.0000 mg | ORAL_TABLET | Freq: Two times a day (BID) | ORAL | 2 refills | Status: AC

## 2019-11-06 MED ORDER — FERROUS SULFATE 324 MG PO TBEC: 324.0000 mg | DELAYED_RELEASE_TABLET | Freq: Every day | ORAL | 3 refills | Status: AC

## 2019-11-06 NOTE — Progress Notes (Signed)
Nsg Discharge Note  Admit Date:  11/02/2019 Discharge date: 11/06/2019   Kari Henson to be D/C'd Home per MD order.  AVS completed.  Copy for chart, and copy for patient signed, and dated. Patient/caregiver able to verbalize understanding.  Discharge Medication: Allergies as of 11/06/2019   No Known Allergies     Medication List    STOP taking these medications   Vitamin D (Ergocalciferol) 1.25 MG (50000 UNIT) Caps capsule Commonly known as: DRISDOL     TAKE these medications   acetaminophen 500 MG tablet Commonly known as: TYLENOL Take 500-1,000 mg by mouth every 6 (six) hours as needed for mild pain or moderate pain.   azaTHIOprine 50 MG tablet Commonly known as: IMURAN Take 1 tablet (50 mg total) by mouth in the morning and at bedtime.   ferrous sulfate 324 MG Tbec Take 1 tablet (324 mg total) by mouth daily with breakfast.   HYDROcodone-acetaminophen 5-325 MG tablet Commonly known as: NORCO/VICODIN Take 1 tablet by mouth every 6 (six) hours as needed for moderate pain.   hydroxychloroquine 200 MG tablet Commonly known as: PLAQUENIL Take 1 tablet (200 mg total) by mouth 2 (two) times daily.   loratadine 10 MG tablet Commonly known as: CLARITIN Take 10 mg by mouth daily.   multivitamin with minerals Tabs tablet Take 1 tablet by mouth daily.   Ofev 150 MG Caps Generic drug: Nintedanib Take 150 mg by mouth 2 (two) times daily. q12 hrs   pantoprazole 40 MG tablet Commonly known as: PROTONIX Take 1 tablet (40 mg total) by mouth daily.   phosphorus 155-852-130 MG tablet Commonly known as: K PHOS NEUTRAL Take 1 tablet (250 mg total) by mouth 3 (three) times daily.   potassium chloride SA 20 MEQ tablet Commonly known as: KLOR-CON Take 1 tablet (20 mEq total) by mouth daily.            Durable Medical Equipment  (From admission, onward)         Start     Ordered   11/05/19 1646  For home use only DME oxygen  Once    Comments: SATURATION  QUALIFICATIONS: (Thisnote is usedto comply with regulatory documentation for home oxygen)  Patient Saturations on Room Air at Rest =94 %  Patient Saturations on Room Air while Ambulating =85%  Patient Saturations on2Liters of oxygen while Ambulating = 91 %    Patient needs continuous O2 at 2 L/min continuously via nasal cannula with humidifier, with gaseous portability and conserving device  Question Answer Comment  Length of Need Lifetime   Mode or (Route) Nasal cannula   Liters per Minute 2   Frequency Continuous (stationary and portable oxygen unit needed)   Oxygen conserving device Yes   Oxygen delivery system Gas      11/05/19 1647          Discharge Assessment: Vitals:   11/06/19 1114 11/06/19 1430  BP:  121/79  Pulse: (!) 108 99  Resp:  16  Temp:  98.4 F (36.9 C)  SpO2:  98%   Skin clean, dry and intact without evidence of skin break down, no evidence of skin tears noted. IV catheter discontinued intact. Site without signs and symptoms of complications - no redness or edema noted at insertion site, patient denies c/o pain - only slight tenderness at site.  Dressing with slight pressure applied.  D/c Instructions-Education: Discharge instructions given to patient/family with verbalized understanding. D/c education completed with patient/family including follow up instructions, medication  list, d/c activities limitations if indicated, with other d/c instructions as indicated by MD - patient able to verbalize understanding, all questions fully answered. Patient instructed to return to ED, call 911, or call MD for any changes in condition.  Patient escorted via [place], and D/C home via private auto.  Zenaida Deed, RN 11/06/2019 8:20 PM

## 2019-11-06 NOTE — Care Management Important Message (Signed)
Important Message  Patient Details  Name: Kari Henson MRN: [id number] Date of Birth: Nov 17, 1965   Medicare Important Message Given:  Yes     Tommy Medal 11/06/2019, 11:38 AM

## 2019-11-06 NOTE — Discharge Instructions (Signed)
1)Avoid ibuprofen/Advil/Aleve/Motrin/Goody Powders/Naproxen/BC powders/Meloxicam/Diclofenac/Indomethacin and other Nonsteroidal anti-inflammatory medications as these will make you more likely to bleed and can cause stomach ulcers, can also cause Kidney problems.   2)you need oxygen at home at 2 L via nasal cannula continuously while awake and while asleep--- smoking or having open fires around oxygen can cause fire, significant injury and death  3) you will need repeat CBC blood work, renal function panel blood work including calcium, phosphorous, magnesium levels within the next 4 days  4) follow-up with nephrologist as advised

## 2019-11-06 NOTE — Progress Notes (Signed)
Admit: 11/02/2019 LOS: 4  31F severe hypercalcemia, AKI  Subjective:  Marland Kitchen GFR normalized . Ca normalized . Feels much improved . Hypercalcemia workup still in process   05/09 0701 - 05/10 0700 In: 1251.6 [P.O.:960; IV Piggyback:291.6] Out: 850 [Urine:850]  Filed Weights   11/03/19 0100 11/04/19 0300 11/06/19 0700  Weight: 59.4 kg 58 kg 58.2 kg    Scheduled Meds: . azaTHIOprine  50 mg Oral BID  . ferrous sulfate  325 mg Oral Q breakfast  . heparin  5,000 Units Subcutaneous Q8H  . hydroxychloroquine  200 mg Oral BID  . loratadine  10 mg Oral Daily  . multivitamin with minerals  1 tablet Oral Daily  . Nintedanib  150 mg Oral BID  . pantoprazole  40 mg Oral Daily  . phosphorus  250 mg Oral TID  . pneumococcal 23 valent vaccine  0.5 mL Intramuscular Tomorrow-1000   Continuous Infusions: PRN Meds:.acetaminophen **OR** acetaminophen, ondansetron **OR** ondansetron (ZOFRAN) IV  Current Labs: reviewed  PENDING: 1,25 Vit D, PTHrP, SPEP, SFLC, Vit A level   Ref. Range 11/04/2019 05:59  TSH Latest Ref Range: 0.350 - 4.500 uIU/mL 2.558    Ref. Range 11/02/2019 15:28  Vitamin D, 25-Hydroxy Latest Ref Range: 30 - 100 ng/mL 44.14    Ref. Range 11/02/2019 14:43  PTH, Intact Latest Ref Range: 15 - 65 pg/mL 8 (L)    Physical Exam:  Blood pressure 117/76, pulse (!) 108, temperature 98.9 F (37.2 C), temperature source Oral, resp. rate 18, height _0  (1.575 m), weight 58.2 kg, last menstrual period 07/01/2013, SpO2 97 %. NAD, looks improved RRR  Crackles throughout esp in bases No LEE No rashes/lesions S/nt/nd Nonfocal  A 1. Severe Hypercalcemia, PTH suppressed, w/u in process; resolved with calcitonin + zoledronic acid + IVFs 2. AKI 2/2 #1, resolved 3. Hx/o MGUS, rpt SPEP and SFLC pending 4. IPF  5. SLE on plaquenil and imuran; followed by Deveshwar  P . Await other studies . Will arrrange close outpt f/u at [place] . OK for DC   Pearson Grippe MD 11/06/2019, 11:50 AM  Recent  Labs  Lab 11/04/19 0559 11/05/19 0633 11/06/19 0600  NA 134* 133* 132*  K 3.4* 3.0* 3.7  CL 101 99 99  CO2 23 23 21*  GLUCOSE 66* 90 92  BUN _1 CREATININE 0.92 0.86 0.79  CALCIUM 10.4* 9.6 9.2  PHOS 1.9* 1.7* 1.7*   Recent Labs  Lab 11/02/19 1443 11/03/19 0450  WBC 8.3 7.0  HGB 12.5 9.6*  HCT 38.8 30.2*  MCV 85.3 86.0  PLT 305 257

## 2019-11-06 NOTE — Discharge Summary (Signed)
Kari Henson, is a 54 y.o. female  DOB 05-30-1966  MRN [id number].  Admission date:  11/02/2019  Admitting Physician  [place], DO  Discharge Date:  11/06/2019   Primary MD  [name], NP  Recommendations for primary care physician for things to follow:   1)Avoid ibuprofen/Advil/Aleve/Motrin/Goody Powders/Naproxen/BC powders/Meloxicam/Diclofenac/Indomethacin and other Nonsteroidal anti-inflammatory medications as these will make you more likely to bleed and can cause stomach ulcers, can also cause Kidney problems.   2)you need oxygen at home at 2 L via nasal cannula continuously while awake and while asleep--- smoking or having open fires around oxygen can cause fire, significant injury and death  3) you will need repeat CBC blood work, renal function panel blood work including calcium, phosphorous, magnesium levels within the next 4 days  4) follow-up with nephrologist as advised  Admission Diagnosis  Hypercalcemia [E83.52]   Discharge Diagnosis  Hypercalcemia [E83.52]    Principal Problem:   Hypercalcemia Active Problems:   SLE (systemic lupus erythematosus) (HCC)   ILD (interstitial lung disease) /UIP   Vitamin D deficiency   Other secondary pulmonary hypertension (HCC)      Past Medical History:  Diagnosis Date  . GERD (gastroesophageal reflux disease)   . Lung fibrosis ([place])    follows with a Dr. Ouida Sills, pulmonary in [place], [place]  . 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 Seasonal allergies   . SLE (systemic lupus erythematosus) ([place]) 2010   she was on MTX (d/c due to cytopenia).  She has been on Prednisone and Plaquenil    Past Surgical History:  Procedure Laterality Date  . CESAREAN SECTION    . COLONOSCOPY N/A 04/27/2014   SLF:  1. one colon polyp removed 2 The left colonis redundant 3. the examined terminal ileum apperared to be  normal 3. small  internal hemorrohoids.  . ESOPHAGOGASTRODUODENOSCOPY N/A 04/27/2014   SLF: 1. No obvious source for abdominal pain identified. 2. Non-erosive gastritis (inflammation) was found in the gastric antrum; multiple biopsies were performed.  Marland Kitchen HERNIA REPAIR    . MULTIPLE TOOTH EXTRACTIONS    . RIGHT HEART CATH N/A 10/20/2019   Procedure: RIGHT HEART CATH;  Surgeon: Sherren Mocha, MD;  Location: [facility];  Service: Cardiovascular;  Laterality: N/A;     HPI  from the history and physical done on the day of admission:    Chief Complaint: Bilateral lower extremity weakness, intermittent along with abnormal labs  HPI: Kari Henson is a 54 y.o. female with medical history significant for SLE, ILD with mild pulmonary hypertension, GERD, MGUS, and vitamin D deficiency who has been having bilateral intermittent lower extremity weakness over the last [redacted] weeks along with some mild hyperkalemia.  She has also had some weight loss of approximately 20 pounds in the last 1 month, as she has not eaten very much.  No night sweats or fevers noted.  She has had some occasional cough, but no chest pain or shortness of breath and no abdominal pain.  She has not been more confused either.  She was told to come to the ED due to her abnormal lab work with hypercalcemia noted by her cardiologist today.   ED Course: Vital signs demonstrate sinus tachycardia.  Her calcium levels are greater than 15 and are noted to be 14.8 after adjustment with her albumin.  Serum sodium is 131 and creatinine is 1.39 with baseline creatinine 0.5-0.6.  Chest x-ray with findings of fibrosis and no other acute findings noted.  2 L IV fluid bolus has been given and PTH has been ordered.  Review of Systems: All others reviewed as noted above and otherwise negative.    Hospital Course:   Brief Summary:- 54 y.o.femalewith medical history significant forSLE, ILD with mild pulmonary hypertension, GERD, MGUS, and vitamin D deficiency  who has been having bilateral intermittent lower extremity weakness over the last 2 weeks admitted on 11/03/2018 with hypercalcemia -Hypoxia persist    A/p 1)Hypercalcemia--- completed Zometa on 11/02/2019, started calcitonin on 11/03/19, -Corrected calcium down less to 10.0 from over 15 -PTH is 8 which is low, vitamin D25 is not elevated, -Calcitrol is elevated at 225 -TSH is 2.5 -No evidence of sarcoidosis or other granulomatous disease, -Nephrology consult appreciated -lost about 20 pound weight loss in the last month or so due to decreased oral intake -Outpatient follow-up with nephrology next week for further investigations  2)UIP and pulmonary hypertension --- continue Ofev, Discussed with  with patient's pulmonologist Dr. [name] -Recent echo with mild elevated PA pressures, recent RHC without significant abnormalities -Persistent dyspnea on exertion and hypoxia noted requiring continuous oxygen  3)AKI----acute kidney injury due to dehydration in setting of reduced oral intake -   creatinine on admission= 1.39 , baseline creatinine = 0.69 (10/26/19)   , creatinine is now= 0.79,  --renally adjust medications, avoid nephrotoxic agents / dehydration  / hypotension  4)SLE--continue Imuran and Plaquenil, patient sees rheumatologist Dr. Kris Mouton  5)MGUS--- patient sees rheumatologist outpatient, myeloma work-up in progress including SPEP and serum free light chains  6)H/o Vit D deficiency--- bone density was normal in 2018 ---PTH is 8 which is low, vitamin D25 is not elevated, -Calcitrol is elevated at 225  7)Hypomagnesemia/hypophosphatemia/hypokalemia --- replaced IV -Discharge home on oral Neutra-Phos and KCl -Repeat labs for electrolytes within 4 days as outpatient advised  8)Acute on Chronic normocytic hypochromic Anemia--- Hgb is down to 9.6 after hydration (baseline 11 to 12)--  9) acute hypoxic respiratory failure--- multifactorial including underlying UIP--- discharge  home on home oxygen follow-up with Dr. [name] pulmonologist as outpatient  Disposition Home with oxygen  Code Status : Full  Family Communication:   (patient is alert, awake and coherent) Discussed with her mother and boyfriend at bedside  Consults  : Nephrology   --phone consult with pulmonologist Dr. [name]   Discharge Condition: stable  Follow UP  Follow-up Information    [place] [place] Follow up in 1 week(s).   Why: We will call with appt information -you will need repeat CBC blood work, renal function panel blood work including calcium, phosphorous, magnesium levels within the next 4 days  Contact information: [place] [address] [phone]            Diet and Activity recommendation:  As advised  Discharge Instructions    Discharge Instructions    Call MD for:  difficulty breathing, headache or visual disturbances   Complete by: As directed    Call MD for:  extreme fatigue   Complete by: As directed  Call MD for:  persistant dizziness or light-headedness   Complete by: As directed    Call MD for:  persistant nausea and vomiting   Complete by: As directed    Call MD for:  severe uncontrolled pain   Complete by: As directed    Call MD for:  temperature >100.4   Complete by: As directed    Diet - low sodium heart healthy   Complete by: As directed    Discharge instructions   Complete by: As directed    1)Avoid ibuprofen/Advil/Aleve/Motrin/Goody Powders/Naproxen/BC powders/Meloxicam/Diclofenac/Indomethacin and other Nonsteroidal anti-inflammatory medications as these will make you more likely to bleed and can cause stomach ulcers, can also cause Kidney problems.   2)you need oxygen at home at 2 L via nasal cannula continuously while awake and while asleep--- smoking or having open fires around oxygen can cause fire, significant injury and death  3) you will need repeat CBC blood work, renal function panel blood work including calcium,  phosphorous, magnesium levels within the next 4 days  4) follow-up with nephrologist as advised   Increase activity slowly   Complete by: As directed         Discharge Medications     Allergies as of 11/06/2019   No Known Allergies     Medication List    STOP taking these medications   Vitamin D (Ergocalciferol) 1.25 MG (50000 UNIT) Caps capsule Commonly known as: DRISDOL     TAKE these medications   acetaminophen 500 MG tablet Commonly known as: TYLENOL Take 500-1,000 mg by mouth every 6 (six) hours as needed for mild pain or moderate pain.   azaTHIOprine 50 MG tablet Commonly known as: IMURAN Take 1 tablet (50 mg total) by mouth in the morning and at bedtime.   ferrous sulfate 324 MG Tbec Take 1 tablet (324 mg total) by mouth daily with breakfast.   HYDROcodone-acetaminophen 5-325 MG tablet Commonly known as: NORCO/VICODIN Take 1 tablet by mouth every 6 (six) hours as needed for moderate pain.   hydroxychloroquine 200 MG tablet Commonly known as: PLAQUENIL Take 1 tablet (200 mg total) by mouth 2 (two) times daily.   loratadine 10 MG tablet Commonly known as: CLARITIN Take 10 mg by mouth daily.   multivitamin with minerals Tabs tablet Take 1 tablet by mouth daily.   Ofev 150 MG Caps Generic drug: Nintedanib Take 150 mg by mouth 2 (two) times daily. q12 hrs   pantoprazole 40 MG tablet Commonly known as: PROTONIX Take 1 tablet (40 mg total) by mouth daily.   phosphorus 155-852-130 MG tablet Commonly known as: K PHOS NEUTRAL Take 1 tablet (250 mg total) by mouth 3 (three) times daily.   potassium chloride SA 20 MEQ tablet Commonly known as: KLOR-CON Take 1 tablet (20 mEq total) by mouth daily.            Durable Medical Equipment  (From admission, onward)         Start     Ordered   11/05/19 1646  For home use only DME oxygen  Once    Comments: SATURATION QUALIFICATIONS: (Thisnote is usedto comply with regulatory documentation for home  oxygen)  Patient Saturations on Room Air at Rest =94 %  Patient Saturations on Room Air while Ambulating =85%  Patient Saturations on2Liters of oxygen while Ambulating = 91 %    Patient needs continuous O2 at 2 L/min continuously via nasal cannula with humidifier, with gaseous portability and conserving device  Question Answer Comment  Length of Need Lifetime   Mode or (Route) Nasal cannula   Liters per Minute 2   Frequency Continuous (stationary and portable oxygen unit needed)   Oxygen conserving device Yes   Oxygen delivery system Gas      11/05/19 1647          Major procedures and Radiology Reports - PLEASE review detailed and final reports for all details, in brief -    DG Chest 2 View  Result Date: 11/05/2019 CLINICAL DATA:  Generalized weakness EXAM: CHEST - 2 VIEW COMPARISON:  Nov 02, 2019 chest radiograph and chest CT June 21, 2019 FINDINGS: There is widespread fibrosis throughout the lungs, essentially stable. A degree of patchy airspace opacity in the lower lung regions superimposed is difficult to exclude in this circumstance. Heart size and pulmonary vascularity are within normal limits. No adenopathy. No bone lesions. IMPRESSION: Extensive fibrosis. A degree of patchy superimposed airspace disease in the bases is difficult to exclude entirely in this circumstance. Appearance is essentially stable compared to 3 days prior. Cardiac silhouette within normal limits. No demonstrable adenopathy. Electronically Signed   By: Lowella Grip III M.D.   On: 11/05/2019 11:42   CARDIAC CATHETERIZATION  Result Date: 10/20/2019 Normal right heart pressures Normal cardiac output No evidence of pulmonary hypertension PA pressure 28/9 with mean of 19 mmHg RA pressure 3 with mean of 0 mmHg Pulmonary capillary wedge pressure A-wave 5, V wave 4, mean 3 mmHg Fick cardiac output 5.4 L/min  DG Chest Port 1 View  Result Date: 11/02/2019 CLINICAL DATA:  Cough EXAM: PORTABLE  CHEST 1 VIEW COMPARISON:  04/14/2019, CT chest 06/21/2019 FINDINGS: Extensive bilateral pulmonary fibrosis, basilar predominant. New patchy bilateral somewhat nodular appearing foci of airspace disease throughout the lungs. Stable cardiomediastinal silhouette. No pneumothorax. IMPRESSION: Extensive pulmonary fibrosis. New patchy bilateral slightly nodular appearing foci of airspace disease suspect for acute superimposed bilateral pneumonia, possibly atypical etiology. Electronically Signed   By: Donavan Foil M.D.   On: 11/02/2019 15:12    Micro Results    Recent Results (from the past 240 hour(s))  Respiratory Panel by RT PCR (Flu A&B, Covid) - Nasopharyngeal Swab     Status: None   Collection Time: 11/02/19  2:58 PM   Specimen: Nasopharyngeal Swab  Result Value Ref Range Status   SARS Coronavirus 2 by RT PCR NEGATIVE NEGATIVE Final    Comment: (NOTE) SARS-CoV-2 target nucleic acids are NOT DETECTED. The SARS-CoV-2 RNA is generally detectable in upper respiratoy specimens during the acute phase of infection. The lowest concentration of SARS-CoV-2 viral copies this assay can detect is 131 copies/mL. A negative result does not preclude SARS-Cov-2 infection and should not be used as the sole basis for treatment or other patient management decisions. A negative result may occur with  improper specimen collection/handling, submission of specimen other than nasopharyngeal swab, presence of viral mutation(s) within the areas targeted by this assay, and inadequate number of viral copies (<131 copies/mL). A negative result must be combined with clinical observations, patient history, and epidemiological information. The expected result is Negative. Fact Sheet for Patients:  PinkCheek.be Fact Sheet for Healthcare Providers:  GravelBags.it This test is not yet ap proved or cleared by the [place] FDA and  has been authorized for  detection and/or diagnosis of SARS-CoV-2 by FDA under an Emergency Use Authorization (EUA). This EUA will remain  in effect (meaning this test can be used) for the duration of the COVID-19 declaration under Section 564(b)(1) of the  Act, 21 U.S.C. section 360bbb-3(b)(1), unless the authorization is terminated or revoked sooner.    Influenza A by PCR NEGATIVE NEGATIVE Final   Influenza B by PCR NEGATIVE NEGATIVE Final    Comment: (NOTE) The Xpert Xpress SARS-CoV-2/FLU/RSV assay is intended as an aid in  the diagnosis of influenza from Nasopharyngeal swab specimens and  should not be used as a sole basis for treatment. Nasal washings and  aspirates are unacceptable for Xpert Xpress SARS-CoV-2/FLU/RSV  testing. Fact Sheet for Patients: PinkCheek.be Fact Sheet for Healthcare Providers: GravelBags.it This test is not yet approved or cleared by the [place] FDA and  has been authorized for detection and/or diagnosis of SARS-CoV-2 by  FDA under an Emergency Use Authorization (EUA). This EUA will remain  in effect (meaning this test can be used) for the duration of the  Covid-19 declaration under Section 564(b)(1) of the Act, 21  U.S.C. section 360bbb-3(b)(1), unless the authorization is  terminated or revoked. Performed at [facility], [address], [place], [place] [address]        Today   Subjective    Lagena Strand today has no new complaints -Hypoxia and dyspnea on exertion not worse          Patient has been seen and examined prior to discharge   Objective   Blood pressure 121/79, pulse 99, temperature 98.4 F (36.9 C), temperature source Oral, resp. rate 16, height _0  (1.575 m), weight 58.2 kg, last menstrual period 07/01/2013, SpO2 98 %.   Intake/Output Summary (Last 24 hours) at 11/06/2019 1833 Last data filed at 11/06/2019 1727 Gross per 24 hour  Intake 938.13 ml  Output 950 ml  Net -11.87 ml     Exam Gen:- Awake Alert,  In no apparent distress  HEENT:- [place].AT, No sclera icterus Nose- [place] 2L/min Neck-Supple Neck,No JVD,.  Lungs-Velcro type rales consistent with fibrosis , diminished breath sounds overall CV- S1, S2 normal, regular  Abd-  +ve B.Sounds, Abd Soft, No tenderness,    Extremity/Skin:- No  edema, pedal pulses present  Psych-affect is appropriate, oriented x3 Neuro-generalized weakness, no new focal deficits, no tremors   Data Review   CBC w Diff:  Lab Results  Component Value Date   WBC 7.0 11/03/2019   HGB 9.6 (L) 11/03/2019   HGB 12.8 08/03/2019   HCT 30.2 (L) 11/03/2019   HCT 40.8 08/03/2019   PLT 257 11/03/2019   PLT 280 08/03/2019   LYMPHOPCT 16 10/17/2019   MONOPCT 22 10/17/2019   EOSPCT 6 10/17/2019   BASOPCT 1 10/17/2019    CMP:  Lab Results  Component Value Date   NA 132 (L) 11/06/2019   NA 139 08/03/2019   K 3.7 11/06/2019   CL 99 11/06/2019   CO2 21 (L) 11/06/2019   BUN 8 11/06/2019   BUN 6 08/03/2019   CREATININE 0.79 11/06/2019   CREATININE 0.54 04/28/2019   PROT 5.8 (L) 11/03/2019   PROT 7.4 08/03/2019   ALBUMIN 3.0 (L) 11/06/2019   ALBUMIN 4.3 08/03/2019   BILITOT 0.6 11/03/2019   BILITOT 0.4 08/03/2019   BILITOT 0.4 03/14/2014   ALKPHOS 79 11/03/2019   ALKPHOS 114 03/14/2014   AST 34 11/03/2019   AST 29 03/14/2014   ALT 12 11/03/2019   ALT 23 02/19/2014  .   Total Discharge time is about 33 minutes  Roxan Hockey M.D on 11/06/2019 at 6:33 PM  Go to [email or web address] -  for contact info  [facility] - Office  [phone]

## 2019-11-06 NOTE — TOC Transition Note (Signed)
Transition of Care [facility]) - CM/SW Discharge Note   Patient Details  Name: Kari Henson MRN: [id number] Date of Birth: 05-Jun-1966  Transition of Care [facility]) CM/SW Contact:  Shade Flood, LCSW Phone Number: 11/06/2019, 2:36 PM   Clinical Narrative:     Pt stable for dc today per MD. Pt will return home with family. Pt needing Home O2 and HHRN. Spoke with pt who is in agreement. Discussed provider options with pt and referred as requested. Pt did not have any other TOC needs for dc.   Expected Discharge Plan: [place] Barriers to Discharge: Barriers Resolved   Patient Goals and CMS Choice Patient states their goals for this hospitalization and ongoing recovery are:: return home CMS Medicare.gov Compare Post Acute Care list provided to:: Patient Choice offered to / list presented to : Patient  Expected Discharge Plan and Services Expected Discharge Plan: [place] In-house Referral: Clinical Social Work   Post Acute Care Choice: [place] arrangements for the past 2 months: [place] Expected Discharge Date: 11/06/19               DME Arranged: Oxygen DME Agency: [employer] Date DME Agency Contacted: 11/06/19   Representative spoke with at DME Agency: [place] Constable [place] Arranged: RN [place] Agency: [place] ([place]) Date [place]: 11/06/19   Representative spoke with at [place]: Vaughan Basta  Prior Living Arrangements/Services Living arrangements for the past 2 months: [place] Lives with:: Spouse Patient language and need for interpreter reviewed:: Yes Do you feel safe going back to the place where you live?: Yes      Need for Family Participation in Patient Care: Yes (Comment) Care giver support system in place?: Yes (comment)   Criminal Activity/Legal Involvement Pertinent to Current Situation/Hospitalization: No - Comment as needed  Activities of Daily Living Home Assistive  Devices/Equipment: Eyeglasses ADL Screening (condition at time of admission) Patient's cognitive ability adequate to safely complete daily activities?: Yes Is the patient deaf or have difficulty hearing?: No Does the patient have difficulty seeing, even when wearing glasses/contacts?: No Does the patient have difficulty concentrating, remembering, or making decisions?: No Patient able to express need for assistance with ADLs?: Yes Does the patient have difficulty dressing or bathing?: No Independently performs ADLs?: Yes (appropriate for developmental age) Does the patient have difficulty walking or climbing stairs?: No Weakness of Legs: Both Weakness of Arms/Hands: Both  Permission Sought/Granted Permission sought to share information with : Chartered certified accountant granted to share information with : Yes, Verbal Permission Granted     Permission granted to share info w AGENCY: local HH        Emotional Assessment   Attitude/Demeanor/Rapport: Engaged Affect (typically observed): Pleasant Orientation: : Oriented to Self, Oriented to Place, Oriented to  Time, Oriented to Situation Alcohol / Substance Use: Not Applicable Psych Involvement: No (comment)  Admission diagnosis:  Hypercalcemia [E83.52] Patient Active Problem List   Diagnosis Date Noted  . Hypercalcemia 11/02/2019  . Other secondary pulmonary hypertension ([place]) 10/20/2019  . 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 Vitamin D deficiency 04/30/2017  . High risk medication use 04/30/2017  . History of bone density study 04/30/2017  . Chronic cough 07/24/2016  . ILD (interstitial lung disease) /UIP 07/24/2016  . Abdominal pain, epigastric 03/29/2014  . Encounter for screening colonoscopy 03/29/2014  . 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 SLE (systemic lupus  erythematosus) ([place])   . Lung fibrosis ([place])    PCP:  [name], NP Pharmacy:   [facility], [place] - [address] [place] [address] Phone: [phone] Fax: [phone]  [id number] [id number]: [phone]: [phone]    Social Determinants of Health (SDOH) Interventions    Readmission Risk Interventions Readmission Risk Prevention Plan 11/06/2019  Post Dischage Appt Complete  Medication Screening Complete  Transportation Screening Complete  Some recent data might be hidden      Final next level of care: Home w Home Health Services Barriers to Discharge: Barriers Resolved   Patient Goals and CMS Choice Patient states their goals for this hospitalization and ongoing recovery are:: return home CMS Medicare.gov Compare Post Acute Care list provided to:: Patient Choice offered to / list presented to : Patient  Discharge Placement                       Discharge Plan and Services In-house Referral: Clinical Social Work   Post Acute Care Choice: Home Health          DME Arranged: Oxygen DME Agency: [employer] Date DME Agency Contacted: 11/06/19   Representative spoke with at DME Agency: [place]anged: RN [facility] [facility] SurgeryAgency: [place] Date [place] 11/06/19   Representative spoke with at [place] Interventions     Readmission Risk Interventions Readmission Risk Prevention Plan 11/06/2019  Post Dischage Appt Complete  Medication Screening Complete  Transportation Screening Complete  Some recent data might be hidden

## 2019-11-06 NOTE — Progress Notes (Addendum)
SATURATION QUALIFICATIONS: (This note is used to comply with regulatory documentation for home oxygen)  Patient Saturations on Room Air at Rest = 92  Patient Saturations on Room Air while Ambulating = 86  Patient Saturations on Room Air when returned to bed =84  Patient Saturations on 2 Liters when returned to bed  = 94%  Please briefly explain why patient needs home oxygen:

## 2019-11-08 DIAGNOSIS — J8410 Pulmonary fibrosis, unspecified: Secondary | ICD-10-CM | POA: Diagnosis not present

## 2019-11-08 LAB — VITAMIN A: Vitamin A (Retinoic Acid): 12.3 ug/dL — ABNORMAL LOW (ref 20.1–62.0)

## 2019-11-09 LAB — PTH-RELATED PEPTIDE: PTH-related peptide: <2 pmol/L

## 2019-11-10 DIAGNOSIS — M329 Systemic lupus erythematosus, unspecified: Secondary | ICD-10-CM | POA: Diagnosis not present

## 2019-11-13 NOTE — Telephone Encounter (Signed)
Patient states she was in the hospital for the leg weakness. Patient states she had elevated potassium and low calcium. Patient states she has been discharge from the hospital. Patient was discharge home on oxygen. Patient states the leg weakness has improved. Patient states she follows up with PCP tomorrow. Patient states she was sent to the hospital by her heart doctor and then was admitted due to the abnormal labs.

## 2019-11-14 DIAGNOSIS — K2100 Gastro-esophageal reflux disease with esophagitis, without bleeding: Secondary | ICD-10-CM | POA: Diagnosis not present

## 2019-11-14 DIAGNOSIS — Z87891 Personal history of nicotine dependence: Secondary | ICD-10-CM | POA: Diagnosis not present

## 2019-11-14 DIAGNOSIS — L930 Discoid lupus erythematosus: Secondary | ICD-10-CM | POA: Diagnosis not present

## 2019-11-14 DIAGNOSIS — Z299 Encounter for prophylactic measures, unspecified: Secondary | ICD-10-CM | POA: Diagnosis not present

## 2019-11-15 DIAGNOSIS — J8410 Pulmonary fibrosis, unspecified: Secondary | ICD-10-CM | POA: Diagnosis not present

## 2019-12-04 DIAGNOSIS — N179 Acute kidney failure, unspecified: Secondary | ICD-10-CM | POA: Diagnosis not present

## 2019-12-08 DIAGNOSIS — E876 Hypokalemia: Secondary | ICD-10-CM | POA: Diagnosis not present

## 2019-12-08 DIAGNOSIS — I2723 Pulmonary hypertension due to lung diseases and hypoxia: Secondary | ICD-10-CM | POA: Diagnosis not present

## 2019-12-08 DIAGNOSIS — M329 Systemic lupus erythematosus, unspecified: Secondary | ICD-10-CM | POA: Diagnosis not present

## 2019-12-08 DIAGNOSIS — D472 Monoclonal gammopathy: Secondary | ICD-10-CM | POA: Diagnosis not present

## 2019-12-08 DIAGNOSIS — K219 Gastro-esophageal reflux disease without esophagitis: Secondary | ICD-10-CM | POA: Diagnosis not present

## 2019-12-08 DIAGNOSIS — Z87891 Personal history of nicotine dependence: Secondary | ICD-10-CM | POA: Diagnosis not present

## 2019-12-08 DIAGNOSIS — D649 Anemia, unspecified: Secondary | ICD-10-CM | POA: Diagnosis not present

## 2019-12-08 DIAGNOSIS — R634 Abnormal weight loss: Secondary | ICD-10-CM | POA: Diagnosis not present

## 2019-12-08 DIAGNOSIS — E559 Vitamin D deficiency, unspecified: Secondary | ICD-10-CM | POA: Diagnosis not present

## 2019-12-08 DIAGNOSIS — J8410 Pulmonary fibrosis, unspecified: Secondary | ICD-10-CM | POA: Diagnosis not present

## 2019-12-08 DIAGNOSIS — N179 Acute kidney failure, unspecified: Secondary | ICD-10-CM | POA: Diagnosis not present

## 2019-12-12 DIAGNOSIS — J8410 Pulmonary fibrosis, unspecified: Secondary | ICD-10-CM | POA: Diagnosis not present

## 2019-12-12 DIAGNOSIS — E876 Hypokalemia: Secondary | ICD-10-CM | POA: Diagnosis not present

## 2019-12-12 DIAGNOSIS — I2723 Pulmonary hypertension due to lung diseases and hypoxia: Secondary | ICD-10-CM | POA: Diagnosis not present

## 2019-12-12 DIAGNOSIS — K219 Gastro-esophageal reflux disease without esophagitis: Secondary | ICD-10-CM | POA: Diagnosis not present

## 2019-12-14 ENCOUNTER — Telehealth: Payer: Self-pay | Admitting: *Deleted

## 2019-12-14 NOTE — Telephone Encounter (Signed)
Received office notes from [facility] which included lab results.  Visit on 12/04/2019 Reviewed by Dr. Estanislado Pandy  Intact PTH 13 Vitamin D 100  SPEP: Alpha 2 Globulin 1.7, Beta 2 Globulin 1.7, M-Spike 1.0 Globulin total 4.7 Immunoglobulin M 1410 Sodium 130 Cholride 94 Calcium 10.5 Albumin 3.7

## 2019-12-21 DIAGNOSIS — E876 Hypokalemia: Secondary | ICD-10-CM | POA: Diagnosis not present

## 2019-12-21 DIAGNOSIS — J8410 Pulmonary fibrosis, unspecified: Secondary | ICD-10-CM | POA: Diagnosis not present

## 2019-12-21 DIAGNOSIS — I2723 Pulmonary hypertension due to lung diseases and hypoxia: Secondary | ICD-10-CM | POA: Diagnosis not present

## 2019-12-21 DIAGNOSIS — K219 Gastro-esophageal reflux disease without esophagitis: Secondary | ICD-10-CM | POA: Diagnosis not present

## 2019-12-22 ENCOUNTER — Other Ambulatory Visit: Payer: Self-pay

## 2019-12-22 ENCOUNTER — Inpatient Hospital Stay (HOSPITAL_COMMUNITY): Payer: Medicare Other

## 2019-12-22 ENCOUNTER — Inpatient Hospital Stay (HOSPITAL_COMMUNITY): Payer: Medicare Other | Attending: Nurse Practitioner | Admitting: Nurse Practitioner

## 2019-12-22 ENCOUNTER — Encounter (HOSPITAL_COMMUNITY): Payer: Self-pay | Admitting: Nurse Practitioner

## 2019-12-22 VITALS — BP 105/73 | HR 105 | Temp 97.8°F | Resp 17 | Ht 62.0 in | Wt 104.3 lb

## 2019-12-22 DIAGNOSIS — D472 Monoclonal gammopathy: Secondary | ICD-10-CM | POA: Insufficient documentation

## 2019-12-22 DIAGNOSIS — Z87891 Personal history of nicotine dependence: Secondary | ICD-10-CM | POA: Insufficient documentation

## 2019-12-22 DIAGNOSIS — D649 Anemia, unspecified: Secondary | ICD-10-CM | POA: Insufficient documentation

## 2019-12-22 DIAGNOSIS — M329 Systemic lupus erythematosus, unspecified: Secondary | ICD-10-CM | POA: Insufficient documentation

## 2019-12-22 DIAGNOSIS — J8410 Pulmonary fibrosis, unspecified: Secondary | ICD-10-CM | POA: Insufficient documentation

## 2019-12-22 DIAGNOSIS — R634 Abnormal weight loss: Secondary | ICD-10-CM

## 2019-12-22 DIAGNOSIS — Z993 Dependence on wheelchair: Secondary | ICD-10-CM | POA: Diagnosis not present

## 2019-12-22 DIAGNOSIS — N289 Disorder of kidney and ureter, unspecified: Secondary | ICD-10-CM | POA: Insufficient documentation

## 2019-12-22 DIAGNOSIS — Z9981 Dependence on supplemental oxygen: Secondary | ICD-10-CM | POA: Diagnosis not present

## 2019-12-22 DIAGNOSIS — K219 Gastro-esophageal reflux disease without esophagitis: Secondary | ICD-10-CM | POA: Insufficient documentation

## 2019-12-22 LAB — CBC WITH DIFFERENTIAL/PLATELET
Abs Immature Granulocytes: 0.03 10*3/uL (ref 0.00–0.07)
Basophils Absolute: 0 10*3/uL (ref 0.0–0.1)
Basophils Relative: 1 %
Eosinophils Absolute: 0 10*3/uL (ref 0.0–0.5)
Eosinophils Relative: 0 %
HCT: 30.2 % — ABNORMAL LOW (ref 36.0–46.0)
Hemoglobin: 9.7 g/dL — ABNORMAL LOW (ref 12.0–15.0)
Immature Granulocytes: 1 %
Lymphocytes Relative: 10 %
Lymphs Abs: 0.6 10*3/uL — ABNORMAL LOW (ref 0.7–4.0)
MCH: 27.6 pg (ref 26.0–34.0)
MCHC: 32.1 g/dL (ref 30.0–36.0)
MCV: 86 fL (ref 80.0–100.0)
Monocytes Absolute: 1.2 10*3/uL — ABNORMAL HIGH (ref 0.1–1.0)
Monocytes Relative: 18 %
Neutro Abs: 4.5 10*3/uL (ref 1.7–7.7)
Neutrophils Relative %: 70 %
Platelets: 268 10*3/uL (ref 150–400)
RBC: 3.51 MIL/uL — ABNORMAL LOW (ref 3.87–5.11)
RDW: 16.5 % — ABNORMAL HIGH (ref 11.5–15.5)
WBC: 6.4 10*3/uL (ref 4.0–10.5)
nRBC: 0 % (ref 0.0–0.2)

## 2019-12-22 LAB — FERRITIN: Ferritin: 1041 ng/mL — ABNORMAL HIGH (ref 11–307)

## 2019-12-22 LAB — IRON AND TIBC
Iron: 63 ug/dL (ref 28–170)
Saturation Ratios: 32 % — ABNORMAL HIGH (ref 10.4–31.8)
TIBC: 195 ug/dL — ABNORMAL LOW (ref 250–450)
UIBC: 132 ug/dL

## 2019-12-22 LAB — COMPREHENSIVE METABOLIC PANEL
ALT: 13 U/L (ref 0–44)
AST: 37 U/L (ref 15–41)
Albumin: 2.8 g/dL — ABNORMAL LOW (ref 3.5–5.0)
Alkaline Phosphatase: 106 U/L (ref 38–126)
Anion gap: 13 (ref 5–15)
BUN: 20 mg/dL (ref 6–20)
CO2: 21 mmol/L — ABNORMAL LOW (ref 22–32)
Calcium: 9 mg/dL (ref 8.9–10.3)
Chloride: 91 mmol/L — ABNORMAL LOW (ref 98–111)
Creatinine, Ser: 0.98 mg/dL (ref 0.44–1.00)
GFR calc Af Amer: >60 mL/min (ref >60)
GFR calc non Af Amer: >60 mL/min (ref >60)
Glucose, Bld: 107 mg/dL — ABNORMAL HIGH (ref 70–99)
Potassium: 4.2 mmol/L (ref 3.5–5.1)
Sodium: 125 mmol/L — ABNORMAL LOW (ref 135–145)
Total Bilirubin: 0.4 mg/dL (ref 0.3–1.2)
Total Protein: 7.7 g/dL (ref 6.5–8.1)

## 2019-12-22 LAB — LACTATE DEHYDROGENASE: LDH: 371 U/L — ABNORMAL HIGH (ref 98–192)

## 2019-12-22 LAB — VITAMIN B12: Vitamin B-12: 495 pg/mL (ref 180–914)

## 2019-12-22 LAB — VITAMIN D 25 HYDROXY (VIT D DEFICIENCY, FRACTURES): Vit D, 25-Hydroxy: 42.4 ng/mL (ref 30–100)

## 2019-12-22 NOTE — Progress Notes (Signed)
[facility] NOTE  Patient Care Team: [name], NP as PCP - General (Nurse Practitioner) Monico Blitz, MD as Referring Physician (Internal Medicine) Bo Merino, MD (Rheumatology) Everardo All, MD (Hematology and Oncology) Danie Binder, MD (Inactive) as Consulting Physician (Gastroenterology)  CHIEF COMPLAINTS/PURPOSE OF CONSULTATION: Elevated M spike  HISTORY OF PRESENTING ILLNESS:  Kari Henson 54 y.o. female was sent here by her nephrologist for elevated M spike.  Patient was found to have an M spike of 0.4 g/dL on 11/03/2019.  On Nov 05, 2019 patient was admitted to the hospital for acute kidney injury.  She also had hypercalcemia.  Her creatinine was 1.39.  Calcium level was greater than 15.  This initiated a consult to be worked up for MGUS.  Her creatinine has now come down to 0.98 and calcium 9.0.  She also has a diagnosis of lupus and sees her rheumatoid allergist frequently.  She reports a 45 pound weight loss in the past 4 to 5 months.  She reports it is due to decreased appetite.  She denies any B symptoms including fevers, chills and night sweats.  She denies any new bone pain.  She is a former smoker from the age of 55 and stopped in 2012 due to scratchy sore throat and chest pain with smoking.  She denies any alcohol or illicit drug use.  She has a negative family history for any type of cancer.  She is wheelchair-bound and uses home oxygen.  MEDICAL HISTORY:  Past Medical History:  Diagnosis Date  . GERD (gastroesophageal reflux disease)   . Lung fibrosis ([place])    follows with a Dr. Ouida Sills, pulmonary in [place], [place]  . 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 Seasonal allergies   . SLE (systemic lupus erythematosus) ([place]) 2010   she was on MTX (d/c due to cytopenia).  She has been on Prednisone and Plaquenil    SURGICAL HISTORY: Past Surgical History:  Procedure Laterality Date  . CESAREAN SECTION    .  CHOLECYSTECTOMY    . COLONOSCOPY N/A 04/27/2014   SLF:  1. one colon polyp removed 2 The left colonis redundant 3. the examined terminal ileum apperared to be  normal 3. small internal hemorrohoids.  . ESOPHAGOGASTRODUODENOSCOPY N/A 04/27/2014   SLF: 1. No obvious source for abdominal pain identified. 2. Non-erosive gastritis (inflammation) was found in the gastric antrum; multiple biopsies were performed.  Marland Kitchen HERNIA REPAIR    . MULTIPLE TOOTH EXTRACTIONS    . RIGHT HEART CATH N/A 10/20/2019   Procedure: RIGHT HEART CATH;  Surgeon: Sherren Mocha, MD;  Location: [facility];  Service: Cardiovascular;  Laterality: N/A;    SOCIAL HISTORY: Social History   Socioeconomic History  . Marital status: Single    Spouse name: Not on file  . Number of children: 1  . Years of education: Not on file  . Highest education level: Not on file  Occupational History    Comment: used to work in [employer]; now applying for disability due to SLE  Tobacco Use  . Smoking status: Former Smoker    Packs/day: 2.00    Years: 28.00    Pack years: 56.00    Types: Cigarettes    Quit date: 08/31/2011    Years since quitting: 8.3  . Smokeless tobacco: Never Used  Vaping Use  . Vaping Use: Never used  Substance and Sexual Activity  . Alcohol use: No    Alcohol/week: 0.0 standard drinks  .  Drug use: Never  . Sexual activity: Yes  Other Topics Concern  . Not on file  Social History Narrative  . Not on file   Social Determinants of Health   Financial Resource Strain:   . Difficulty of Paying Living Expenses:   Food Insecurity:   . Worried About Charity fundraiser in the Last Year:   . Arboriculturist in the Last Year:   Transportation Needs:   . Film/video editor (Medical):   Marland Kitchen Lack of Transportation (Non-Medical):   Physical Activity:   . Days of Exercise per Week:   . Minutes of Exercise per Session:   Stress:   . Feeling of Stress :   Social Connections:   . Frequency of Communication  with Friends and Family:   . Frequency of Social Gatherings with Friends and Family:   . Attends Religious Services:   . Active Member of Clubs or Organizations:   . Attends Archivist Meetings:   Marland Kitchen Marital Status:   Intimate Partner Violence:   . Fear of Current or Ex-Partner:   . Emotionally Abused:   Marland Kitchen Physically Abused:   . Sexually Abused:     FAMILY HISTORY: Family History  Problem Relation Age of Onset  . Asthma Mother   . Thyroid disease Mother   . Hypertension Mother   . Cirrhosis Father   . Alcohol abuse Father   . Diabetes Father   . COPD Father   . Diabetes Maternal Aunt   . Hypertension Sister   . Healthy Brother   . Diabetes Maternal Uncle   . Diabetes Paternal Aunt   . Hypertension Maternal Grandmother   . COPD Maternal Grandmother   . Arthritis Maternal Grandmother        rheumatoid arthritis  . Healthy Son   . Colon cancer Neg Hx     ALLERGIES:  has No Known Allergies.  MEDICATIONS:  Current Outpatient Medications  Medication Sig Dispense Refill  . azaTHIOprine (IMURAN) 50 MG tablet Take 1 tablet (50 mg total) by mouth in the morning and at bedtime. (Patient taking differently: Take 50 mg by mouth 2 (two) times daily. ) 60 tablet 2  . ferrous sulfate 324 MG TBEC Take 1 tablet (324 mg total) by mouth daily with breakfast. 30 tablet 3  . hydroxychloroquine (PLAQUENIL) 200 MG tablet Take 1 tablet (200 mg total) by mouth 2 (two) times daily. 60 tablet 0  . loratadine (CLARITIN) 10 MG tablet Take 10 mg by mouth daily.    . Multiple Vitamin (MULTIVITAMIN WITH MINERALS) TABS tablet Take 1 tablet by mouth daily.    . Nintedanib (OFEV) 150 MG CAPS Take 150 mg by mouth 2 (two) times daily. q12 hrs    . pantoprazole (PROTONIX) 40 MG tablet Take 1 tablet (40 mg total) by mouth daily. 30 tablet 3  . potassium chloride SA (KLOR-CON) 20 MEQ tablet Take 1 tablet (20 mEq total) by mouth daily. (Patient taking differently: Take 20 mEq by mouth 2 (two) times  daily. ) 90 tablet 3  . acetaminophen (TYLENOL) 500 MG tablet Take 500-1,000 mg by mouth every 6 (six) hours as needed for mild pain or moderate pain.  (Patient not taking: Reported on 12/22/2019)    . HYDROcodone-acetaminophen (NORCO/VICODIN) 5-325 MG tablet Take 1 tablet by mouth every 6 (six) hours as needed for moderate pain. (Patient not taking: Reported on 12/22/2019) 12 tablet 0   No current facility-administered medications for this visit.  REVIEW OF SYSTEMS:   Constitutional: Denies fevers, chills or abnormal night sweats Respiratory: Denies wheezes, + cough and shortness of breath Cardiovascular: Denies palpitation or chest discomfort + lower extremity swelling Gastrointestinal:  Denies heartburn or change in bowel habits, + nausea Skin: Denies abnormal skin rashes Lymphatics: Denies new lymphadenopathy or easy bruising Neurological:Denies numbness, tingling or new weaknesses Behavioral/Psych: Mood is stable, no new changes  All other systems were reviewed with the patient and are negative.  PHYSICAL EXAMINATION: ECOG PERFORMANCE STATUS: 1 - Symptomatic but completely ambulatory  Vitals:   12/22/19 1008  BP: 105/73  Pulse: (!) 105  Resp: 17  Temp: 97.8 F (36.6 C)  SpO2: 98%   Filed Weights   12/22/19 1008  Weight: 104 lb 4.8 oz (47.3 kg)    GENERAL:alert, no distress and comfortable SKIN: skin color, texture, turgor are normal, no rashes or significant lesions NECK: supple, thyroid normal size, non-tender, without nodularity LYMPH:  no palpable lymphadenopathy in the cervical, axillary or inguinal LUNGS: clear to auscultation and percussion with normal breathing effort HEART: regular rate & rhythm and no murmurs and no lower extremity edema ABDOMEN:abdomen soft, non-tender and normal bowel sounds Musculoskeletal:no cyanosis of digits and no clubbing  PSYCH: alert & oriented x 3 with fluent speech NEURO: no focal motor/sensory deficits  LABORATORY DATA:  I  have reviewed the data as listed Lab Results  Component Value Date   WBC 6.4 12/22/2019   HGB 9.7 (L) 12/22/2019   HCT 30.2 (L) 12/22/2019   MCV 86.0 12/22/2019   PLT 268 12/22/2019     Chemistry      Component Value Date/Time   NA 125 (L) 12/22/2019 1130   NA 139 08/03/2019 0852   K 4.2 12/22/2019 1130   CL 91 (L) 12/22/2019 1130   CO2 21 (L) 12/22/2019 1130   BUN 20 12/22/2019 1130   BUN 6 08/03/2019 0852   CREATININE 0.98 12/22/2019 1130   CREATININE 0.54 04/28/2019 1331      Component Value Date/Time   CALCIUM 9.0 12/22/2019 1130   ALKPHOS 106 12/22/2019 1130   ALKPHOS 114 03/14/2014 0000   AST 37 12/22/2019 1130   AST 29 03/14/2014 0000   ALT 13 12/22/2019 1130   ALT 23 02/19/2014 0000   BILITOT 0.4 12/22/2019 1130   BILITOT 0.4 08/03/2019 0852   BILITOT 0.4 03/14/2014 0000       RADIOGRAPHIC STUDIES: I have personally reviewed the radiological images as listed and agreed with the findings in the report. No results found.  ASSESSMENT & PLAN:  No problem-specific Assessment & Plan notes found for this encounter.  Orders Placed This Encounter  Procedures  . DG Bone Survey Met    Standing Status:   Future    Standing Expiration Date:   01/21/2020    Order Specific Question:   Reason for Exam (SYMPTOM  OR DIAGNOSIS REQUIRED)    Answer:   MGUS    Order Specific Question:   Preferred imaging location?    Answer:   [facility]    Order Specific Question:   Radiology Contrast Protocol - do NOT remove file path    Answer:   [email or web address]    Order Specific Question:   Is patient pregnant?    Answer:   No  . IgG, IgA, IgM    Standing Status:   Future    Number of Occurrences:   1    Standing Expiration Date:   12/21/2020  . Beta 2  microglobulin, serum    Standing Status:   Future    Number of Occurrences:   1    Standing Expiration Date:   12/21/2020  . Lactate dehydrogenase    Standing Status:   Future     Number of Occurrences:   1    Standing Expiration Date:   12/21/2020  . Protein electrophoresis, serum    Standing Status:   Future    Number of Occurrences:   1    Standing Expiration Date:   12/21/2020  . Immunofixation electrophoresis    Standing Status:   Future    Number of Occurrences:   1    Standing Expiration Date:   12/21/2020  . Kappa/lambda light chains    Standing Status:   Future    Number of Occurrences:   1    Standing Expiration Date:   12/21/2020  . Methylmalonic acid, serum    Standing Status:   Future    Number of Occurrences:   1    Standing Expiration Date:   12/21/2020  . CBC with Differential/Platelet    Standing Status:   Future    Number of Occurrences:   1    Standing Expiration Date:   12/21/2020  . Comprehensive metabolic panel    Standing Status:   Future    Number of Occurrences:   1    Standing Expiration Date:   12/21/2020  . Ferritin    Standing Status:   Future    Number of Occurrences:   1    Standing Expiration Date:   12/21/2020  . Iron and TIBC    Standing Status:   Future    Number of Occurrences:   1    Standing Expiration Date:   12/21/2020  . Vitamin B12    Standing Status:   Future    Number of Occurrences:   1    Standing Expiration Date:   12/21/2020  . VITAMIN D 25 Hydroxy (Vit-D Deficiency, Fractures)    Standing Status:   Future    Number of Occurrences:   1    Standing Expiration Date:   12/21/2020    All questions were answered. The patient knows to call the clinic with any problems, questions or concerns. The total time spent in the appointment was 60 minutes and more than 50% was on counseling.     Glennie Isle, NP-C 12/22/2019 12:13 PM  ADDENDUM:  Hematology/Oncology Attending: I had a face-to-face encounter with the patient through video virtual visit.  I recommended her care plan and I agree with the above note.  This is a very pleasant 54 years old African-American female who was referred from her nephrologist for  evaluation of M spike.  The patient has multiple medical problems including systemic lupus erythematosus as well as pulmonary fibrosis and GERD.  During recent routine blood work the patient had serum protein electrophoresis that showed M spike of 0.4.  She had serum light chain performed that showed mild elevation of the free kappa light chain.  Previous blood work also showed renal insufficiency in addition to hypercalcemia and anemia. I had a lengthy discussion with the patient today about her current condition and further investigation to rule out any underlying multiple myeloma.  We will repeat her myeloma panel as well as iron study and ferritin because of the persistent anemia. We will arrange for the patient to see Dr. Delton Coombes after he returns from his summer vacation for further evaluation and  recommendation regarding skeletal bone survey or bone marrow biopsy and aspirate to rule out underlying multiple myeloma if needed. The patient was advised to call immediately if she has any concerning symptoms in the interval.  Disclaimer: This note was dictated with voice recognition software. Similar sounding words can inadvertently be transcribed and may be missed upon review. Kari Kempf, MD 12/22/19

## 2019-12-24 LAB — IGG, IGA, IGM
IgA: 261 mg/dL (ref 87–352)
IgG (Immunoglobin G), Serum: 767 mg/dL (ref 586–1602)
IgM (Immunoglobulin M), Srm: 1131 mg/dL — ABNORMAL HIGH (ref 26–217)

## 2019-12-25 LAB — IMMUNOFIXATION ELECTROPHORESIS
IgA: 252 mg/dL (ref 87–352)
IgG (Immunoglobin G), Serum: 1471 mg/dL (ref 586–1602)
IgM (Immunoglobulin M), Srm: 2572 mg/dL — ABNORMAL HIGH (ref 26–217)
Total Protein ELP: 8.1 g/dL (ref 6.0–8.5)

## 2019-12-25 LAB — METHYLMALONIC ACID, SERUM: Methylmalonic Acid, Quantitative: 162 nmol/L (ref 0–378)

## 2019-12-26 ENCOUNTER — Ambulatory Visit (HOSPITAL_COMMUNITY)
Admission: RE | Admit: 2019-12-26 | Discharge: 2019-12-26 | Disposition: A | Discharge status: Home / Self Care | Payer: Medicare Other | Source: Ambulatory Visit | Attending: Nurse Practitioner | Admitting: Nurse Practitioner

## 2019-12-26 ENCOUNTER — Other Ambulatory Visit: Payer: Self-pay

## 2019-12-26 DIAGNOSIS — D472 Monoclonal gammopathy: Secondary | ICD-10-CM

## 2019-12-26 LAB — PROTEIN ELECTROPHORESIS, SERUM
A/G Ratio: 0.8 (ref 0.7–1.7)
Albumin ELP: 3.5 g/dL (ref 2.9–4.4)
Alpha-1-Globulin: 0.4 g/dL (ref 0.0–0.4)
Alpha-2-Globulin: 1.4 g/dL — ABNORMAL HIGH (ref 0.4–1.0)
Beta Globulin: 1.9 g/dL — ABNORMAL HIGH (ref 0.7–1.3)
Gamma Globulin: 0.6 g/dL (ref 0.4–1.8)
Globulin, Total: 4.3 g/dL — ABNORMAL HIGH (ref 2.2–3.9)
M-Spike, %: 0.7 g/dL — ABNORMAL HIGH
Total Protein ELP: 7.8 g/dL (ref 6.0–8.5)

## 2019-12-26 LAB — BETA 2 MICROGLOBULIN, SERUM: Beta-2 Microglobulin: 6.6 mg/L — ABNORMAL HIGH (ref 0.6–2.4)

## 2019-12-27 DIAGNOSIS — E876 Hypokalemia: Secondary | ICD-10-CM | POA: Diagnosis not present

## 2019-12-27 DIAGNOSIS — J8410 Pulmonary fibrosis, unspecified: Secondary | ICD-10-CM | POA: Diagnosis not present

## 2019-12-27 DIAGNOSIS — K219 Gastro-esophageal reflux disease without esophagitis: Secondary | ICD-10-CM | POA: Diagnosis not present

## 2019-12-27 DIAGNOSIS — I2723 Pulmonary hypertension due to lung diseases and hypoxia: Secondary | ICD-10-CM | POA: Diagnosis not present

## 2019-12-28 ENCOUNTER — Other Ambulatory Visit (HOSPITAL_COMMUNITY): Payer: Self-pay | Admitting: *Deleted

## 2019-12-28 DIAGNOSIS — D472 Monoclonal gammopathy: Secondary | ICD-10-CM

## 2019-12-29 ENCOUNTER — Inpatient Hospital Stay (HOSPITAL_COMMUNITY): Payer: Medicare Other | Attending: Hematology

## 2019-12-29 ENCOUNTER — Other Ambulatory Visit: Payer: Self-pay

## 2019-12-29 DIAGNOSIS — D472 Monoclonal gammopathy: Secondary | ICD-10-CM

## 2020-01-01 DIAGNOSIS — M359 Systemic involvement of connective tissue, unspecified: Secondary | ICD-10-CM | POA: Diagnosis not present

## 2020-01-01 DIAGNOSIS — E162 Hypoglycemia, unspecified: Secondary | ICD-10-CM | POA: Diagnosis not present

## 2020-01-01 DIAGNOSIS — J9601 Acute respiratory failure with hypoxia: Secondary | ICD-10-CM | POA: Diagnosis not present

## 2020-01-01 DIAGNOSIS — Z66 Do not resuscitate: Secondary | ICD-10-CM | POA: Diagnosis not present

## 2020-01-01 DIAGNOSIS — J9622 Acute and chronic respiratory failure with hypercapnia: Secondary | ICD-10-CM | POA: Diagnosis present

## 2020-01-01 DIAGNOSIS — I2694 Multiple subsegmental pulmonary emboli without acute cor pulmonale: Secondary | ICD-10-CM | POA: Diagnosis not present

## 2020-01-01 DIAGNOSIS — R531 Weakness: Secondary | ICD-10-CM | POA: Diagnosis not present

## 2020-01-01 DIAGNOSIS — G729 Myopathy, unspecified: Secondary | ICD-10-CM | POA: Diagnosis not present

## 2020-01-01 DIAGNOSIS — D84821 Immunodeficiency due to drugs: Secondary | ICD-10-CM | POA: Diagnosis present

## 2020-01-01 DIAGNOSIS — R9431 Abnormal electrocardiogram [ECG] [EKG]: Secondary | ICD-10-CM | POA: Diagnosis not present

## 2020-01-01 DIAGNOSIS — N179 Acute kidney failure, unspecified: Secondary | ICD-10-CM | POA: Diagnosis not present

## 2020-01-01 DIAGNOSIS — Z4659 Encounter for fitting and adjustment of other gastrointestinal appliance and device: Secondary | ICD-10-CM | POA: Diagnosis not present

## 2020-01-01 DIAGNOSIS — R532 Functional quadriplegia: Secondary | ICD-10-CM | POA: Diagnosis present

## 2020-01-01 DIAGNOSIS — I5021 Acute systolic (congestive) heart failure: Secondary | ICD-10-CM | POA: Diagnosis not present

## 2020-01-01 DIAGNOSIS — Z9911 Dependence on respirator [ventilator] status: Secondary | ICD-10-CM | POA: Diagnosis not present

## 2020-01-01 DIAGNOSIS — Z515 Encounter for palliative care: Secondary | ICD-10-CM | POA: Diagnosis not present

## 2020-01-01 DIAGNOSIS — I82612 Acute embolism and thrombosis of superficial veins of left upper extremity: Secondary | ICD-10-CM | POA: Diagnosis not present

## 2020-01-01 DIAGNOSIS — E860 Dehydration: Secondary | ICD-10-CM | POA: Diagnosis present

## 2020-01-01 DIAGNOSIS — I509 Heart failure, unspecified: Secondary | ICD-10-CM | POA: Diagnosis not present

## 2020-01-01 DIAGNOSIS — J9620 Acute and chronic respiratory failure, unspecified whether with hypoxia or hypercapnia: Secondary | ICD-10-CM | POA: Diagnosis not present

## 2020-01-01 DIAGNOSIS — I808 Phlebitis and thrombophlebitis of other sites: Secondary | ICD-10-CM | POA: Diagnosis not present

## 2020-01-01 DIAGNOSIS — B258 Other cytomegaloviral diseases: Secondary | ICD-10-CM | POA: Diagnosis not present

## 2020-01-01 DIAGNOSIS — J440 Chronic obstructive pulmonary disease with acute lower respiratory infection: Secondary | ICD-10-CM | POA: Diagnosis present

## 2020-01-01 DIAGNOSIS — E8779 Other fluid overload: Secondary | ICD-10-CM | POA: Diagnosis not present

## 2020-01-01 DIAGNOSIS — R000 Tachycardia, unspecified: Secondary | ICD-10-CM | POA: Diagnosis not present

## 2020-01-01 DIAGNOSIS — J150 Pneumonia due to Klebsiella pneumoniae: Secondary | ICD-10-CM | POA: Diagnosis not present

## 2020-01-01 DIAGNOSIS — I959 Hypotension, unspecified: Secondary | ICD-10-CM | POA: Diagnosis not present

## 2020-01-01 DIAGNOSIS — D8489 Other immunodeficiencies: Secondary | ICD-10-CM | POA: Diagnosis not present

## 2020-01-01 DIAGNOSIS — M329 Systemic lupus erythematosus, unspecified: Secondary | ICD-10-CM | POA: Diagnosis not present

## 2020-01-01 DIAGNOSIS — Z20822 Contact with and (suspected) exposure to covid-19: Secondary | ICD-10-CM | POA: Diagnosis present

## 2020-01-01 DIAGNOSIS — Z781 Physical restraint status: Secondary | ICD-10-CM | POA: Diagnosis not present

## 2020-01-01 DIAGNOSIS — E43 Unspecified severe protein-calorie malnutrition: Secondary | ICD-10-CM | POA: Diagnosis present

## 2020-01-01 DIAGNOSIS — B259 Cytomegaloviral disease, unspecified: Secondary | ICD-10-CM | POA: Diagnosis present

## 2020-01-01 DIAGNOSIS — I130 Hypertensive heart and chronic kidney disease with heart failure and stage 1 through stage 4 chronic kidney disease, or unspecified chronic kidney disease: Secondary | ICD-10-CM | POA: Diagnosis present

## 2020-01-01 DIAGNOSIS — E871 Hypo-osmolality and hyponatremia: Secondary | ICD-10-CM | POA: Diagnosis present

## 2020-01-01 DIAGNOSIS — R29898 Other symptoms and signs involving the musculoskeletal system: Secondary | ICD-10-CM | POA: Diagnosis not present

## 2020-01-01 DIAGNOSIS — J156 Pneumonia due to other aerobic Gram-negative bacteria: Secondary | ICD-10-CM | POA: Diagnosis present

## 2020-01-01 DIAGNOSIS — J9621 Acute and chronic respiratory failure with hypoxia: Secondary | ICD-10-CM | POA: Diagnosis present

## 2020-01-01 DIAGNOSIS — Z4682 Encounter for fitting and adjustment of non-vascular catheter: Secondary | ICD-10-CM | POA: Diagnosis not present

## 2020-01-01 DIAGNOSIS — E875 Hyperkalemia: Secondary | ICD-10-CM | POA: Diagnosis not present

## 2020-01-01 DIAGNOSIS — Z9981 Dependence on supplemental oxygen: Secondary | ICD-10-CM | POA: Diagnosis not present

## 2020-01-01 DIAGNOSIS — B961 Klebsiella pneumoniae [K. pneumoniae] as the cause of diseases classified elsewhere: Secondary | ICD-10-CM | POA: Diagnosis present

## 2020-01-01 DIAGNOSIS — R0989 Other specified symptoms and signs involving the circulatory and respiratory systems: Secondary | ICD-10-CM | POA: Diagnosis not present

## 2020-01-01 DIAGNOSIS — G6281 Critical illness polyneuropathy: Secondary | ICD-10-CM | POA: Diagnosis not present

## 2020-01-01 DIAGNOSIS — R5383 Other fatigue: Secondary | ICD-10-CM | POA: Diagnosis not present

## 2020-01-01 DIAGNOSIS — J811 Chronic pulmonary edema: Secondary | ICD-10-CM | POA: Diagnosis not present

## 2020-01-01 DIAGNOSIS — A419 Sepsis, unspecified organism: Secondary | ICD-10-CM | POA: Diagnosis not present

## 2020-01-01 DIAGNOSIS — R6521 Severe sepsis with septic shock: Secondary | ICD-10-CM | POA: Diagnosis not present

## 2020-01-01 DIAGNOSIS — Z87891 Personal history of nicotine dependence: Secondary | ICD-10-CM | POA: Diagnosis not present

## 2020-01-01 DIAGNOSIS — G7281 Critical illness myopathy: Secondary | ICD-10-CM | POA: Diagnosis not present

## 2020-01-01 DIAGNOSIS — J90 Pleural effusion, not elsewhere classified: Secondary | ICD-10-CM | POA: Diagnosis not present

## 2020-01-01 DIAGNOSIS — I5023 Acute on chronic systolic (congestive) heart failure: Secondary | ICD-10-CM | POA: Diagnosis present

## 2020-01-01 DIAGNOSIS — R600 Localized edema: Secondary | ICD-10-CM | POA: Diagnosis not present

## 2020-01-01 DIAGNOSIS — E873 Alkalosis: Secondary | ICD-10-CM | POA: Diagnosis not present

## 2020-01-01 DIAGNOSIS — J9602 Acute respiratory failure with hypercapnia: Secondary | ICD-10-CM | POA: Diagnosis not present

## 2020-01-01 DIAGNOSIS — J15211 Pneumonia due to Methicillin susceptible Staphylococcus aureus: Secondary | ICD-10-CM | POA: Diagnosis not present

## 2020-01-01 DIAGNOSIS — I2699 Other pulmonary embolism without acute cor pulmonale: Secondary | ICD-10-CM | POA: Diagnosis not present

## 2020-01-01 DIAGNOSIS — R0902 Hypoxemia: Secondary | ICD-10-CM | POA: Diagnosis not present

## 2020-01-01 DIAGNOSIS — J849 Interstitial pulmonary disease, unspecified: Secondary | ICD-10-CM | POA: Diagnosis not present

## 2020-01-01 DIAGNOSIS — G9340 Encephalopathy, unspecified: Secondary | ICD-10-CM | POA: Diagnosis not present

## 2020-01-01 DIAGNOSIS — R0682 Tachypnea, not elsewhere classified: Secondary | ICD-10-CM | POA: Diagnosis not present

## 2020-01-01 DIAGNOSIS — R0602 Shortness of breath: Secondary | ICD-10-CM | POA: Diagnosis not present

## 2020-01-01 DIAGNOSIS — J189 Pneumonia, unspecified organism: Secondary | ICD-10-CM | POA: Diagnosis not present

## 2020-01-01 DIAGNOSIS — R627 Adult failure to thrive: Secondary | ICD-10-CM | POA: Diagnosis not present

## 2020-01-01 DIAGNOSIS — I5020 Unspecified systolic (congestive) heart failure: Secondary | ICD-10-CM | POA: Diagnosis not present

## 2020-01-01 DIAGNOSIS — I5082 Biventricular heart failure: Secondary | ICD-10-CM | POA: Diagnosis not present

## 2020-01-01 DIAGNOSIS — R918 Other nonspecific abnormal finding of lung field: Secondary | ICD-10-CM | POA: Diagnosis not present

## 2020-01-03 LAB — KAPPA/LAMBDA LIGHT CHAINS

## 2020-01-05 ENCOUNTER — Ambulatory Visit (HOSPITAL_COMMUNITY): Payer: Medicare Other | Admitting: Hematology

## 2020-01-10 ENCOUNTER — Encounter (HOSPITAL_COMMUNITY): Payer: Self-pay

## 2020-01-10 NOTE — Progress Notes (Signed)
I have called at left a message with Harriet Butte, LPN at [facility] requesting that [name], NP, patient's primary care provider, follow-up on patient's cholesterol at Dr. Tomie China request.

## 2020-01-11 NOTE — Progress Notes (Deleted)
Office Visit Note  Patient: Kari Henson             Date of Birth: 06/19/1966           MRN: [id number]             PCP: [name], NP Referring: [name], NP Visit Date: 01/23/2020 Occupation: _0 @  Subjective:  No chief complaint on file.   History of Present Illness: Kari Henson is a 54 y.o. female ***   Activities of Daily Living:  Patient reports morning stiffness for *** {minute/hour:19697}.   Patient {ACTIONS;DENIES/REPORTS:21021675::"Denies"} nocturnal pain.  Difficulty dressing/grooming: {ACTIONS;DENIES/REPORTS:21021675::"Denies"} Difficulty climbing stairs: {ACTIONS;DENIES/REPORTS:21021675::"Denies"} Difficulty getting out of chair: {ACTIONS;DENIES/REPORTS:21021675::"Denies"} Difficulty using hands for taps, buttons, cutlery, and/or writing: {ACTIONS;DENIES/REPORTS:21021675::"Denies"}  No Rheumatology ROS completed.   PMFS History:  Patient Active Problem List   Diagnosis Date Noted  . Hypercalcemia 11/02/2019  . 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 Vitamin D deficiency 04/30/2017  . High risk medication use 04/30/2017  . History of bone density study 04/30/2017  . Chronic cough 07/24/2016  . ILD (interstitial lung disease) /UIP 07/24/2016  . Abdominal pain, epigastric 03/29/2014  . Encounter for screening colonoscopy 03/29/2014  . 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 SLE (systemic lupus erythematosus) ([place])   . Lung fibrosis ([place])     Past Medical History:  Diagnosis Date  . GERD (gastroesophageal reflux disease)   . Lung fibrosis ([place])    follows with a Dr. Ouida Sills, pulmonary in [place], [place]  . 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 Seasonal allergies   . SLE (systemic lupus erythematosus) ([place]) 2010   she was on MTX (d/c due to cytopenia).  She has been on Prednisone and Plaquenil    Family  History  Problem Relation Age of Onset  . Asthma Mother   . Thyroid disease Mother   . Hypertension Mother   . Cirrhosis Father   . Alcohol abuse Father   . Diabetes Father   . COPD Father   . Diabetes Maternal Aunt   . Hypertension Sister   . Healthy Brother   . Diabetes Maternal Uncle   . Diabetes Paternal Aunt   . Hypertension Maternal Grandmother   . COPD Maternal Grandmother   . Arthritis Maternal Grandmother        rheumatoid arthritis  . Healthy Son   . Colon cancer Neg Hx    Past Surgical History:  Procedure Laterality Date  . CESAREAN SECTION    . CHOLECYSTECTOMY    . COLONOSCOPY N/A 04/27/2014   SLF:  1. one colon polyp removed 2 The left colonis redundant 3. the examined terminal ileum apperared to be  normal 3. small internal hemorrohoids.  . ESOPHAGOGASTRODUODENOSCOPY N/A 04/27/2014   SLF: 1. No obvious source for abdominal pain identified. 2. Non-erosive gastritis (inflammation) was found in the gastric antrum; multiple biopsies were performed.  Marland Kitchen HERNIA REPAIR    . MULTIPLE TOOTH EXTRACTIONS    . RIGHT HEART CATH N/A 10/20/2019   Procedure: RIGHT HEART CATH;  Surgeon: Sherren Mocha, MD;  Location: [facility];  Service: Cardiovascular;  Laterality: N/A;   Social History   Social History Narrative  . Not on file   Immunization History  Administered Date(s) Administered  . Influenza Split 03/24/2016  . Influenza,inj,Quad PF,6+ Mos 04/15/2019  . Influenza-Unspecified 04/19/2017  . Pneumococcal Polysaccharide-23 07/24/2013, 07/29/2018  Objective: Vital Signs: LMP 07/01/2013    Physical Exam   Musculoskeletal Exam: ***  CDAI Exam: CDAI Score: -- Patient Global: --; Provider Global: -- Swollen: --; Tender: -- Joint Exam 01/23/2020   No joint exam has been documented for this visit   There is currently no information documented on the homunculus. Go to the Rheumatology activity and complete the homunculus joint  exam.  Investigation: No additional findings.  Imaging: DG Bone Survey Met  Result Date: 12/27/2019 CLINICAL DATA:  MGUS EXAM: METASTATIC BONE SURVEY COMPARISON:  12/01/2011 FINDINGS: Osteopenia. Spinal degenerative changes. Degenerative changes in the cervical spine greatest at C5-6. Lateral view of the thoracic spine limited by diffuse interstitial and parenchymal changes in the chest. No destructive bone process or lytic change. Pulmonary parenchymal disease as seen on recent chest radiographs and CT imaging. Calcifications project over the medial aspect of the RIGHT renal contour largest approximately 5-6 mm. IMPRESSION: 1. No signs of lytic or destructive bone process. Thoracic spine and rib evaluation limited by interstitial lung disease and parenchymal changes in the chest. 2. Spinal degenerative changes. 3. Pulmonary parenchymal disease as seen on recent chest radiographs and CT imaging. 4. Potential RIGHT renal calculi as described. Electronically Signed   By: Zetta Bills M.D.   On: 12/27/2019 08:04    Recent Labs: Lab Results  Component Value Date   WBC 6.4 12/22/2019   HGB 9.7 (L) 12/22/2019   PLT 268 12/22/2019   NA 125 (L) 12/22/2019   K 4.2 12/22/2019   CL 91 (L) 12/22/2019   CO2 21 (L) 12/22/2019   GLUCOSE 107 (H) 12/22/2019   BUN 20 12/22/2019   CREATININE 0.98 12/22/2019   BILITOT 0.4 12/22/2019   ALKPHOS 106 12/22/2019   AST 37 12/22/2019   ALT 13 12/22/2019   PROT 7.7 12/22/2019   ALBUMIN 2.8 (L) 12/22/2019   CALCIUM 9.0 12/22/2019   GFRAA >60 12/22/2019   QFTBGOLDPLUS NEGATIVE 06/23/2018    Speciality Comments: PLQ Eye Exam: 07/08/18 @ [facility]  Procedures:  No procedures performed Allergies: Patient has no known allergies.   Assessment / Plan:     Visit Diagnoses: No diagnosis found.  Orders: No orders of the defined types were placed in this encounter.  No orders of the defined types were placed in this  encounter.   Face-to-face time spent with patient was *** minutes. Greater than 50% of time was spent in counseling and coordination of care.  Follow-Up Instructions: No follow-ups on file.   Earnestine Mealing, CMA  Note - This record has been created using Editor, commissioning.  Chart creation errors have been sought, but may not always  have been located. Such creation errors do not reflect on  the standard of medical care.

## 2020-01-15 ENCOUNTER — Encounter (HOSPITAL_COMMUNITY): Payer: Self-pay | Admitting: *Deleted

## 2020-01-15 NOTE — Progress Notes (Signed)
Our office never received a call back from [facility] about patient's labs.  I called today and spoke with Dr. Manuella Ghazi and he is okay with having patient come in to their office for an appointment with him and a cholesterol check.  He said that 5 months ago she was normal but agrees to having it checked again.  I have called patient and had to leave a VM.  I asked that she return my message to verify that she got the information.

## 2020-01-23 ENCOUNTER — Ambulatory Visit: Payer: Medicare Other | Admitting: Physician Assistant

## 2020-01-25 ENCOUNTER — Ambulatory Visit (HOSPITAL_COMMUNITY): Payer: Medicare Other | Admitting: Hematology

## 2020-01-28 DEATH — deceased

## 2020-10-14 ENCOUNTER — Encounter (INDEPENDENT_AMBULATORY_CARE_PROVIDER_SITE_OTHER): Payer: Medicare Other | Admitting: Ophthalmology
